# Patient Record
Sex: Female | Born: 1950
Health system: Southern US, Community
[De-identification: ages and names within clinical notes are randomized; demographics above are authoritative.]

## PROBLEM LIST (undated history)

## (undated) DIAGNOSIS — K5900 Constipation, unspecified: Secondary | ICD-10-CM

## (undated) DIAGNOSIS — R350 Frequency of micturition: Secondary | ICD-10-CM

## (undated) DIAGNOSIS — K219 Gastro-esophageal reflux disease without esophagitis: Secondary | ICD-10-CM

## (undated) DIAGNOSIS — R3915 Urgency of urination: Secondary | ICD-10-CM

## (undated) DIAGNOSIS — Z951 Presence of aortocoronary bypass graft: Secondary | ICD-10-CM

## (undated) DIAGNOSIS — R351 Nocturia: Secondary | ICD-10-CM

## (undated) DIAGNOSIS — G4730 Sleep apnea, unspecified: Secondary | ICD-10-CM

## (undated) DIAGNOSIS — I671 Cerebral aneurysm, nonruptured: Secondary | ICD-10-CM

## (undated) DIAGNOSIS — I639 Cerebral infarction, unspecified: Secondary | ICD-10-CM

## (undated) DIAGNOSIS — Z87442 Personal history of urinary calculi: Secondary | ICD-10-CM

## (undated) DIAGNOSIS — E785 Hyperlipidemia, unspecified: Secondary | ICD-10-CM

## (undated) DIAGNOSIS — H269 Unspecified cataract: Secondary | ICD-10-CM

## (undated) DIAGNOSIS — I5189 Other ill-defined heart diseases: Secondary | ICD-10-CM

## (undated) DIAGNOSIS — Z860100 Personal history of colon polyps, unspecified: Secondary | ICD-10-CM

## (undated) DIAGNOSIS — G4700 Insomnia, unspecified: Secondary | ICD-10-CM

## (undated) DIAGNOSIS — D151 Benign neoplasm of heart: Secondary | ICD-10-CM

## (undated) DIAGNOSIS — IMO0001 Reserved for inherently not codable concepts without codable children: Secondary | ICD-10-CM

## (undated) DIAGNOSIS — I2510 Atherosclerotic heart disease of native coronary artery without angina pectoris: Secondary | ICD-10-CM

## (undated) DIAGNOSIS — R202 Paresthesia of skin: Secondary | ICD-10-CM

## (undated) DIAGNOSIS — I10 Essential (primary) hypertension: Secondary | ICD-10-CM

## (undated) HISTORY — PX: ABDOMINAL HYSTERECTOMY: SHX81

## (undated) HISTORY — PX: COLONOSCOPY: SHX174

---

## 2006-03-13 ENCOUNTER — Other Ambulatory Visit: Admission: RE | Admit: 2006-03-13 | Discharge: 2006-03-13 | Payer: Self-pay | Admitting: Family Medicine

## 2014-07-01 ENCOUNTER — Encounter (HOSPITAL_COMMUNITY): Payer: Self-pay | Admitting: Neurology

## 2014-07-01 ENCOUNTER — Inpatient Hospital Stay (HOSPITAL_COMMUNITY): Payer: BC Managed Care – PPO

## 2014-07-01 ENCOUNTER — Inpatient Hospital Stay (HOSPITAL_COMMUNITY)
Admission: EM | Admit: 2014-07-01 | Discharge: 2014-07-14 | DRG: 982 | Disposition: A | Discharge status: Home / Self Care | Payer: BC Managed Care – PPO | Attending: Thoracic Surgery (Cardiothoracic Vascular Surgery) | Admitting: Thoracic Surgery (Cardiothoracic Vascular Surgery)

## 2014-07-01 ENCOUNTER — Emergency Department (HOSPITAL_COMMUNITY): Payer: BC Managed Care – PPO

## 2014-07-01 DIAGNOSIS — I63419 Cerebral infarction due to embolism of unspecified middle cerebral artery: Principal | ICD-10-CM | POA: Diagnosis present

## 2014-07-01 DIAGNOSIS — I2510 Atherosclerotic heart disease of native coronary artery without angina pectoris: Secondary | ICD-10-CM

## 2014-07-01 DIAGNOSIS — I10 Essential (primary) hypertension: Secondary | ICD-10-CM | POA: Diagnosis present

## 2014-07-01 DIAGNOSIS — D219 Benign neoplasm of connective and other soft tissue, unspecified: Secondary | ICD-10-CM

## 2014-07-01 DIAGNOSIS — I5189 Other ill-defined heart diseases: Secondary | ICD-10-CM | POA: Diagnosis present

## 2014-07-01 DIAGNOSIS — Q211 Atrial septal defect: Secondary | ICD-10-CM

## 2014-07-01 DIAGNOSIS — I700 Atherosclerosis of aorta: Secondary | ICD-10-CM | POA: Diagnosis present

## 2014-07-01 DIAGNOSIS — I671 Cerebral aneurysm, nonruptured: Secondary | ICD-10-CM | POA: Diagnosis present

## 2014-07-01 DIAGNOSIS — Z720 Tobacco use: Secondary | ICD-10-CM | POA: Diagnosis not present

## 2014-07-01 DIAGNOSIS — E669 Obesity, unspecified: Secondary | ICD-10-CM | POA: Diagnosis present

## 2014-07-01 DIAGNOSIS — D62 Acute posthemorrhagic anemia: Secondary | ICD-10-CM | POA: Diagnosis not present

## 2014-07-01 DIAGNOSIS — J9811 Atelectasis: Secondary | ICD-10-CM

## 2014-07-01 DIAGNOSIS — Z7982 Long term (current) use of aspirin: Secondary | ICD-10-CM

## 2014-07-01 DIAGNOSIS — D151 Benign neoplasm of heart: Secondary | ICD-10-CM | POA: Diagnosis present

## 2014-07-01 DIAGNOSIS — Z01818 Encounter for other preprocedural examination: Secondary | ICD-10-CM

## 2014-07-01 DIAGNOSIS — IMO0002 Reserved for concepts with insufficient information to code with codable children: Secondary | ICD-10-CM

## 2014-07-01 DIAGNOSIS — R229 Localized swelling, mass and lump, unspecified: Secondary | ICD-10-CM

## 2014-07-01 DIAGNOSIS — Z951 Presence of aortocoronary bypass graft: Secondary | ICD-10-CM

## 2014-07-01 DIAGNOSIS — I639 Cerebral infarction, unspecified: Secondary | ICD-10-CM

## 2014-07-01 DIAGNOSIS — Z6833 Body mass index (BMI) 33.0-33.9, adult: Secondary | ICD-10-CM | POA: Diagnosis not present

## 2014-07-01 DIAGNOSIS — R938 Abnormal findings on diagnostic imaging of other specified body structures: Secondary | ICD-10-CM | POA: Diagnosis not present

## 2014-07-01 DIAGNOSIS — E8770 Fluid overload, unspecified: Secondary | ICD-10-CM | POA: Diagnosis not present

## 2014-07-01 DIAGNOSIS — Z01810 Encounter for preprocedural cardiovascular examination: Secondary | ICD-10-CM

## 2014-07-01 DIAGNOSIS — E785 Hyperlipidemia, unspecified: Secondary | ICD-10-CM | POA: Diagnosis present

## 2014-07-01 DIAGNOSIS — R918 Other nonspecific abnormal finding of lung field: Secondary | ICD-10-CM

## 2014-07-01 DIAGNOSIS — R7309 Other abnormal glucose: Secondary | ICD-10-CM | POA: Diagnosis present

## 2014-07-01 DIAGNOSIS — R531 Weakness: Secondary | ICD-10-CM | POA: Diagnosis present

## 2014-07-01 DIAGNOSIS — I6789 Other cerebrovascular disease: Secondary | ICD-10-CM | POA: Diagnosis not present

## 2014-07-01 DIAGNOSIS — R7303 Prediabetes: Secondary | ICD-10-CM | POA: Diagnosis present

## 2014-07-01 DIAGNOSIS — F17210 Nicotine dependence, cigarettes, uncomplicated: Secondary | ICD-10-CM | POA: Diagnosis present

## 2014-07-01 DIAGNOSIS — R9389 Abnormal findings on diagnostic imaging of other specified body structures: Secondary | ICD-10-CM | POA: Diagnosis present

## 2014-07-01 DIAGNOSIS — R222 Localized swelling, mass and lump, trunk: Secondary | ICD-10-CM | POA: Diagnosis not present

## 2014-07-01 DIAGNOSIS — I25110 Atherosclerotic heart disease of native coronary artery with unstable angina pectoris: Secondary | ICD-10-CM | POA: Diagnosis not present

## 2014-07-01 HISTORY — DX: Cerebral aneurysm, nonruptured: I67.1

## 2014-07-01 HISTORY — DX: Cerebral infarction, unspecified: I63.9

## 2014-07-01 HISTORY — DX: Hyperlipidemia, unspecified: E78.5

## 2014-07-01 HISTORY — DX: Other ill-defined heart diseases: I51.89

## 2014-07-01 HISTORY — DX: Essential (primary) hypertension: I10

## 2014-07-01 HISTORY — DX: Benign neoplasm of heart: D15.1

## 2014-07-01 HISTORY — DX: Atherosclerotic heart disease of native coronary artery without angina pectoris: I25.10

## 2014-07-01 HISTORY — DX: Presence of aortocoronary bypass graft: Z95.1

## 2014-07-01 LAB — DIFFERENTIAL
BASOS PCT: 1 % (ref 0–1)
Basophils Absolute: 0.1 10*3/uL (ref 0.0–0.1)
EOS PCT: 1 % (ref 0–5)
Eosinophils Absolute: 0.1 10*3/uL (ref 0.0–0.7)
Lymphocytes Relative: 24 % (ref 12–46)
Lymphs Abs: 2.5 10*3/uL (ref 0.7–4.0)
MONO ABS: 0.6 10*3/uL (ref 0.1–1.0)
MONOS PCT: 6 % (ref 3–12)
Neutro Abs: 7 10*3/uL (ref 1.7–7.7)
Neutrophils Relative %: 68 % (ref 43–77)

## 2014-07-01 LAB — COMPREHENSIVE METABOLIC PANEL
ALBUMIN: 3.6 g/dL (ref 3.5–5.2)
ALK PHOS: 77 U/L (ref 39–117)
ALT: 19 U/L (ref 0–35)
AST: 25 U/L (ref 0–37)
Anion gap: 9 (ref 5–15)
BILIRUBIN TOTAL: 0.8 mg/dL (ref 0.3–1.2)
BUN: 15 mg/dL (ref 6–23)
CHLORIDE: 101 mmol/L (ref 96–112)
CO2: 27 mmol/L (ref 19–32)
Calcium: 9.2 mg/dL (ref 8.4–10.5)
Creatinine, Ser: 0.73 mg/dL (ref 0.50–1.10)
GFR calc Af Amer: >90 mL/min (ref >90)
GFR calc non Af Amer: 88 mL/min — ABNORMAL LOW (ref >90)
GLUCOSE: 99 mg/dL (ref 70–99)
POTASSIUM: 4.3 mmol/L (ref 3.5–5.1)
SODIUM: 137 mmol/L (ref 135–145)
Total Protein: 6.4 g/dL (ref 6.0–8.3)

## 2014-07-01 LAB — CBC
HEMATOCRIT: 43.4 % (ref 36.0–46.0)
HEMOGLOBIN: 15.1 g/dL — AB (ref 12.0–15.0)
MCH: 32 pg (ref 26.0–34.0)
MCHC: 34.8 g/dL (ref 30.0–36.0)
MCV: 91.9 fL (ref 78.0–100.0)
Platelets: 321 10*3/uL (ref 150–400)
RBC: 4.72 MIL/uL (ref 3.87–5.11)
RDW: 13.6 % (ref 11.5–15.5)
WBC: 10.3 10*3/uL (ref 4.0–10.5)

## 2014-07-01 LAB — GLUCOSE, CAPILLARY: GLUCOSE-CAPILLARY: 178 mg/dL — AB (ref 70–99)

## 2014-07-01 LAB — URINALYSIS, ROUTINE W REFLEX MICROSCOPIC
Bilirubin Urine: NEGATIVE
GLUCOSE, UA: NEGATIVE mg/dL
HGB URINE DIPSTICK: NEGATIVE
Ketones, ur: NEGATIVE mg/dL
LEUKOCYTES UA: NEGATIVE
NITRITE: NEGATIVE
PROTEIN: NEGATIVE mg/dL
Specific Gravity, Urine: 1.012 (ref 1.005–1.030)
UROBILINOGEN UA: 0.2 mg/dL (ref 0.0–1.0)
pH: 6 (ref 5.0–8.0)

## 2014-07-01 LAB — APTT: APTT: 30 s (ref 24–37)

## 2014-07-01 LAB — RAPID URINE DRUG SCREEN, HOSP PERFORMED
Amphetamines: NOT DETECTED
BARBITURATES: NOT DETECTED
Benzodiazepines: NOT DETECTED
Cocaine: NOT DETECTED
OPIATES: NOT DETECTED
TETRAHYDROCANNABINOL: NOT DETECTED

## 2014-07-01 LAB — PROTIME-INR
INR: 0.95 (ref 0.00–1.49)
Prothrombin Time: 12.8 seconds (ref 11.6–15.2)

## 2014-07-01 LAB — TROPONIN I: Troponin I: <0.03 ng/mL (ref <0.031)

## 2014-07-01 LAB — ETHANOL

## 2014-07-01 MED ORDER — SENNOSIDES-DOCUSATE SODIUM 8.6-50 MG PO TABS
1.0000 | ORAL_TABLET | Freq: Every day | ORAL | Status: DC
  Administered 2014-07-03 – 2014-07-09 (×6): 1 via ORAL
  Filled 2014-07-01 (×9): qty 1

## 2014-07-01 MED ORDER — STROKE: EARLY STAGES OF RECOVERY BOOK
Freq: Once | Status: DC
  Filled 2014-07-01: qty 1

## 2014-07-01 MED ORDER — ENOXAPARIN SODIUM 40 MG/0.4ML ~~LOC~~ SOLN
40.0000 mg | SUBCUTANEOUS | Status: DC
  Administered 2014-07-01 – 2014-07-04 (×4): 40 mg via SUBCUTANEOUS
  Filled 2014-07-01 (×5): qty 0.4

## 2014-07-01 MED ORDER — SODIUM CHLORIDE 0.9 % IV SOLN
INTRAVENOUS | Status: DC
  Administered 2014-07-01: 14:00:00 via INTRAVENOUS

## 2014-07-01 MED ORDER — ACETAMINOPHEN 325 MG PO TABS: 650.0000 mg | ORAL_TABLET | Freq: Four times a day (QID) | ORAL | Status: DC | PRN

## 2014-07-01 MED ORDER — ALBUTEROL SULFATE (2.5 MG/3ML) 0.083% IN NEBU
2.5000 mg | INHALATION_SOLUTION | RESPIRATORY_TRACT | Status: DC | PRN
  Administered 2014-07-07: 2.5 mg via RESPIRATORY_TRACT

## 2014-07-01 MED ORDER — ONDANSETRON HCL 4 MG/2ML IJ SOLN: 4.0000 mg | Freq: Four times a day (QID) | INTRAMUSCULAR | Status: DC | PRN

## 2014-07-01 MED ORDER — ASPIRIN 325 MG PO TABS
325.0000 mg | ORAL_TABLET | Freq: Every day | ORAL | Status: DC
  Administered 2014-07-01 – 2014-07-06 (×5): 325 mg via ORAL
  Filled 2014-07-01 (×5): qty 1

## 2014-07-01 MED ORDER — AMLODIPINE BESYLATE 2.5 MG PO TABS
2.5000 mg | ORAL_TABLET | Freq: Every day | ORAL | Status: DC
  Administered 2014-07-02 – 2014-07-06 (×5): 2.5 mg via ORAL
  Filled 2014-07-01 (×5): qty 1

## 2014-07-01 MED ORDER — HYDRALAZINE HCL 20 MG/ML IJ SOLN: 10.0000 mg | Freq: Four times a day (QID) | INTRAMUSCULAR | Status: DC | PRN

## 2014-07-01 MED ORDER — ASPIRIN 300 MG RE SUPP: 300.0000 mg | Freq: Every day | RECTAL | Status: DC

## 2014-07-01 MED ORDER — OXYBUTYNIN CHLORIDE ER 10 MG PO TB24
10.0000 mg | ORAL_TABLET | Freq: Every day | ORAL | Status: DC
  Administered 2014-07-02 – 2014-07-09 (×9): 10 mg via ORAL
  Filled 2014-07-01 (×13): qty 1

## 2014-07-01 NOTE — ED Notes (Signed)
Neuro at bedside.

## 2014-07-01 NOTE — H&P (Signed)
[facility] History and Physical                                                                                    Hannah Nielsen, is a 64 y.o. female  MRN: [id number]   DOB - January 06, 1951  Admit Date - 07/01/2014  Outpatient Primary MD for the patient is No primary care provider on file.  With History of -  Past Medical History  Diagnosis Date  . Hypertension       Past Surgical History  Procedure Laterality Date  . Abdominal hysterectomy      in for   Chief Complaint  Patient presents with  . Numbness     HPI  Hannah Nielsen  is a 64 y.o. female, with a history of hypertension and tobacco abuse presented to the ED with left sided upper extremity weakness and numbness that started this morning at 2am. Patient went to bed at 10pm with no symptoms and woke at 2am with left arm numbness. She attributed it to sleeping on it and went back to bed. She woke again at 4:30 with numbness and now could not grasp objects. She drove herself to the ED. She denies any changes in mood. Denies vision changes, chest pain, shortness of breath, nausea, vomiting, paresthesia in the lower extremity or right upper extremity, and headache.   In the ED, MRI of head showed small area of acute infarct on right precentral cortex over convexity WITHOUT hemorrhage. UA and UDS negative. Negative troponin. NSR on EKG. States that her numbness is now localized to the 4th and 5th left fingers and can now grasp objects. Strength appears to be close to baseline.   Review of Systems   In addition to the HPI above,  Review of Systems  Constitutional: Negative for chills.  Eyes: Negative for blurred vision and double vision.  Respiratory: Negative for cough.        Chronic dyspnea on exertion   Cardiovascular: Negative for chest pain and palpitations.  Gastrointestinal: Negative for nausea, vomiting, abdominal pain and diarrhea.  Musculoskeletal: Negative for myalgias.  Neurological: Positive for tingling  (left upper extremity (especially in fingers 4 and 5)) and weakness (left upper extremity). Negative for dizziness, tremors, speech change, loss of consciousness and headaches.    A full 10 point Review of Systems was done, except as stated above, all other Review of Systems were negative.  Social History History  Substance Use Topics  . Smoking status: Current Every Day Smoker  . Smokeless tobacco: Not on file  . Alcohol Use: Yes    Family History Family History  Problem Relation Age of Onset  . Hypertension Mother   . Heart failure Mother     Deceased  . Hypertension Sister   . Hypertension Brother   . Cerebral aneurysm Father 39    Deceased    Prior to Admission medications   Medication Sig Start Date End Date Taking? Authorizing Provider  acetaminophen (TYLENOL) 500 MG tablet Take 500-1,000 mg by mouth every 6 (six) hours as needed for mild pain or moderate pain.   Yes Historical Provider, MD  amLODipine (NORVASC) 2.5 MG tablet Take 2.5  mg by mouth at bedtime.    Yes Historical Provider, MD  aspirin 81 MG tablet Take 81 mg by mouth daily.   Yes Historical Provider, MD  CALCIUM PO Take 1 tablet by mouth daily.   Yes Historical Provider, MD  cetirizine (ZYRTEC) 10 MG tablet Take 10 mg by mouth at bedtime.   Yes Historical Provider, MD  hydrochlorothiazide (MICROZIDE) 12.5 MG capsule Take 12.5 mg by mouth daily.   Yes Historical Provider, MD  KRILL OIL PO Take 1 tablet by mouth daily.   Yes Historical Provider, MD  lisinopril (PRINIVIL,ZESTRIL) 10 MG tablet Take 10 mg by mouth at bedtime.    Yes Historical Provider, MD  LUTEIN PO Take 1 tablet by mouth daily.   Yes Historical Provider, MD  Multiple Vitamins-Minerals (MULTIVITAL) tablet Take 1 tablet by mouth daily.   Yes Historical Provider, MD  oxybutynin (DITROPAN-XL) 10 MG 24 hr tablet Take 10 mg by mouth at bedtime.   Yes Historical Provider, MD    Allergies  Allergen Reactions  . Sulfa Antibiotics     Physical  Exam  Vitals  Blood pressure 121/67, pulse 74, temperature 97.9 F (36.6 C), temperature source Oral, resp. rate 23, weight 83.915 kg (185 lb), SpO2 93 %.   General: Well developed, well nourished, NAD, appears stated age  24:  PERRLA, EOMI, Anicteic Sclera, MMM. No pharyngeal erythema or exudates  Neck: Supple, no JVD, no masses  Cardiovascular: RRR, S1 S2 auscultated, no rubs, murmurs or gallops.   Respiratory: Clear to auscultation bilaterally with equal chest rise  Abdomen: Soft, nontender, nondistended, + bowel sounds  Extremities: warm dry without cyanosis clubbing or edema.  Neuro: AAOx3, cranial nerves grossly intact. Strength 5/5 in B/L upper and B/L lower extremities. No pronator drift. Good coordination with finger, nose, finger test. No ataxia with point to point test.  Skin: Without rashes exudates or nodules.   Psych: Normal affect and demeanor with intact judgement and insight  Data Review  CBC  Recent Labs Lab 07/01/14 0810  WBC 10.3  HGB 15.1*  HCT 43.4  PLT 321  MCV 91.9  MCH 32.0  MCHC 34.8  RDW 13.6  LYMPHSABS 2.5  MONOABS 0.6  EOSABS 0.1  BASOSABS 0.1    Chemistries   Recent Labs Lab 07/01/14 0810  NA 137  K 4.3  CL 101  CO2 27  GLUCOSE 99  BUN 15  CREATININE 0.73  CALCIUM 9.2  AST 25  ALT 19  ALKPHOS 77  BILITOT 0.8    Coagulation profile  Recent Labs Lab 07/01/14 0810  INR 0.95    Cardiac Enzymes  Recent Labs Lab 07/01/14 0810  TROPONINI <0.03    Invalid input(s): POCBNP  Urinalysis    Component Value Date/Time   COLORURINE YELLOW 07/01/2014 0821   APPEARANCEUR CLEAR 07/01/2014 0821   LABSPEC 1.012 07/01/2014 0821   PHURINE 6.0 07/01/2014 0821   GLUCOSEU NEGATIVE 07/01/2014 0821   HGBUR NEGATIVE 07/01/2014 0821   BILIRUBINUR NEGATIVE 07/01/2014 0821   KETONESUR NEGATIVE 07/01/2014 0821   PROTEINUR NEGATIVE 07/01/2014 0821   UROBILINOGEN 0.2 07/01/2014 0821   NITRITE NEGATIVE 07/01/2014 0821    LEUKOCYTESUR NEGATIVE 07/01/2014 8115    Imaging results:   Mr Brain Wo Contrast  07/01/2014   CLINICAL DATA:  Left arm weakness and tingling began 2 a.m. today.  EXAM: MRI HEAD WITHOUT CONTRAST  TECHNIQUE: Multiplanar, multiecho pulse sequences of the brain and surrounding structures were obtained without intravenous contrast.  COMPARISON:  None.  FINDINGS: Small area of acute infarct in the right precentral cortex over the convexity. This appears to be in the precentral cortex with scattered small areas of patchy restricted diffusion. No other acute infarct.  Ventricle size normal.  Cerebral volume normal.  Negative for intracranial hemorrhage.  Negative for mass or edema.  Paranasal sinuses are clear.  Enlargement of the sella which is filled with CSF. Possible arachnoid cyst versus empty sella. Pituitary normal in size.  IMPRESSION: Small area of acute infarct right precentral cortex over the convexity. Negative for hemorrhage.   Electronically Signed   By: Franchot Gallo M.D.   On: 07/01/2014 10:00    My personal review of EKG: NSR, No ST changes noted.   Assessment & Plan  Principal Problem:   Stroke Active Problems:   Hypertension   Tobacco abuse  1. Acute CVA  - Symptoms started this morning at 2am; have significantly improved since coming to the ED - MRI of head showed small area of acute infarct on right precentral cortex over convexity WITHOUT hemorrhage.  - Will allow for permissive hypertension, monitor, hold home meds, restart amlodipine tomorrow. - Neurology consulted, appreciate input  - Continue neuro checks and monitor vital signs  - Obtain CXR, CT, carotid duplex, 2D ECHO  - OT, PT, SLP  - Increased ASA from 81mg  to 325mg   - After RN swallow screen, may advance diet  - Follow stroke protocol  2. Hypertension  - Will hold home medications today to allow for permissive hypertension up to SBP 180.  - Restart amlodipine tomorrow; continue holding lisinopril and  HCTZ. - Obtain lipid panel  - Hydralazine if SBP >200  3. Tobacco abuse - 51 pack year history.  - Counseling given; will consider quitting after hospitalization.  - Informed to not take her lozenges due to potential for vasospasm.   DVT Prophylaxis: Lovenox  AM Labs Ordered, also please review Full Orders  Family Communication: Discussed with patient    Code Status:  Full   Condition:  Stable  Time spent in minutes : [place], PA-S [facility] 07/01/2014, 11:18 AM After 7pm go to [email or web address] - password [email or web address]  And look for the night coverage person covering me after hours  [facility]  Attending Patient was seen, examined,treatment plan was discussed with the  PA-S.  I have directly reviewed the clinical findings, lab, imaging studies and management of this patient in detail. I have made the necessary changes to the above noted documentation, and agree with the documentation, as recorded by the PA-S.  In short, 64 year old female with history of hypertension and tobacco abuse, woke up this morning with left arm tingling/numbness and some mild weakness of her left hand-couldn't hold onto a phone. She thought that she had slept on her left hand and went back to sleep, when she awoke at 4 AM, she continued to have the symptoms. She subsequently presented to the emergency room, MRI of the brain was positive for small CVA. While in the emergency room, almost all of her deficits have now resolved. On exam she has 5/5 strength all over. Neurology has been consulted, she was already on 81 mg of aspirin prior to admission. She will be admitted for stroke workup, echo, carotids, A1c, lipid panel has been ordered. We will increase aspirin to 325 mg and await further recommendations from neurology.  Nena Alexander MD [facility].

## 2014-07-01 NOTE — Consult Note (Signed)
Referring Physician: Ghimire    Chief Complaint: stroke  HPI:                                                                                                                                         Hannah Nielsen is an 64 y.o. female with history of HTN and tobacco abuse. patient went to sleep last night and awoke at around 2 AM.  She states she realized her left hand felt numb and clumsy.  She went back to sleep and upon waking her left hand continued to have decreased sensation and feel clumsy. She drove herself to ED and MRI was obtained showing a right precentral cortex infarct. Currently she has clumsiness of her left hand and decreased sensation oover the small and ring finger.   Date last known well: Date: 06/30/2014 Time last known well: Unable to determine tPA Given: No: out of the window Modified Rankin: Rankin Score=0    Past Medical History  Diagnosis Date  . Hypertension     Past Surgical History  Procedure Laterality Date  . Abdominal hysterectomy      Family History  Problem Relation Age of Onset  . Hypertension Mother   . Heart failure Mother     Deceased  . Hypertension Sister   . Hypertension Brother   . Cerebral aneurysm Father 56    Deceased   Social History:  reports that she has been smoking.  She does not have any smokeless tobacco history on file. She reports that she drinks alcohol. She reports that she does not use illicit drugs.  Allergies:  Allergies  Allergen Reactions  . Sulfa Antibiotics     Medications:                                                                                                                           Current Facility-Administered Medications  Medication Dose Route Frequency Provider Last Rate Last Dose  .  stroke: mapping our early stages of recovery book   Does not apply Once Jonetta Osgood, MD      . 0.9 %  sodium chloride infusion   Intravenous Continuous Jonetta Osgood, MD 50 mL/hr at 07/01/14 1356    .  acetaminophen (TYLENOL) tablet 650 mg  650 mg Oral Q6H PRN Shanker Kristeen Mans, MD      . albuterol (PROVENTIL) (  2.5 MG/3ML) 0.083% nebulizer solution 2.5 mg  2.5 mg Nebulization Q2H PRN Jonetta Osgood, MD      . Derrill Memo ON 07/02/2014] amLODipine (NORVASC) tablet 2.5 mg  2.5 mg Oral QHS Shanker Kristeen Mans, MD      . aspirin suppository 300 mg  300 mg Rectal Daily Shanker Kristeen Mans, MD       Or  . aspirin tablet 325 mg  325 mg Oral Daily Jonetta Osgood, MD   325 mg at 07/01/14 1355  . enoxaparin (LOVENOX) injection 40 mg  40 mg Subcutaneous Q24H Shanker Kristeen Mans, MD      . hydrALAZINE (APRESOLINE) injection 10 mg  10 mg Intravenous Q6H PRN Shanker Kristeen Mans, MD      . ondansetron (ZOFRAN) injection 4 mg  4 mg Intravenous Q6H PRN Shanker Kristeen Mans, MD      . oxybutynin (DITROPAN-XL) 24 hr tablet 10 mg  10 mg Oral QHS Shanker Kristeen Mans, MD      . senna-docusate (Senokot-S) tablet 1 tablet  1 tablet Oral QHS Shanker Kristeen Mans, MD       Current Outpatient Prescriptions  Medication Sig Dispense Refill  . acetaminophen (TYLENOL) 500 MG tablet Take 500-1,000 mg by mouth every 6 (six) hours as needed for mild pain or moderate pain.    Marland Kitchen amLODipine (NORVASC) 2.5 MG tablet Take 2.5 mg by mouth at bedtime.     Marland Kitchen aspirin 81 MG tablet Take 81 mg by mouth daily.    Marland Kitchen CALCIUM PO Take 1 tablet by mouth daily.    . cetirizine (ZYRTEC) 10 MG tablet Take 10 mg by mouth at bedtime.    . hydrochlorothiazide (MICROZIDE) 12.5 MG capsule Take 12.5 mg by mouth daily.    Marland Kitchen KRILL OIL PO Take 1 tablet by mouth daily.    Marland Kitchen lisinopril (PRINIVIL,ZESTRIL) 10 MG tablet Take 10 mg by mouth at bedtime.     . LUTEIN PO Take 1 tablet by mouth daily.    . Multiple Vitamins-Minerals (MULTIVITAL) tablet Take 1 tablet by mouth daily.    Marland Kitchen oxybutynin (DITROPAN-XL) 10 MG 24 hr tablet Take 10 mg by mouth at bedtime.       ROS:                                                                                                                                        History obtained from the patient  General ROS: negative for - chills, fatigue, fever, night sweats, weight gain or weight loss Psychological ROS: negative for - behavioral disorder, hallucinations, memory difficulties, mood swings or suicidal ideation Ophthalmic ROS: negative for - blurry vision, double vision, eye pain or loss of vision ENT ROS: negative for - epistaxis, nasal discharge, oral lesions, sore throat, tinnitus or vertigo Allergy and Immunology ROS: negative for - hives or itchy/watery eyes Hematological and Lymphatic ROS: negative for -  bleeding problems, bruising or swollen lymph nodes Endocrine ROS: negative for - galactorrhea, hair pattern changes, polydipsia/polyuria or temperature intolerance Respiratory ROS: negative for - cough, hemoptysis, shortness of breath or wheezing Cardiovascular ROS: negative for - chest pain, dyspnea on exertion, edema or irregular heartbeat Gastrointestinal ROS: negative for - abdominal pain, diarrhea, hematemesis, nausea/vomiting or stool incontinence Genito-Urinary ROS: negative for - dysuria, hematuria, incontinence or urinary frequency/urgency Musculoskeletal ROS: negative for - joint swelling or muscular weakness Neurological ROS: as noted in HPI Dermatological ROS: negative for rash and skin lesion changes  Neurologic Examination:                                                                                                      Blood pressure 133/78, pulse 75, temperature 97.9 F (36.6 C), temperature source Oral, resp. rate 21, weight 83.915 kg (185 lb), SpO2 93 %.  HEENT-  Normocephalic, no lesions, without obvious abnormality.  Normal external eye and conjunctiva.  Normal TM's bilaterally.  Normal auditory canals and external ears. Normal external nose, mucus membranes and septum.  Normal pharynx. Cardiovascular- S1, S2 normal, pulses palpable throughout   Lungs- chest clear, no wheezing, rales, normal  symmetric air entry Abdomen- normal findings: bowel sounds normal Extremities- no edema Lymph-no adenopathy palpable Musculoskeletal-no joint tenderness, deformity or swelling Skin-warm and dry, no hyperpigmentation, vitiligo, or suspicious lesions  Neurological Examination Mental Status: Alert, oriented, thought content appropriate.  Speech fluent without evidence of aphasia.  Able to follow 3 step commands without difficulty. Cranial Nerves: II: Discs flat bilaterally; Visual fields grossly normal, pupils equal, round, reactive to light and accommodation III,IV, VI: ptosis not present, extra-ocular motions intact bilaterally V,VII: smile symmetric with slight left NL fold decrease, facial light touch sensation normal bilaterally VIII: hearing normal bilaterally IX,X: uvula rises symmetrically XI: bilateral shoulder shrug XII: midline tongue extension Motor: Right : Upper extremity   5/5    Left:     Upper extremity   5/5--grip 4/5  Lower extremity   5/5     Lower extremity   5/5 Decreased  RAM on the left hand Tone and bulk:normal tone throughout; no atrophy noted Sensory: Pinprick and light touch intact throughout, bilaterally Deep Tendon Reflexes: 2+ and symmetric throughout Plantars: Right: downgoing   Left: downgoing Cerebellar: normal finger-to-nose,  normal heel-to-shin test Gait: not assessed due to multiple leads.        Lab Results: Basic Metabolic Panel:  Recent Labs Lab 07/01/14 0810  NA 137  K 4.3  CL 101  CO2 27  GLUCOSE 99  BUN 15  CREATININE 0.73  CALCIUM 9.2    Liver Function Tests:  Recent Labs Lab 07/01/14 0810  AST 25  ALT 19  ALKPHOS 77  BILITOT 0.8  PROT 6.4  ALBUMIN 3.6   No results for input(s): LIPASE, AMYLASE in the last 168 hours. No results for input(s): AMMONIA in the last 168 hours.  CBC:  Recent Labs Lab 07/01/14 0810  WBC 10.3  NEUTROABS 7.0  HGB 15.1*  HCT 43.4  MCV 91.9  PLT 321  Cardiac  Enzymes:  Recent Labs Lab 07/01/14 0810  TROPONINI <0.03    Lipid Panel: No results for input(s): CHOL, TRIG, HDL, CHOLHDL, VLDL, LDLCALC in the last 168 hours.  CBG: No results for input(s): GLUCAP in the last 168 hours.  Microbiology: No results found for this or any previous visit.  Coagulation Studies:  Recent Labs  07/01/14 0810  LABPROT 12.8  INR 0.95    Imaging: Mr Brain Wo Contrast  07/01/2014   CLINICAL DATA:  Left arm weakness and tingling began 2 a.m. today.  EXAM: MRI HEAD WITHOUT CONTRAST  TECHNIQUE: Multiplanar, multiecho pulse sequences of the brain and surrounding structures were obtained without intravenous contrast.  COMPARISON:  None.  FINDINGS: Small area of acute infarct in the right precentral cortex over the convexity. This appears to be in the precentral cortex with scattered small areas of patchy restricted diffusion. No other acute infarct.  Ventricle size normal.  Cerebral volume normal.  Negative for intracranial hemorrhage.  Negative for mass or edema.  Paranasal sinuses are clear.  Enlargement of the sella which is filled with CSF. Possible arachnoid cyst versus empty sella. Pituitary normal in size.  IMPRESSION: Small area of acute infarct right precentral cortex over the convexity. Negative for hemorrhage.   Electronically Signed   By: Franchot Gallo M.D.   On: 07/01/2014 10:00    Hannah Rayos PA-C [facility] [phone]  07/01/2014, 2:45 PM   Assessment: 64 y.o. female presenting to ED with left hand clumsiness decreased sensation.  MRI shows acute right brain infarct.    Stroke Risk Factors - hypertension and smoking   Recommend: 1. HgbA1c, fasting lipid panel 2. MRI, MRA  of the brain without contrast 3. PT consult, OT consult, Speech consult 4. Echocardiogram 5. Carotid dopplers 6. Prophylactic therapy-Antiplatelet med: Aspirin - dose 325 mg daily 7. Risk factor modification 8. Telemetry monitoring 9. Frequent neuro  checks 10 NPO until passes stroke swallow screen  I personally anticipated in this patient's evaluation and management, including formulating the above clinical impression and management recommendations.  Rush Farmer M.D. [facility] [phone]

## 2014-07-01 NOTE — ED Notes (Signed)
Pt reports went to bed last night at 10 pm, woke up at 2 am with left arm numbness and weakness. No other deficits. Denies pain. Is ambulatory. No arm drift. Speech clear and concise.

## 2014-07-01 NOTE — ED Provider Notes (Signed)
CSN: [id number]     Arrival date & time 07/01/14  0726 History   First MD Initiated Contact with Patient 07/01/14 0732     Chief Complaint  Patient presents with  . Numbness      HPI Patient presents to the emergency department because of development of left arm weakness and numbness.  She states that when she went to bed at 10 PM she was without any complaints and had been in her normal state of health.  She had normal strength at that time.  When she awoke at 2 AM she noted the left arm weakness and numbness.  It is slightly improved since then.  She feels clumsy with her left hand.  She has no weakness of her lower extremities.  She denies neck pain.  No headache.  No difficulty speaking.  No prior history of stroke.  She does have a history of hypertension and tobacco abuse.  No other complaints.  No other significant medical problems.  Symptoms are mild in severity.   Past Medical History  Diagnosis Date  . Hypertension    Past Surgical History  Procedure Laterality Date  . Abdominal hysterectomy     No family history on file. History  Substance Use Topics  . Smoking status: Current Every Day Smoker  . Smokeless tobacco: Not on file  . Alcohol Use: Yes   OB History    No data available     Review of Systems  All other systems reviewed and are negative.     Allergies  Sulfa antibiotics  Home Medications   Prior to Admission medications   Not on File   BP 154/73 mmHg  Pulse 78  Temp(Src) 98.2 F (36.8 C) (Oral)  Resp 14  Wt 185 lb (83.915 kg)  SpO2 93% Physical Exam  Constitutional: She is oriented to person, place, and time. She appears well-developed and well-nourished. No distress.  HENT:  Head: Normocephalic and atraumatic.  Eyes: EOM are normal.  Neck: Normal range of motion.  Cardiovascular: Normal rate, regular rhythm and normal heart sounds.   Pulmonary/Chest: Effort normal and breath sounds normal.  Abdominal: Soft. She exhibits no distension.  There is no tenderness.  Musculoskeletal: Normal range of motion.  Neurological: She is alert and oriented to person, place, and time.  Mild weakness of the left upper extremity without drift.  Grip on the left is weak.  Abnormal finger to nose with the left hand.  Normal 5 out of 5 strength in the bilateral lower extremity major muscle groups in right upper extremity  Skin: Skin is warm and dry.  Psychiatric: She has a normal mood and affect. Judgment normal.  Nursing note and vitals reviewed.   ED Course  Procedures (including critical care time) Labs Review Labs Reviewed  CBC - Abnormal; Notable for the following:    Hemoglobin 15.1 (*)    All other components within normal limits  COMPREHENSIVE METABOLIC PANEL - Abnormal; Notable for the following:    GFR calc non Af Amer 88 (*)    All other components within normal limits  ETHANOL  PROTIME-INR  APTT  DIFFERENTIAL  URINE RAPID DRUG SCREEN (HOSP PERFORMED)  URINALYSIS, ROUTINE W REFLEX MICROSCOPIC  TROPONIN I    Imaging Review Mr Brain Wo Contrast  07/01/2014   CLINICAL DATA:  Left arm weakness and tingling began 2 a.m. today.  EXAM: MRI HEAD WITHOUT CONTRAST  TECHNIQUE: Multiplanar, multiecho pulse sequences of the brain and surrounding structures were obtained without  intravenous contrast.  COMPARISON:  None.  FINDINGS: Small area of acute infarct in the right precentral cortex over the convexity. This appears to be in the precentral cortex with scattered small areas of patchy restricted diffusion. No other acute infarct.  Ventricle size normal.  Cerebral volume normal.  Negative for intracranial hemorrhage.  Negative for mass or edema.  Paranasal sinuses are clear.  Enlargement of the sella which is filled with CSF. Possible arachnoid cyst versus empty sella. Pituitary normal in size.  IMPRESSION: Small area of acute infarct right precentral cortex over the convexity. Negative for hemorrhage.   Electronically Signed   By: Franchot Gallo M.D.   On: 07/01/2014 10:00     EKG Interpretation   Date/Time:  Tuesday July 01 2014 07:56:58 EDT Ventricular Rate:  77 PR Interval:  147 QRS Duration: 96 QT Interval:  365 QTC Calculation: 413 R Axis:   -26 Text Interpretation:  Sinus rhythm Borderline left axis deviation RSR' in  V1 or V2, right VCD or RVH No old tracing to compare Confirmed by Skylar Priest   MD, Shirell Struthers ([id number]) on 07/01/2014 8:32:44 AM      MDM   Final diagnoses:  None    Concerning for stroke.  MRI pending at this time.  Labs.  Could represent peripheral palsy.  We'll reevaluate after MRI results return.  No neck pain.  Doubt radiculopathy.  10:43 AM Admit for stroke.  [facility] Dr Nicole Kindred- Neurohospitalist    Jola Schmidt, MD 07/01/14 [phone]

## 2014-07-01 NOTE — ED Notes (Signed)
Patient transported to MRI 

## 2014-07-01 NOTE — ED Notes (Signed)
Lunch tray ordered for patient.

## 2014-07-02 ENCOUNTER — Encounter (HOSPITAL_COMMUNITY): Payer: Self-pay | Admitting: Internal Medicine

## 2014-07-02 DIAGNOSIS — I671 Cerebral aneurysm, nonruptured: Secondary | ICD-10-CM | POA: Diagnosis present

## 2014-07-02 DIAGNOSIS — I6789 Other cerebrovascular disease: Secondary | ICD-10-CM

## 2014-07-02 DIAGNOSIS — R9389 Abnormal findings on diagnostic imaging of other specified body structures: Secondary | ICD-10-CM | POA: Diagnosis present

## 2014-07-02 DIAGNOSIS — E785 Hyperlipidemia, unspecified: Secondary | ICD-10-CM

## 2014-07-02 DIAGNOSIS — R938 Abnormal findings on diagnostic imaging of other specified body structures: Secondary | ICD-10-CM

## 2014-07-02 HISTORY — DX: Hyperlipidemia, unspecified: E78.5

## 2014-07-02 HISTORY — DX: Cerebral aneurysm, nonruptured: I67.1

## 2014-07-02 LAB — BASIC METABOLIC PANEL
Anion gap: 10 (ref 5–15)
BUN: 11 mg/dL (ref 6–23)
CO2: 23 mmol/L (ref 19–32)
Calcium: 8.9 mg/dL (ref 8.4–10.5)
Chloride: 107 mmol/L (ref 96–112)
Creatinine, Ser: 0.58 mg/dL (ref 0.50–1.10)
GFR calc Af Amer: >90 mL/min (ref >90)
GFR calc non Af Amer: >90 mL/min (ref >90)
GLUCOSE: 75 mg/dL (ref 70–99)
POTASSIUM: 4.1 mmol/L (ref 3.5–5.1)
Sodium: 140 mmol/L (ref 135–145)

## 2014-07-02 LAB — GLUCOSE, CAPILLARY: Glucose-Capillary: 106 mg/dL — ABNORMAL HIGH (ref 70–99)

## 2014-07-02 LAB — CBC
HEMATOCRIT: 43.8 % (ref 36.0–46.0)
HEMOGLOBIN: 14.5 g/dL (ref 12.0–15.0)
MCH: 31 pg (ref 26.0–34.0)
MCHC: 33.1 g/dL (ref 30.0–36.0)
MCV: 93.6 fL (ref 78.0–100.0)
Platelets: 309 10*3/uL (ref 150–400)
RBC: 4.68 MIL/uL (ref 3.87–5.11)
RDW: 13.7 % (ref 11.5–15.5)
WBC: 9.3 10*3/uL (ref 4.0–10.5)

## 2014-07-02 LAB — LIPID PANEL
CHOLESTEROL: 197 mg/dL (ref 0–200)
HDL: 35 mg/dL — AB (ref >39)
LDL Cholesterol: 112 mg/dL — ABNORMAL HIGH (ref 0–99)
TRIGLYCERIDES: 249 mg/dL — AB (ref <150)
Total CHOL/HDL Ratio: 5.6 RATIO
VLDL: 50 mg/dL — ABNORMAL HIGH (ref 0–40)

## 2014-07-02 MED ORDER — LORATADINE 10 MG PO TABS
10.0000 mg | ORAL_TABLET | Freq: Every day | ORAL | Status: DC
  Administered 2014-07-02 – 2014-07-09 (×8): 10 mg via ORAL
  Filled 2014-07-02 (×9): qty 1

## 2014-07-02 MED ORDER — ATORVASTATIN CALCIUM 20 MG PO TABS
20.0000 mg | ORAL_TABLET | Freq: Every day | ORAL | Status: DC
Start: 2014-07-02 — End: 2014-07-06
  Administered 2014-07-02 – 2014-07-05 (×4): 20 mg via ORAL
  Filled 2014-07-02: qty 1
  Filled 2014-07-02: qty 2
  Filled 2014-07-02: qty 1
  Filled 2014-07-02: qty 2

## 2014-07-02 MED ORDER — HYDROCHLOROTHIAZIDE 12.5 MG PO CAPS
12.5000 mg | ORAL_CAPSULE | Freq: Every day | ORAL | Status: DC
  Administered 2014-07-02 – 2014-07-05 (×4): 12.5 mg via ORAL
  Filled 2014-07-02 (×4): qty 1

## 2014-07-02 MED ORDER — MENTHOL 3 MG MT LOZG
1.0000 | LOZENGE | OROMUCOSAL | Status: DC | PRN
  Filled 2014-07-02: qty 9

## 2014-07-02 MED ORDER — LISINOPRIL 10 MG PO TABS
10.0000 mg | ORAL_TABLET | Freq: Every day | ORAL | Status: DC
  Administered 2014-07-02 – 2014-07-06 (×5): 10 mg via ORAL
  Filled 2014-07-02 (×6): qty 1

## 2014-07-02 NOTE — Progress Notes (Signed)
UR complete.  Rasheen Schewe RN, MSN 

## 2014-07-02 NOTE — Progress Notes (Signed)
   07/02/14 1600  Clinical Encounter Type  Visited With Patient and family together  Visit Type Initial   Chaplain was referred to patient via spiritual care consult. Consult indicated patient would like to create or update an advanced directive. Chaplain visited with patient and patient's daughter. Patient was interested in completing an advanced directive and chaplain provided the forms. Patient plans on filling these out now. Chaplain will follow up later to facilitate completion of advanced directive.  Cameran Pettey, Claudius Sis, Chaplain  4:13 PM

## 2014-07-02 NOTE — Progress Notes (Signed)
Patient arrived to 4N11 at 1930 alert orient no complaint at this time.

## 2014-07-02 NOTE — Evaluation (Signed)
Occupational Therapy Evaluation Patient Details Name: Hannah Nielsen MRN: [id number] DOB: 09-Dec-1950 Today's Date: 07/02/2014    History of Present Illness Hannah Nielsen is an 64 y.o. female with history of HTN and tobacco abuse. patient went to sleep last night and awoke at around 2 AM. She states she realized her left hand felt numb and clumsy. She went back to sleep and upon waking her left hand continued to have decreased sensation and feel clumsy. She drove herself to ED and MRI was obtained showing a right precentral cortex infarct. Currently she has clumsiness of her left hand and decreased sensation oover the small and ring finger   Clinical Impression   Pt admitted with the above diagnoses and presents with below problem list. Pt will benefit from continued acute OT to address the below listed deficits and maximize independence with BADLs prior to d/c home. PTA pt was independent with ADLs and working part-time as a Oceanographer. Pt currently at supervision-independent with ADLs. OT to continue to follow acute to address residual deficits in LUE.      Follow Up Recommendations  Outpatient OT    Equipment Recommendations  None recommended by OT    Recommendations for Other Services       Precautions / Restrictions Precautions Precautions: Fall Restrictions Weight Bearing Restrictions: No      Mobility Bed Mobility Overal bed mobility: Modified Independent             General bed mobility comments: no assist or cues for supine<>EOB  Transfers Overall transfer level: Modified independent Equipment used: None             General transfer comment: no assist, no LOB    Balance Overall balance assessment: Needs assistance         Standing balance support: No upper extremity supported;During functional activity Standing balance-Leahy Scale: Good Standing balance comment: single extremity suport to steady self during dynamic standing tasks                             ADL Overall ADL's : Needs assistance/impaired Eating/Feeding: Set up;Sitting   Grooming: Supervision/safety;Cueing for compensatory techniques   Upper Body Bathing: Modified independent   Lower Body Bathing: Modified independent;Sit to/from stand   Upper Body Dressing : Modified independent;Sitting   Lower Body Dressing: Sit to/from stand;Modified independent   Toilet Transfer: Ambulation;Comfort height toilet;Modified Independent   Toileting- Clothing Manipulation and Hygiene: Modified independent   Tub/ Shower Transfer: Ambulation;Shower seat;Modified independent   Functional mobility during ADLs: Modified independent General ADL Comments: Pt ambulated to bathroom to complete oral care in standing with no assist and no LOB. Pt completed dynamic standing activity using a chair for single extremity support with no LOB. Edcuated on signs and symptoms of a stroke, using Lt hand during functional activities.      Vision Vision Assessment?: No apparent visual deficits   Perception     Praxis      Pertinent Vitals/Pain Pain Assessment: No/denies pain     Hand Dominance Right   Extremity/Trunk Assessment Upper Extremity Assessment Upper Extremity Assessment: LUE deficits/detail;Overall [facility] for tasks assessed LUE Deficits / Details: pt reports numbness on palmar side of 4th and 5th digits of lt hand. Able to grasp objects and open containers with no difficulty this session. Noted to have decreased speed in Lt hand during thumb-fingers touch  test.  LUE Sensation: decreased light touch LUE Coordination: decreased fine motor (functional,  decreased speed of coordination)   Lower Extremity Assessment Lower Extremity Assessment: Defer to PT evaluation       Communication Communication Communication: No difficulties   Cognition Arousal/Alertness: Awake/alert Behavior During Therapy: WFL for tasks assessed/performed Overall Cognitive Status:  Within Functional Limits for tasks assessed                     General Comments       Exercises       Shoulder Instructions      Home Living Family/patient expects to be discharged to:: Private residence Living Arrangements: Alone Available Help at Discharge: Family;Available PRN/intermittently Type of Home: Apartment Home Access: Level entry     Home Layout: One level     Bathroom Shower/Tub: Walk-in shower         Home Equipment: None   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, 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; drives      Prior Functioning/Environment Level of Independence: Independent             OT Diagnosis: Other (comment) (altered sesnsation, decreased coordination speed in Lt hand)   OT Problem List: Impaired balance (sitting and/or standing);Decreased coordination;Decreased knowledge of precautions;Impaired sensation   OT Treatment/Interventions: Self-care/ADL training;Therapeutic exercise;Therapeutic activities;Patient/family education;Balance training;DME and/or AE instruction    OT Goals(Current goals can be found in the care plan section) Acute Rehab OT Goals Patient Stated Goal: not stated OT Goal Formulation: With patient Time For Goal Achievement: 07/16/14 Potential to Achieve Goals: Good ADL Goals Pt Will Perform Grooming: Independently;standing Additional ADL Goal #1: Pt will be independent with HEP for increased coordination/strength of LUE.  OT Frequency: Min 2X/week   Barriers to D/C:            Co-evaluation              End of Session    Activity Tolerance: Patient tolerated treatment well Patient left: in bed;with call bell/phone within reach;with family/visitor present   Time: 0950-1004 OT Time Calculation (min): 14 min Charges:  OT General Charges $OT Visit: 1 Procedure OT Evaluation $Initial OT Evaluation Tier I: 1 Procedure G-Codes:    Hannah Nielsen 21-Jul-2014, 11:51 AM

## 2014-07-02 NOTE — Evaluation (Signed)
Physical Therapy Evaluation Patient Details Name: Hannah Nielsen MRN: [id number] DOB: 04/26/1950 Today's Date: 07/02/2014   History of Present Illness  Hannah Nielsen is an 64 y.o. female with history of HTN and tobacco abuse. patient went to sleep last night and awoke at around 2 AM. She states she realized her left hand felt numb and clumsy. She went back to sleep and upon waking her left hand continued to have decreased sensation and feel clumsy. She drove herself to ED and MRI was obtained showing a right precentral cortex infarct. Currently she has clumsiness of her left hand and decreased sensation oover the small and ring finger  Clinical Impression  Patient presents close to functional baseline and able to ambulate community distances while performing higher level balance challenges without difficulty or LOB. Encourage ambulation daily while in hospital. Pt does not require further skilled therapy services. Discharge from therapy.    Follow Up Recommendations No PT follow up;Supervision - Intermittent    Equipment Recommendations  None recommended by PT    Recommendations for Other Services       Precautions / Restrictions Precautions Precautions: None Restrictions Weight Bearing Restrictions: No      Mobility  Bed Mobility Overal bed mobility: Modified Independent             General bed mobility comments: no assist or cues for supine<>EOB  Transfers Overall transfer level: Independent Equipment used: None                Ambulation/Gait Ambulation/Gait assistance: Independent Ambulation Distance (Feet): 510 Feet Assistive device: None Gait Pattern/deviations: WFL(Within Functional Limits)   Gait velocity interpretation: at or above normal speed for age/gender General Gait Details: Pt with steady gait. Tolerated higher level balance activities without difficulty. See balance section.  Stairs            Wheelchair Mobility    Modified Rankin  (Stroke Patients Only) Modified Rankin (Stroke Patients Only) Pre-Morbid Rankin Score: No symptoms Modified Rankin: No significant disability     Balance Overall balance assessment: Needs assistance Sitting-balance support: Feet supported;No upper extremity supported Sitting balance-Leahy Scale: Good     Standing balance support: During functional activity Standing balance-Leahy Scale: Good Standing balance comment: Requires UE support to steady self whe pulling up sock in hallway.             High level balance activites: Side stepping;Backward walking;Direction changes;Turns;Sudden stops;Head turns High Level Balance Comments: Tolerated above activities wtihout LOB or difficulty. 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; drives    Prior Function Level of Independence: Independent               Hand Dominance   Dominant Hand: Right    Extremity/Trunk Assessment   Upper Extremity Assessment: Defer to OT evaluation           Lower Extremity Assessment: Overall WFL for tasks assessed      Cervical / Trunk Assessment: Normal  Communication   Communication: No difficulties  Cognition Arousal/Alertness: Awake/alert Behavior During Therapy: WFL for tasks assessed/performed Overall Cognitive  Status: Within Functional Limits for tasks assessed                      General Comments      Exercises        Assessment/Plan    PT Assessment Patent does not need any further PT services  PT Diagnosis     PT Problem  List    PT Treatment Interventions     PT Goals (Current goals can be found in the Care Plan section) Acute Rehab PT Goals Patient Stated Goal: to go home PT Goal Formulation: All assessment and education complete, DC therapy    Frequency     Barriers to discharge        Co-evaluation               End of Session Equipment Utilized During Treatment: Gait belt Activity Tolerance: Patient tolerated treatment well Patient left: in bed;with call bell/phone within reach Nurse Communication: Mobility status         Time: 1500-1511 PT Time Calculation (min) (ACUTE ONLY): 11 min   Charges:   PT Evaluation $Initial PT Evaluation Tier I: 1 Procedure     PT G CodesCandy Sledge A 07-24-2014, 3:52 PM Candy Sledge, [place], DPT [phone]

## 2014-07-02 NOTE — Progress Notes (Signed)
Preliminary Results: Carotid duplex completed. Bilateral ICA 1-39% stenosis with elevated velocities visualized in the bilateral ECAs. [place]

## 2014-07-02 NOTE — Progress Notes (Signed)
[facility] PROGRESS NOTE  Hannah Nielsen WNU:[id number] DOB: [birth year] DOA: 07/01/2014 PCP: [name], MD  Assessment/Plan: #1 acute CVA: Nondominant right high parietal cortical infarct embolic secondary to unknown source. Patient states left hand numbness clinically improving and close to baseline. MRI MRA of the head with right high parietal cortical infarct. 4.4 millimeter right A1/A2 aneurysm. Carotid Dopplers were done with no significant ICA stenosis. 2-D echo done with EF of 65-70%, no wall motion abnormalities, no cardiac source of emboli. Hemoglobin A1c is pending. Patient has been seen in consultation by neurology recommending a TEE to rule out embolic source. If TEE is positive for PFO will need bilateral lower extremity Dopplers to rule out DVT as a source of stroke. If TEE is negative original likely need placement of a implantable loop recorder to evaluate for atrial fibrillation as an etiology of stroke. Continue full dose aspirin for secondary stroke prevention. Risk factor modification. PT/OT.  #2 right ACA aneurysm Noted on MRA of the head which was 4.4 mm. Patient currently asymptomatic. Risk of rupture less than 1%.. Patient with family history of ruptured aneurysms in the past. Risk factor modification. Blood pressure management. Outpatient follow-up with neurosurgery for further evaluation and recommendations.  #3 hypertension Stable. Continue home regimen of Norvasc. Resume home regimen of lisinopril and HCTZ. Goal blood pressure systolic < 034.  #4 hyperlipidemia LDL of 112. Goal LDL less than 70. Patient has been started on low-dose statin. Outpatient follow-up.  #5 tobacco abuse Tobacco cessation.  #6 abnormal chest x-ray Outpatient CT chest for further evaluation. Patient currently asymptomatic.  #7 prophylaxis Lovenox for DVT prophylaxis.    Code Status: Full Family Communication: Updated patient and pastor at bedside. Disposition Plan:  Home when medically stable hopefully 1-2 days.   Consultants:  Neurology: Dr. Nicole Kindred 07/01/2014  Procedures:  2-D echo 07/02/2014  Carotid Dopplers 07/02/2014  Chest x-ray 07/01/2014  MRI/MRA head 07/01/2014  Antibiotics:  None  HPI/Subjective: Patient states numbness and tingling has improved significantly and close to her baseline. No complaints.  Objective: Filed Vitals:   07/02/14 1454  BP: 150/72  Pulse: 77  Temp: 98.2 F (36.8 C)  Resp: 18    Intake/Output Summary (Last 24 hours) at 07/02/14 1552 Last data filed at 07/02/14 1300  Gross per 24 hour  Intake    600 ml  Output      0 ml  Net    600 ml   Filed Weights   07/01/14 0733  Weight: 83.915 kg (185 lb)    Exam:   General:  NAD  Cardiovascular: RRR  Respiratory: CTAB  Abdomen: Soft/NT/ND/+BS  Musculoskeletal: No c/c/e  Data Reviewed: Basic Metabolic Panel:  Recent Labs Lab 07/01/14 0810 07/02/14 1136  NA 137 140  K 4.3 4.1  CL 101 107  CO2 27 23  GLUCOSE 99 75  BUN 15 11  CREATININE 0.73 0.58  CALCIUM 9.2 8.9   Liver Function Tests:  Recent Labs Lab 07/01/14 0810  AST 25  ALT 19  ALKPHOS 77  BILITOT 0.8  PROT 6.4  ALBUMIN 3.6   No results for input(s): LIPASE, AMYLASE in the last 168 hours. No results for input(s): AMMONIA in the last 168 hours. CBC:  Recent Labs Lab 07/01/14 0810 07/02/14 1136  WBC 10.3 9.3  NEUTROABS 7.0  --   HGB 15.1* 14.5  HCT 43.4 43.8  MCV 91.9 93.6  PLT 321 309   Cardiac Enzymes:  Recent Labs Lab 07/01/14 0810  TROPONINI <0.03  BNP (last 3 results) No results for input(s): BNP in the last 8760 hours.  ProBNP (last 3 results) No results for input(s): PROBNP in the last 8760 hours.  CBG:  Recent Labs Lab 07/01/14 2225 07/02/14 0645  GLUCAP 178* 106*    No results found for this or any previous visit (from the past 240 hour(s)).   Studies: Dg Chest 2 View  07/01/2014   CLINICAL DATA:  Stroke. Left hand  numbness. cough and congestion. Hypertension.  EXAM: CHEST  2 VIEW  COMPARISON:  None.  FINDINGS: Bandlike density in the left lower lobe and right middle lobe. Vague nodularity along the left basilar opacity. Atherosclerotic calcification of the aortic arch. Calcifications along the left hilum likely reflecting old granulomatous disease. Thoracic spondylosis is present.  IMPRESSION: 1. Bandlike opacities in the right middle lobe and left lower lobe, with some nodularity along the left lower lobe opacity. Given the patient's smoking history and lack of prior chest imaging to establish stability, I do recommend nonurgent chest CT followup to further characterize these opacities and exclude underlying malignancy. 2. Atherosclerosis.   Electronically Signed   By: Van Clines M.D.   On: 07/01/2014 15:01   Mr [name] Wo Contrast  07/01/2014   CLINICAL DATA:  64 year old hypertensive female with history of smoking presenting with left hand numbness and clumsiness. Small acute infarct right precentral cortex noted on recent MR. Subsequent encounter.  EXAM: MRA HEAD WITHOUT CONTRAST  TECHNIQUE: Angiographic images of the Circle of Willis were obtained using MRA technique without intravenous contrast.  COMPARISON:  07/01/2014 brain MR.  FINDINGS: Motion degradation contributes to appearance of branch vessel irregularity.  Mild narrowing right internal carotid artery cavernous/supraclinoid segment junction.  4.4 mm aneurysm suspected arising at the junction of the A1/ A2 segment of the right anterior cerebral artery and directed inferiorly.  No high-grade stenosis of either carotid terminus or M1 segment of the middle cerebral artery.  Middle cerebral artery and A2 segment anterior cerebral artery branch vessel irregularity bilaterally may be related to atherosclerotic disease and/or result of motion artifact.  No significant stenosis of either distal vertebral artery.  Left posterior inferior cerebellar artery and  right anterior inferior cerebellar artery not visualized.  No significant stenosis of the basilar artery.  Mild irregularity of the superior cerebellar artery and distal branches of the posterior cerebral artery bilaterally  IMPRESSION: 4.4 mm aneurysm suspected arising at the junction of the A1/ A2 segment of the right anterior cerebral artery and directed inferiorly.  Mild narrowing right internal carotid artery cavernous/supraclinoid segment junction.  No high-grade stenosis of either carotid terminus or M1 segment of the middle cerebral artery.  Branch vessel irregularity as detailed above.  These results will be called to the ordering clinician or representative by the Radiologist Assistant, and communication documented in the PACS or zVision Dashboard.   Electronically Signed   By: Genia Del M.D.   On: 07/01/2014 16:39   Mr Brain Wo Contrast  07/01/2014   CLINICAL DATA:  Left arm weakness and tingling began 2 a.m. today.  EXAM: MRI HEAD WITHOUT CONTRAST  TECHNIQUE: Multiplanar, multiecho pulse sequences of the brain and surrounding structures were obtained without intravenous contrast.  COMPARISON:  None.  FINDINGS: Small area of acute infarct in the right precentral cortex over the convexity. This appears to be in the precentral cortex with scattered small areas of patchy restricted diffusion. No other acute infarct.  Ventricle size normal.  Cerebral volume normal.  Negative for  intracranial hemorrhage.  Negative for mass or edema.  Paranasal sinuses are clear.  Enlargement of the sella which is filled with CSF. Possible arachnoid cyst versus empty sella. Pituitary normal in size.  IMPRESSION: Small area of acute infarct right precentral cortex over the convexity. Negative for hemorrhage.   Electronically Signed   By: Franchot Gallo M.D.   On: 07/01/2014 10:00    Scheduled Meds: .  stroke: mapping our early stages of recovery book   Does not apply Once  . amLODipine  2.5 mg Oral QHS  . aspirin   300 mg Rectal Daily   Or  . aspirin  325 mg Oral Daily  . atorvastatin  20 mg Oral q1800  . enoxaparin (LOVENOX) injection  40 mg Subcutaneous Q24H  . hydrochlorothiazide  12.5 mg Oral Daily  . lisinopril  10 mg Oral QHS  . loratadine  10 mg Oral Daily  . oxybutynin  10 mg Oral QHS  . senna-docusate  1 tablet Oral QHS   Continuous Infusions:   Principal Problem:   Stroke Active Problems:   Hypertension   Tobacco abuse   Abnormal CXR   Aneurysm of anterior cerebral artery: Right per MRI 07/01/14   Hyperlipidemia    Time spent: 73 minutes    Gustavus Haskin M.D. [facility] Pager [phone]. If 7PM-7AM, please contact night-coverage at [email or web address], password [facility] 07/02/2014, 3:52 PM  LOS: 1 day

## 2014-07-02 NOTE — Progress Notes (Signed)
    [facility] has been requested to perform a transesophageal echocardiogram on Hannah Nielsen for stroke workup.  After careful review of history and examination, the risks and benefits of transesophageal echocardiogram have been explained including risks of esophageal damage, perforation (1:10,000 risk), bleeding, pharyngeal hematoma as well as other potential complications associated with conscious sedation including aspiration, arrhythmia, respiratory failure and death. Alternatives to treatment were discussed, questions were answered. Patient is willing to proceed.   Tarri Fuller, PA-C 07/02/2014 2:52 PM

## 2014-07-02 NOTE — Progress Notes (Signed)
STROKE TEAM PROGRESS NOTE   HISTORY Breckin Zafar is an 64 y.o. female with history of HTN and tobacco abuse. patient went to sleep last night (LKW 06/30/2014, time unknown) and awoke at around 2 AM 07/01/2014. She states she realized her left hand felt numb and clumsy. She went back to sleep and upon waking her left hand continued to have decreased sensation and feel clumsy. She drove herself to ED and MRI was obtained showing a right precentral cortex infarct. Currently she has clumsiness of her left hand and decreased sensation oover the small and ring finger. Patient was not administered TPA secondary to delay in arrival. She was admitted for further evaluation and treatment.   SUBJECTIVE (INTERVAL HISTORY) Her daughter is at the bedside.  Overall she feels her condition is stable. Daughter feels she has OSA after a recent hotel stay with her.    OBJECTIVE Temp:  [97.9 F (36.6 C)-98.2 F (36.8 C)] 97.9 F (36.6 C) (03/23 0934) Pulse Rate:  [70-89] 72 (03/23 0934) Cardiac Rhythm:  [-] Normal sinus rhythm (03/23 0800) Resp:  [14-23] 20 (03/23 0934) BP: (121-158)/(57-94) 127/94 mmHg (03/23 0934) SpO2:  [91 %-98 %] 93 % (03/23 0934)   Recent Labs Lab 07/01/14 2225 07/02/14 0645  GLUCAP 178* 106*    Recent Labs Lab 07/01/14 0810 07/02/14 1136  NA 137 140  K 4.3 4.1  CL 101 107  CO2 27 23  GLUCOSE 99 75  BUN 15 11  CREATININE 0.73 0.58  CALCIUM 9.2 8.9    Recent Labs Lab 07/01/14 0810  AST 25  ALT 19  ALKPHOS 77  BILITOT 0.8  PROT 6.4  ALBUMIN 3.6    Recent Labs Lab 07/01/14 0810 07/02/14 1136  WBC 10.3 9.3  NEUTROABS 7.0  --   HGB 15.1* 14.5  HCT 43.4 43.8  MCV 91.9 93.6  PLT 321 309    Recent Labs Lab 07/01/14 0810  TROPONINI <0.03    Recent Labs  07/01/14 0810  LABPROT 12.8  INR 0.95    Recent Labs  07/01/14 0821  COLORURINE YELLOW  LABSPEC 1.012  PHURINE 6.0  GLUCOSEU NEGATIVE  HGBUR NEGATIVE  BILIRUBINUR NEGATIVE  KETONESUR  NEGATIVE  PROTEINUR NEGATIVE  UROBILINOGEN 0.2  NITRITE NEGATIVE  LEUKOCYTESUR NEGATIVE       Component Value Date/Time   CHOL 197 07/02/2014 0628   TRIG 249* 07/02/2014 0628   HDL 35* 07/02/2014 0628   CHOLHDL 5.6 07/02/2014 0628   VLDL 50* 07/02/2014 0628   LDLCALC 112* 07/02/2014 0628   No results found for: HGBA1C    Component Value Date/Time   LABOPIA NONE DETECTED 07/01/2014 0821   COCAINSCRNUR NONE DETECTED 07/01/2014 0821   LABBENZ NONE DETECTED 07/01/2014 0821   AMPHETMU NONE DETECTED 07/01/2014 0821   THCU NONE DETECTED 07/01/2014 0821   LABBARB NONE DETECTED 07/01/2014 0821     Recent Labs Lab 07/01/14 0810  ETH <5    Dg Chest 2 View  07/01/2014   CLINICAL DATA:  Stroke. Left hand numbness. cough and congestion. Hypertension.  EXAM: CHEST  2 VIEW  COMPARISON:  None.  FINDINGS: Bandlike density in the left lower lobe and right middle lobe. Vague nodularity along the left basilar opacity. Atherosclerotic calcification of the aortic arch. Calcifications along the left hilum likely reflecting old granulomatous disease. Thoracic spondylosis is present.  IMPRESSION: 1. Bandlike opacities in the right middle lobe and left lower lobe, with some nodularity along the left lower lobe opacity. Given the patient's smoking  history and lack of prior chest imaging to establish stability, I do recommend nonurgent chest CT followup to further characterize these opacities and exclude underlying malignancy. 2. Atherosclerosis.   Electronically Signed   By: Van Clines M.D.   On: 07/01/2014 15:01   Mr [name] Wo Contrast  07/01/2014   CLINICAL DATA:  64 year old hypertensive female with history of smoking presenting with left hand numbness and clumsiness. Small acute infarct right precentral cortex noted on recent MR. Subsequent encounter.  EXAM: MRA HEAD WITHOUT CONTRAST  TECHNIQUE: Angiographic images of the Circle of Willis were obtained using MRA technique without intravenous  contrast.  COMPARISON:  07/01/2014 brain MR.  FINDINGS: Motion degradation contributes to appearance of branch vessel irregularity.  Mild narrowing right internal carotid artery cavernous/supraclinoid segment junction.  4.4 mm aneurysm suspected arising at the junction of the A1/ A2 segment of the right anterior cerebral artery and directed inferiorly.  No high-grade stenosis of either carotid terminus or M1 segment of the middle cerebral artery.  Middle cerebral artery and A2 segment anterior cerebral artery branch vessel irregularity bilaterally may be related to atherosclerotic disease and/or result of motion artifact.  No significant stenosis of either distal vertebral artery.  Left posterior inferior cerebellar artery and right anterior inferior cerebellar artery not visualized.  No significant stenosis of the basilar artery.  Mild irregularity of the superior cerebellar artery and distal branches of the posterior cerebral artery bilaterally  IMPRESSION: 4.4 mm aneurysm suspected arising at the junction of the A1/ A2 segment of the right anterior cerebral artery and directed inferiorly.  Mild narrowing right internal carotid artery cavernous/supraclinoid segment junction.  No high-grade stenosis of either carotid terminus or M1 segment of the middle cerebral artery.  Branch vessel irregularity as detailed above.  These results will be called to the ordering clinician or representative by the Radiologist Assistant, and communication documented in the PACS or zVision Dashboard.   Electronically Signed   By: Genia Del M.D.   On: 07/01/2014 16:39   Mr Brain Wo Contrast  07/01/2014   CLINICAL DATA:  Left arm weakness and tingling began 2 a.m. today.  EXAM: MRI HEAD WITHOUT CONTRAST  TECHNIQUE: Multiplanar, multiecho pulse sequences of the brain and surrounding structures were obtained without intravenous contrast.  COMPARISON:  None.  FINDINGS: Small area of acute infarct in the right precentral cortex over  the convexity. This appears to be in the precentral cortex with scattered small areas of patchy restricted diffusion. No other acute infarct.  Ventricle size normal.  Cerebral volume normal.  Negative for intracranial hemorrhage.  Negative for mass or edema.  Paranasal sinuses are clear.  Enlargement of the sella which is filled with CSF. Possible arachnoid cyst versus empty sella. Pituitary normal in size.  IMPRESSION: Small area of acute infarct right precentral cortex over the convexity. Negative for hemorrhage.   Electronically Signed   By: Franchot Gallo M.D.   On: 07/01/2014 10:00     PHYSICAL EXAM Pleasant middle aged Caucasian lady not in distress. . Afebrile. Head is nontraumatic. Neck is supple without bruit.    Cardiac exam no murmur or gallop. Lungs are clear to auscultation. Distal pulses are well felt. Neurological Exam ;  Awake  Alert oriented x 3. Normal speech and language.eye movements full without nystagmus.fundi were not visualized. Vision acuity and fields appear normal. Hearing is normal. Palatal movements are normal. Face symmetric. Tongue midline. Normal strength, tone, reflexes and coordination. Normal sensation. Gait deferred. ASSESSMENT/PLAN Ms. Chlora  Kreischer is a 65 y.o. female with history of HTN and tobacco abuse presenting with left hand felt numbness and clumsiness. She did not receive IV t-PA due to delay in arrival.   Stroke:  Non-dominant right high parietal cortical infarct embolic secondary to unknown source  Resultant  Mild L hand numbness  MRI  Right high parietal cortical infarct  MRA  No large vessel stenosis,  4.4 mm R A1/ A2 aneurysm    Carotid Doppler  pending   2D Echo  pending   TEE to look for embolic source. Arranged with [place] for tomorrow.  If positive for PFO (patent foramen ovale), check bilateral lower extremity venous dopplers to rule out DVT as possible source of stroke. (I have made patient NPO after midnight  tonight).   If TEE negative, a [place] electrophysiologist will consult and consider placement of an implantable loop recorder to evaluate for atrial fibrillation as etiology of stroke. This has been explained to patient/family by Dr. Leonie Man and they are agreeable.   HgbA1c pending  Lovenox 40 mg sq daily for VTE prophylaxis  Diet regular thin liquids  aspirin 81 mg orally every day prior to admission, now on aspirin 325 mg orally every day  Patient counseled to be compliant with her antithrombotic medications  Ongoing aggressive stroke risk factor management  Therapy recommendations:  OP OT  Disposition:  Anticipate return home with OP therapies  R ACA aneurysm  4.4 mm (failrly small)  Incidental finding, asymptomatic  Risk of rupture < 1% per year  Family hx ruptured aneurysms - pts father and her father's mother  Consider treatment options as an OP. Not a candidate for treatment at this time given current stroke. Will complete stroke workup first. Patient given options of radiology or neurosurgery consult - family desires OP neurosurgery consult.  Hypertension  Home meds:   Noravx, HCTZ, lisinopril  Stable  Keep BP <130/90  Hyperlipidemia  Home meds:  No statin  LDL 112, goal < 70  Added statin, lipitor 20  Continue statin at discharge  Other Stroke Risk Factors  Cigarette smoker, advised to stop smoking  ETOH use  ? Obesity, There is no height on file to calculate BMI.  Patient advised on a healthy diet by Dr. Leonie Man  Possible obstructive sleep apnea. Needs OP eval. Dr. Leonie Man will arrange as an OP.  Hospital day # [place] for Pager information 07/02/2014 12:07 PM  I have personally examined this patient, reviewed notes, independently viewed imaging studies, participated in medical decision making and plan of care. I have made any additions or clarifications directly to the above  note. Agree with note above. She presented with transient left hand weakness and paresthesias from a small right MCA branch infarct likely of embolic etiology source to be determined. Recommend she remains at significant risk for recurrent strokes, TIAs, neurological worsening and needs ongoing stroke evaluation and aggressive risk factor modification. Check TEE and loop recorder. She also has incidental asymptomatic anterior communicating artery 4 mm aneurysm but given family history of 2 members dying of ruptured aneurysm she may want to consider treatment for this. We will refer him to Dr. [name] from neurosurgery as an outpatient .Outpatient sleep study for sleep apnoea. D/W patient and daughter and answered questions Antony Contras, MD Medical Director [facility] Pager: [phone] 07/02/2014 4:05 PM     To contact Stroke Continuity provider,  please refer to [email or web address] After hours, contact General Neurology

## 2014-07-02 NOTE — Progress Notes (Signed)
PT Cancellation Note  Patient Details Name: Hannah Nielsen MRN: [id number] DOB: [birth year]   Cancelled Treatment:    Reason Eval/Treat Not Completed: Patient at procedure or test/unavailable Pt off floor this PM. Will follow up next available time to perform PT evaluation.  Candy Sledge A 07/02/2014, 1:42 PM Candy Sledge, [place], DPT [phone]

## 2014-07-02 NOTE — Progress Notes (Signed)
  Echocardiogram 2D Echocardiogram has been performed.  Diamond Nickel 07/02/2014, 2:08 PM

## 2014-07-02 NOTE — Care Management Note (Unsigned)
    Page 1 of 1   07/04/2014     2:31:40 PM CARE MANAGEMENT NOTE 07/04/2014  Patient:  Hannah Nielsen,Hannah Nielsen   Account Number:  [id number]  Date Initiated:  07/02/2014  Documentation initiated by:  Lorne Skeens  Subjective/Objective Assessment:   Patient was admitted with CVA.     Action/Plan:   Will follow for discharge needs.   Anticipated DC Date:     Anticipated DC Plan:  HOME/SELF CARE         Choice offered to / List presented to:             Status of service:  In process, will continue to follow Medicare Important Message given?  YES (If response is "NO", the following Medicare IM given date fields will be blank) Date Medicare IM given:  07/04/2014 Medicare IM given by:  Carles Collet Date Additional Medicare IM given:   Additional Medicare IM given by:    Discharge Disposition:  HOME/SELF CARE  Per UR Regulation:    If discussed at Long Length of Stay Meetings, dates discussed:    Comments:  07/04/14 12:00 provided pt with copy of IM Carles Collet RN3 BSN CM 07/02/14 [place] RN, MSN, CM- Met with patient to verify that she has a PCP. Patient's PCP is Dr Felipa Eth with [facility].

## 2014-07-02 NOTE — Progress Notes (Signed)
   07/02/14 1600  Clinical Encounter Type  Visited With Patient and family together  Visit Type Initial   Chaplain followed up with patient and facilitated the completion of the patient's advanced directive. Patient's advanced directive is now on file in patient's chart and patient has a few copies of her own.  Kory Rains, Claudius Sis, Chaplain  4:53 PM

## 2014-07-02 NOTE — Evaluation (Signed)
SLP screened Note  Patient Details Name: Hannah Nielsen MRN: [id number] DOB: 05-10-1950   Cancelled treatment:       Reason Eval/Treat Not Completed: SLP screened, no needs identified, will sign off   Luanna Salk, [facility] SLP [phone]

## 2014-07-03 ENCOUNTER — Encounter (HOSPITAL_COMMUNITY)
Admission: EM | Disposition: A | Discharge status: Home / Self Care | Payer: Self-pay | Source: Home / Self Care | Attending: Internal Medicine

## 2014-07-03 DIAGNOSIS — R7303 Prediabetes: Secondary | ICD-10-CM | POA: Diagnosis present

## 2014-07-03 DIAGNOSIS — D151 Benign neoplasm of heart: Secondary | ICD-10-CM

## 2014-07-03 DIAGNOSIS — R7309 Other abnormal glucose: Secondary | ICD-10-CM

## 2014-07-03 DIAGNOSIS — I5189 Other ill-defined heart diseases: Secondary | ICD-10-CM

## 2014-07-03 HISTORY — PX: TEE WITHOUT CARDIOVERSION: SHX5443

## 2014-07-03 HISTORY — DX: Other ill-defined heart diseases: I51.89

## 2014-07-03 LAB — CBC
HEMATOCRIT: 47 % — AB (ref 36.0–46.0)
Hemoglobin: 15.8 g/dL — ABNORMAL HIGH (ref 12.0–15.0)
MCH: 31.2 pg (ref 26.0–34.0)
MCHC: 33.6 g/dL (ref 30.0–36.0)
MCV: 92.9 fL (ref 78.0–100.0)
Platelets: 306 10*3/uL (ref 150–400)
RBC: 5.06 MIL/uL (ref 3.87–5.11)
RDW: 13.4 % (ref 11.5–15.5)
WBC: 10 10*3/uL (ref 4.0–10.5)

## 2014-07-03 LAB — BASIC METABOLIC PANEL
ANION GAP: 8 (ref 5–15)
BUN: 12 mg/dL (ref 6–23)
CALCIUM: 9.1 mg/dL (ref 8.4–10.5)
CHLORIDE: 107 mmol/L (ref 96–112)
CO2: 25 mmol/L (ref 19–32)
Creatinine, Ser: 0.56 mg/dL (ref 0.50–1.10)
GFR calc Af Amer: >90 mL/min (ref >90)
GFR calc non Af Amer: >90 mL/min (ref >90)
Glucose, Bld: 104 mg/dL — ABNORMAL HIGH (ref 70–99)
Potassium: 3.9 mmol/L (ref 3.5–5.1)
SODIUM: 140 mmol/L (ref 135–145)

## 2014-07-03 LAB — HEMOGLOBIN A1C
Hgb A1c MFr Bld: 6.3 % — ABNORMAL HIGH (ref 4.8–5.6)
Mean Plasma Glucose: 134 mg/dL

## 2014-07-03 SURGERY — ECHOCARDIOGRAM, TRANSESOPHAGEAL
Anesthesia: Moderate Sedation

## 2014-07-03 SURGERY — LOOP RECORDER IMPLANT
Anesthesia: LOCAL

## 2014-07-03 MED ORDER — SODIUM CHLORIDE 0.9 % IV SOLN
INTRAVENOUS | Status: DC
  Administered 2014-07-03: 09:00:00 via INTRAVENOUS

## 2014-07-03 MED ORDER — NICOTINE POLACRILEX 2 MG MT GUM: 2.0000 mg | CHEWING_GUM | OROMUCOSAL | Status: DC | PRN

## 2014-07-03 MED ORDER — FENTANYL CITRATE 0.05 MG/ML IJ SOLN
INTRAMUSCULAR | Status: DC | PRN
  Administered 2014-07-03 (×2): 25 ug via INTRAVENOUS

## 2014-07-03 MED ORDER — LIDOCAINE-EPINEPHRINE 1 %-1:100000 IJ SOLN
INTRAMUSCULAR | Status: AC
  Filled 2014-07-03: qty 1

## 2014-07-03 MED ORDER — MIDAZOLAM HCL 10 MG/2ML IJ SOLN
INTRAMUSCULAR | Status: DC | PRN
  Administered 2014-07-03: 2 mg via INTRAVENOUS
  Administered 2014-07-03: 1 mg via INTRAVENOUS

## 2014-07-03 MED ORDER — NICOTINE POLACRILEX 2 MG MT GUM
4.0000 mg | CHEWING_GUM | OROMUCOSAL | Status: DC | PRN
  Administered 2014-07-03 – 2014-07-09 (×8): 4 mg via ORAL
  Filled 2014-07-03 (×16): qty 2

## 2014-07-03 MED ORDER — MIDAZOLAM HCL 5 MG/ML IJ SOLN
INTRAMUSCULAR | Status: AC
  Filled 2014-07-03: qty 2

## 2014-07-03 MED ORDER — BUTAMBEN-TETRACAINE-BENZOCAINE 2-2-14 % EX AERO
INHALATION_SPRAY | CUTANEOUS | Status: DC | PRN
  Administered 2014-07-03: 2 via TOPICAL

## 2014-07-03 MED ORDER — FENTANYL CITRATE 0.05 MG/ML IJ SOLN
INTRAMUSCULAR | Status: AC
  Filled 2014-07-03: qty 2

## 2014-07-03 NOTE — Progress Notes (Signed)
STROKE TEAM PROGRESS NOTE   HISTORY Hannah Nielsen is an 64 y.o. female with history of HTN and tobacco abuse. patient went to sleep last night (LKW 06/30/2014, time unknown) and awoke at around 2 AM 07/01/2014. She states she realized her left hand felt numb and clumsy. She went back to sleep and upon waking her left hand continued to have decreased sensation and feel clumsy. She drove herself to ED and MRI was obtained showing a right precentral cortex infarct. Currently she has clumsiness of her left hand and decreased sensation oover the small and ring finger. Patient was not administered TPA secondary to delay in arrival. She was admitted for further evaluation and treatment.   SUBJECTIVE (INTERVAL HISTORY) Daughter at the bedside. 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   OBJECTIVE Temp:  [97.8 F (36.6 C)-98.6 F (37 C)] 98.5 F (36.9 C) (03/24 0954) Pulse Rate:  [68-88] 74 (03/24 1010) Cardiac Rhythm:  [-] Normal sinus rhythm (03/24 1200) Resp:  [13-26] 18 (03/24 1010) BP: (103-150)/(61-91) 103/80 mmHg (03/24 1010) SpO2:  [92 %-98 %] 95 % (03/24 1010) FiO2 (%):  [95 %] 95 % (03/24 0954)   Recent Labs Lab 07/01/14 2225 07/02/14 0645  GLUCAP 178* 106*    Recent Labs Lab 07/01/14 0810 07/02/14 1136 07/03/14 0720  NA 137 140 140  K 4.3 4.1 3.9  CL 101 107 107  CO2 27 23 25   GLUCOSE 99 75 104*  BUN 15 11 12   CREATININE 0.73 0.58 0.56  CALCIUM 9.2 8.9 9.1    Recent Labs Lab 07/01/14 0810  AST 25  ALT 19  ALKPHOS 77  BILITOT 0.8  PROT 6.4  ALBUMIN 3.6    Recent Labs Lab 07/01/14 0810 07/02/14 1136 07/03/14 0720  WBC 10.3 9.3 10.0  NEUTROABS 7.0  --   --   HGB 15.1* 14.5 15.8*  HCT 43.4 43.8 47.0*  MCV 91.9 93.6 92.9  PLT 321 309 306    Recent Labs Lab 07/01/14 0810  TROPONINI <0.03    Recent Labs  07/01/14 0810  LABPROT 12.8  INR 0.95    Recent Labs  07/01/14 0821  COLORURINE YELLOW  LABSPEC 1.012  PHURINE 6.0  GLUCOSEU  NEGATIVE  HGBUR NEGATIVE  BILIRUBINUR NEGATIVE  KETONESUR NEGATIVE  PROTEINUR NEGATIVE  UROBILINOGEN 0.2  NITRITE NEGATIVE  LEUKOCYTESUR NEGATIVE       Component Value Date/Time   CHOL 197 07/02/2014 0628   TRIG 249* 07/02/2014 0628   HDL 35* 07/02/2014 0628   CHOLHDL 5.6 07/02/2014 0628   VLDL 50* 07/02/2014 0628   LDLCALC 112* 07/02/2014 0628   Lab Results  Component Value Date   HGBA1C 6.3* 07/02/2014      Component Value Date/Time   LABOPIA NONE DETECTED 07/01/2014 0821   COCAINSCRNUR NONE DETECTED 07/01/2014 0821   LABBENZ NONE DETECTED 07/01/2014 0821   AMPHETMU NONE DETECTED 07/01/2014 0821   THCU NONE DETECTED 07/01/2014 0821   LABBARB NONE DETECTED 07/01/2014 0821     Recent Labs Lab 07/01/14 0810  ETH <5    Dg Chest 2 View  07/01/2014   CLINICAL DATA:  Stroke. Left hand numbness. cough and congestion. Hypertension.  EXAM: CHEST  2 VIEW  COMPARISON:  None.  FINDINGS: Bandlike density in the left lower lobe and right middle lobe. Vague nodularity along the left basilar opacity. Atherosclerotic calcification of the aortic arch. Calcifications along the left hilum likely reflecting old granulomatous disease. Thoracic spondylosis is present.  IMPRESSION: 1. Bandlike opacities in  the right middle lobe and left lower lobe, with some nodularity along the left lower lobe opacity. Given the patient's smoking history and lack of prior chest imaging to establish stability, I do recommend nonurgent chest CT followup to further characterize these opacities and exclude underlying malignancy. 2. Atherosclerosis.   Electronically Signed   By: Van Clines M.D.   On: 07/01/2014 15:01   Mr Hannah Nielsen Wo Contrast  07/01/2014   CLINICAL DATA:  64 year old hypertensive female with history of smoking presenting with left hand numbness and clumsiness. Small acute infarct right precentral cortex noted on recent MR. Subsequent encounter.  EXAM: MRA HEAD WITHOUT CONTRAST  TECHNIQUE:  Angiographic images of the Circle of Willis were obtained using MRA technique without intravenous contrast.  COMPARISON:  07/01/2014 brain MR.  FINDINGS: Motion degradation contributes to appearance of branch vessel irregularity.  Mild narrowing right internal carotid artery cavernous/supraclinoid segment junction.  4.4 mm aneurysm suspected arising at the junction of the A1/ A2 segment of the right anterior cerebral artery and directed inferiorly.  No high-grade stenosis of either carotid terminus or M1 segment of the middle cerebral artery.  Middle cerebral artery and A2 segment anterior cerebral artery branch vessel irregularity bilaterally may be related to atherosclerotic disease and/or result of motion artifact.  No significant stenosis of either distal vertebral artery.  Left posterior inferior cerebellar artery and right anterior inferior cerebellar artery not visualized.  No significant stenosis of the basilar artery.  Mild irregularity of the superior cerebellar artery and distal branches of the posterior cerebral artery bilaterally  IMPRESSION: 4.4 mm aneurysm suspected arising at the junction of the A1/ A2 segment of the right anterior cerebral artery and directed inferiorly.  Mild narrowing right internal carotid artery cavernous/supraclinoid segment junction.  No high-grade stenosis of either carotid terminus or M1 segment of the middle cerebral artery.  Branch vessel irregularity as detailed above.  These results will be called to the ordering clinician or representative by the Radiologist Assistant, and communication documented in the PACS or zVision Dashboard.   Electronically Signed   By: Genia Del M.D.   On: 07/01/2014 16:39   2D Echocardiogram  - Left ventricle: The cavity size was normal. There was mild concentric hypertrophy. Systolic function was vigorous. The estimated ejection fraction was in the range of 65% to 70%. Wall motion was normal; there were no regional wall motion  abnormalities. Doppler parameters are consistent with abnormal left ventricular relaxation. No cardiac source of emboli was indentified.  TEE  Intracardiac mass (1 x 1.3cm) in lateral left atrium. Could represent myxoma with fingerlike thrombogenic material.  Normal EF.  Trace MR.  Aortic atherosclerosis (moderate).  Mildly positive bubble contrast study with very few bubble cross over from right to left atrium during Valsalva maenuver.    PHYSICAL EXAM Pleasant middle aged 36 lady not in distress. . Afebrile. Head is nontraumatic. Neck is supple without bruit.    Cardiac exam no murmur or gallop. Lungs are clear to auscultation. Distal pulses are well felt. Neurological Exam ;  Awake  Alert oriented x 3. Normal speech and language.eye movements full without nystagmus.fundi were not visualized. Vision acuity and fields appear normal. Hearing is normal. Palatal movements are normal. Face symmetric. Tongue midline. Normal strength, tone, reflexes and coordination. Normal sensation. Gait deferred.   ASSESSMENT/PLAN Ms. Denell Cothern is a 64 y.o. female with history of HTN and tobacco abuse presenting with left hand felt numbness and clumsiness. She did not receive IV t-PA due  to delay in arrival.   Stroke:  Non-dominant right high parietal cortical infarct, R MCA branch, embolic found to be secondary to probably LA cardiac mass  Resultant  Mild L hand numbness - nearly completely resolved  MRI  Right high parietal cortical infarct  MRA  No large vessel stenosis,  4.4 mm R A1/ A2 aneurysm    Carotid Doppler  pending   2D Echo  No source of embolus  TEE w/ intracardiac mass, probable PFO. Cardiac surgeon has been consulted  Will cancel implantable loop recorder   HgbA1c 6.3, at goal  Lovenox 40 mg sq daily for VTE prophylaxis  Diet regular Room service appropriate?: Yes; Fluid consistency:: Thin   aspirin 81 mg orally every day prior to admission, now on aspirin 325 mg orally  every day  Patient counseled to be compliant with her antithrombotic medications  Ongoing aggressive stroke risk factor management  Therapy recommendations:  OP OT, no PT  Disposition:  Anticipate return home with OP therapies  From Stroke standpoint, symptoms have almost totally resolved. Stroke overall very small. [place] for surgery in 1-2 weeks if needed.  R ACA aneurysm  4.4 mm (failrly small)  Incidental finding, asymptomatic  Risk of rupture < 1% per year  Family hx ruptured aneurysms - pts father and her father's mother  Consider treatment options as an OP. Not a candidate for treatment at this time given current stroke. Will complete stroke workup first. Patient given options of radiology or neurosurgery consult - family desires OP neurosurgery consult. Recommend referral to Dr. [name] from neurosurgery as an outpatient.  Hypertension  Home meds:   Noravx, HCTZ, lisinopril  Stable  Keep BP <130/90  Hyperlipidemia  Home meds:  No statin  LDL 112, goal < 70  Added statin, lipitor 20  Continue statin at discharge  Other Stroke Risk Factors  Cigarette smoker, advised to stop smoking  ETOH use  ? Obesity, There is no height on file to calculate BMI.  Patient advised on a healthy diet by Dr. Leonie Man  Possible obstructive sleep apnea. Needs OP eval. Dr. Leonie Man will arrange as an OP.  Abnormal CXR  Bandlike opacities in the right middle lobe and left lower lobe, with some nodularity along the left lower lobe opacity  Hx cigarette smoking  Plan OP CT  Hospital day # [place] for Pager information 07/03/2014 1:29 PM  I have personally examined this patient, reviewed notes, independently viewed imaging studies, participated in medical decision making and plan of care. I have made any additions or clarifications directly to the above note. Patient has a left atrial mass on TEE probably a myxoma. Needs cardiothoracic  evaluation for that for possible surgery. Neurologically she'll be stable for surgery in 1-2 weeks. Cancel loop recorder. Continue aspirin 81 mg. Discussed with patient and daughter at the bedside and answered questions   Antony Contras, MD Medical Director [place] Pager: [phone] 07/03/2014 3:21 PM      To contact Stroke Continuity provider, please refer to [email or web address] After hours, contact General Neurology

## 2014-07-03 NOTE — CV Procedure (Addendum)
    TEE  Indications: Stroke  Findings:  Intracardiac mass (1 x 1.3cm) in lateral left atrium.  Could represent myxoma with fingerlike thrombogenic material.   Normal EF  Trace MR  Aortic atherosclerosis (moderate)  Mildly positive bubble contrast study with very few bubble cross over from right to left atrium during Valsalva maenuver.   Discussed with primary team, Dr. Grandville Silos. Discussed with Dr. Caryl Comes. Placing call to neurology, Dr. Nicole Kindred.  I have placed a call to TCTS (cardiothoracic surgery) to [facility] to consult and review images.    Candee Furbish, MD

## 2014-07-03 NOTE — Consult Note (Signed)
Primary Physician: Primary Cardiologist:  New     HPI: Asked to see by CT surgery regarding L atrial mass  [name] is a 64 yo who was admitted on 3/22 with L hand numbness, weakness.  W/U showed R parietal cortical infarct.  TEE done today by Derl Barrow  Have reviewed with Remus Loffler. (surgery) There is a mass along the lateral wall of the L atrium, above the L atrial proominence  It has fronds, a central core that appears less dense.  ? Myxoma. LVEF normal  NO other signif anbnormalities found    Patient denies CP  Breathing good .  Appetite good  No Fevers No other neurologic spells prior to this admit         Past Medical History  Diagnosis Date  . Hypertension   . Aneurysm of anterior cerebral artery: Right per MRI 07/01/14 07/02/2014    Medications Prior to Admission  Medication Sig Dispense Refill  . acetaminophen (TYLENOL) 500 MG tablet Take 500-1,000 mg by mouth every 6 (six) hours as needed for mild pain or moderate pain.    Marland Kitchen amLODipine (NORVASC) 2.5 MG tablet Take 2.5 mg by mouth at bedtime.     Marland Kitchen aspirin 81 MG tablet Take 81 mg by mouth daily.    Marland Kitchen CALCIUM PO Take 1 tablet by mouth daily.    . cetirizine (ZYRTEC) 10 MG tablet Take 10 mg by mouth at bedtime.    . hydrochlorothiazide (MICROZIDE) 12.5 MG capsule Take 12.5 mg by mouth daily.    Marland Kitchen KRILL OIL PO Take 1 tablet by mouth daily.    Marland Kitchen lisinopril (PRINIVIL,ZESTRIL) 10 MG tablet Take 10 mg by mouth at bedtime.     . LUTEIN PO Take 1 tablet by mouth daily.    . Multiple Vitamins-Minerals (MULTIVITAL) tablet Take 1 tablet by mouth daily.    Marland Kitchen oxybutynin (DITROPAN-XL) 10 MG 24 hr tablet Take 10 mg by mouth at bedtime.       .  stroke: mapping our early stages of recovery book   Does not apply Once  . amLODipine  2.5 mg Oral QHS  . aspirin  300 mg Rectal Daily   Or  . aspirin  325 mg Oral Daily  . atorvastatin  20 mg Oral q1800  . enoxaparin (LOVENOX) injection  40 mg Subcutaneous Q24H  . hydrochlorothiazide   12.5 mg Oral Daily  . lisinopril  10 mg Oral QHS  . loratadine  10 mg Oral Daily  . oxybutynin  10 mg Oral QHS  . senna-docusate  1 tablet Oral QHS    Infusions: . sodium chloride    . sodium chloride      Allergies  Allergen Reactions  . Sulfa Antibiotics     History   Social History  . Marital Status: Divorced    Spouse Name: N/A  . Number of Children: N/A  . Years of Education: N/A   Occupational History  . Not on file.   Social History Main Topics  . Smoking status: Current Every Day Smoker  . Smokeless tobacco: Not on file  . Alcohol Use: Yes  . Drug Use: No  . Sexual Activity: Not on file   Other Topics Concern  . Not on file   Social History Narrative    Family History  Problem Relation Age of Onset  . Hypertension Mother   . Heart failure Mother     Deceased  . Hypertension Sister   . Hypertension Brother   .  Cerebral aneurysm Father 52    Deceased    REVIEW OF SYSTEMS:  All systems reviewed  Negative to the above problem except as noted above.    PHYSICAL EXAM: Filed Vitals:   07/03/14 1415  BP: 134/65  Pulse: 76  Temp: 98.4 F (36.9 C)  Resp: 18     Intake/Output Summary (Last 24 hours) at 07/03/14 1637 Last data filed at 07/03/14 1011  Gross per 24 hour  Intake    400 ml  Output      0 ml  Net    400 ml    General:  Well appearing. No respiratory difficulty HEENT: normal Neck: supple. no JVD. Carotids 2+ bilat; no bruits. No lymphadenopathy or thryomegaly appreciated. Cor: PMI nondisplaced. Regular rate & rhythm. No rubs, gallops or murmurs. Lungs: clear Abdomen: soft, nontender, nondistended. No hepatosplenomegaly. No bruits or masses. Good bowel sounds. Extremities: no cyanosis, clubbing, rash, edema Neuro: alert & oriented x 3, cranial nerves grossly intact. moves all 4 extremities w/o difficulty. Affect pleasant.  ECG:  SR 77 bpm  rSR' V2  Nonspecific ST [place] changes    Results for orders placed or performed during  the hospital encounter of 07/01/14 (from the past 24 hour(s))  Basic metabolic panel     Status: Abnormal   Collection Time: 07/03/14  7:20 AM  Result Value Ref Range   Sodium 140 135 - 145 mmol/L   Potassium 3.9 3.5 - 5.1 mmol/L   Chloride 107 96 - 112 mmol/L   CO2 25 19 - 32 mmol/L   Glucose, Bld 104 (H) 70 - 99 mg/dL   BUN 12 6 - 23 mg/dL   Creatinine, Ser 0.56 0.50 - 1.10 mg/dL   Calcium 9.1 8.4 - 10.5 mg/dL   GFR calc non Af Amer >90 >90 mL/min   GFR calc Af Amer >90 >90 mL/min   Anion gap 8 5 - 15  CBC     Status: Abnormal   Collection Time: 07/03/14  7:20 AM  Result Value Ref Range   WBC 10.0 4.0 - 10.5 K/uL   RBC 5.06 3.87 - 5.11 MIL/uL   Hemoglobin 15.8 (H) 12.0 - 15.0 g/dL   HCT 47.0 (H) 36.0 - 46.0 %   MCV 92.9 78.0 - 100.0 fL   MCH 31.2 26.0 - 34.0 pg   MCHC 33.6 30.0 - 36.0 g/dL   RDW 13.4 11.5 - 15.5 %   Platelets 306 150 - 400 K/uL   No results found.   ASSESSMENT: Patient is a 64 yo admitted with CVA  TEE today shows L atrial mass Not convinced it is clot  Have reviewed with CT surgery  Would recomm cardiac MRI to define further.  Will continue to follow    2  HTN  Continue meds  3.  HL  ON statin

## 2014-07-03 NOTE — Progress Notes (Signed)
[facility] PROGRESS NOTE  Hannah Nielsen BSW:[id number] DOB: July 16, 1950 DOA: 07/01/2014 PCP: Hannah Argyle, MD  Assessment/Plan: #1 acute CVA: Nondominant right high parietal cortical infarct embolic secondary to unknown source. Patient states left hand numbness clinically improving and close to baseline. MRI MRA of the head with right high parietal cortical infarct. 4.4 millimeter right A1/A2 aneurysm. Carotid Dopplers were done with no significant ICA stenosis. 2-D echo done with EF of 65-70%, no wall motion abnormalities, no cardiac source of emboli. Hemoglobin A1c is pending. Patient has been seen in consultation by neurology recommending a TEE to rule out embolic source. 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 Cardiology to consult with cardiothoracic surgery for further evaluation and management. Per cardiology no need for loop recorder at this time. Continue full dose aspirin for secondary stroke prevention. Risk factor modification. PT/OT.  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 Could be likely etiology of patient's stroke. Per cardiology cardiac cardiothoracic surgery to be consulted for further evaluation and management.?? Anticoagulation however will defer to cardiology and neurology.   #3 right ACA aneurysm Noted on MRA of the head which was 4.4 mm. Patient currently asymptomatic. Risk of rupture less than 1%.. Patient with family history of ruptured aneurysms in the past. Risk factor modification. Blood pressure management. Outpatient follow-up with neurosurgery for further evaluation and recommendations.  #4 hypertension Stable. Continue home regimen of Norvasc. Resumed home regimen of lisinopril and HCTZ. Goal blood pressure systolic < 466.  #5 hyperlipidemia LDL of 112. Goal LDL less than 70. Patient has been started on low-dose statin. Outpatient follow-up.  #6 tobacco abuse Tobacco  cessation.  #7 abnormal chest x-ray Outpatient CT chest for further evaluation. Patient currently asymptomatic.  #8 Pre diabetes Diet and lifestyle modification  #9 prophylaxis Lovenox for DVT prophylaxis.    Code Status: Full Family Communication: Updated patient and daughter at bedside. Disposition Plan: Home when medically stable hopefully 1-2 days.   Consultants:  Neurology: Dr. Nicole Nielsen 07/01/2014  Cardiology: Dr  Hannah Nielsen 07/02/14  Procedures:  2-D echo 07/02/2014  Carotid Dopplers 07/02/2014  Chest x-ray 07/01/2014  MRI/MRA head 07/01/2014     Antibiotics:  None  HPI/Subjective: Patient states numbness and tingling has improved significantly and close to her baseline. No complaints.  Objective: Filed Vitals:   07/03/14 1010  BP: 103/80  Pulse: 74  Temp:   Resp: 18    Intake/Output Summary (Last 24 hours) at 07/03/14 1421 Last data filed at 07/03/14 1011  Gross per 24 hour  Intake    400 ml  Output      0 ml  Net    400 ml   Filed Weights   07/01/14 0733  Weight: 83.915 kg (185 lb)    Exam:   General:  NAD  Cardiovascular: RRR  Respiratory: CTAB  Abdomen: Soft/NT/ND/+BS  Musculoskeletal: No c/c/e  Data Reviewed: Basic Metabolic Panel:  Recent Labs Lab 07/01/14 0810 07/02/14 1136 07/03/14 0720  NA 137 140 140  K 4.3 4.1 3.9  CL 101 107 107  CO2 27 23 25   GLUCOSE 99 75 104*  BUN 15 11 12   CREATININE 0.73 0.58 0.56  CALCIUM 9.2 8.9 9.1   Liver Function Tests:  Recent Labs Lab 07/01/14 0810  AST 25  ALT 19  ALKPHOS 77  BILITOT 0.8  PROT 6.4  ALBUMIN 3.6   No results for input(s): LIPASE, AMYLASE in the last 168 hours. No results for  input(s): AMMONIA in the last 168 hours. CBC:  Recent Labs Lab 07/01/14 0810 07/02/14 1136 07/03/14 0720  WBC 10.3 9.3 10.0  NEUTROABS 7.0  --   --   HGB 15.1* 14.5 15.8*  HCT 43.4 43.8 47.0*  MCV 91.9 93.6 92.9  PLT 321 309 306   Cardiac Enzymes:  Recent Labs Lab  07/01/14 0810  TROPONINI <0.03   BNP (last 3 results) No results for input(s): BNP in the last 8760 hours.  ProBNP (last 3 results) No results for input(s): PROBNP in the last 8760 hours.  CBG:  Recent Labs Lab 07/01/14 2225 07/02/14 0645  GLUCAP 178* 106*    No results found for this or any previous visit (from the past 240 hour(s)).   Studies: Dg Chest 2 View  07/01/2014   CLINICAL DATA:  Stroke. Left hand numbness. cough and congestion. Hypertension.  EXAM: CHEST  2 VIEW  COMPARISON:  None.  FINDINGS: Bandlike density in the left lower lobe and right middle lobe. Vague nodularity along the left basilar opacity. Atherosclerotic calcification of the aortic arch. Calcifications along the left hilum likely reflecting old granulomatous disease. Thoracic spondylosis is present.  IMPRESSION: 1. Bandlike opacities in the right middle lobe and left lower lobe, with some nodularity along the left lower lobe opacity. Given the patient's smoking history and lack of prior chest imaging to establish stability, I do recommend nonurgent chest CT followup to further characterize these opacities and exclude underlying malignancy. 2. Atherosclerosis.   Electronically Signed   By: Hannah Nielsen M.D.   On: 07/01/2014 15:01   Mr Hannah Nielsen Wo Contrast  07/01/2014   CLINICAL DATA:  64 year old hypertensive female with history of smoking presenting with left hand numbness and clumsiness. Small acute infarct right precentral cortex noted on recent MR. Subsequent encounter.  EXAM: MRA HEAD WITHOUT CONTRAST  TECHNIQUE: Angiographic images of the Circle of Willis were obtained using MRA technique without intravenous contrast.  COMPARISON:  07/01/2014 brain MR.  FINDINGS: Motion degradation contributes to appearance of branch vessel irregularity.  Mild narrowing right internal carotid artery cavernous/supraclinoid segment junction.  4.4 mm aneurysm suspected arising at the junction of the A1/ A2 segment of the  right anterior cerebral artery and directed inferiorly.  No high-grade stenosis of either carotid terminus or M1 segment of the middle cerebral artery.  Middle cerebral artery and A2 segment anterior cerebral artery branch vessel irregularity bilaterally may be related to atherosclerotic disease and/or result of motion artifact.  No significant stenosis of either distal vertebral artery.  Left posterior inferior cerebellar artery and right anterior inferior cerebellar artery not visualized.  No significant stenosis of the basilar artery.  Mild irregularity of the superior cerebellar artery and distal branches of the posterior cerebral artery bilaterally  IMPRESSION: 4.4 mm aneurysm suspected arising at the junction of the A1/ A2 segment of the right anterior cerebral artery and directed inferiorly.  Mild narrowing right internal carotid artery cavernous/supraclinoid segment junction.  No high-grade stenosis of either carotid terminus or M1 segment of the middle cerebral artery.  Branch vessel irregularity as detailed above.  These results will be called to the ordering clinician or representative by the Radiologist Assistant, and communication documented in the PACS or zVision Dashboard.   Electronically Signed   By: Genia Del M.D.   On: 07/01/2014 16:39    Scheduled Meds: .  stroke: mapping our early stages of recovery book   Does not apply Once  . amLODipine  2.5 mg Oral  QHS  . aspirin  300 mg Rectal Daily   Or  . aspirin  325 mg Oral Daily  . atorvastatin  20 mg Oral q1800  . enoxaparin (LOVENOX) injection  40 mg Subcutaneous Q24H  . hydrochlorothiazide  12.5 mg Oral Daily  . lisinopril  10 mg Oral QHS  . loratadine  10 mg Oral Daily  . oxybutynin  10 mg Oral QHS  . senna-docusate  1 tablet Oral QHS   Continuous Infusions: . sodium chloride    . 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 [facility] Pager [phone]. If 7PM-7AM, please contact night-coverage at [email or web address], password [facility] 07/03/2014, 2:21 PM  LOS: 2 days

## 2014-07-03 NOTE — H&P (View-Only) (Signed)
[facility] PROGRESS NOTE  Hannah Nielsen GYB:[id number] DOB: [birth year] DOA: 07/01/2014 PCP: [name], MD  Assessment/Plan: #1 acute CVA: Nondominant right high parietal cortical infarct embolic secondary to unknown source. Patient states left hand numbness clinically improving and close to baseline. MRI MRA of the head with right high parietal cortical infarct. 4.4 millimeter right A1/A2 aneurysm. Carotid Dopplers were done with no significant ICA stenosis. 2-D echo done with EF of 65-70%, no wall motion abnormalities, no cardiac source of emboli. Hemoglobin A1c is pending. Patient has been seen in consultation by neurology recommending a TEE to rule out embolic source. If TEE is positive for PFO will need bilateral lower extremity Dopplers to rule out DVT as a source of stroke. If TEE is negative original likely need placement of a implantable loop recorder to evaluate for atrial fibrillation as an etiology of stroke. Continue full dose aspirin for secondary stroke prevention. Risk factor modification. PT/OT.  #2 right ACA aneurysm Noted on MRA of the head which was 4.4 mm. Patient currently asymptomatic. Risk of rupture less than 1%.. Patient with family history of ruptured aneurysms in the past. Risk factor modification. Blood pressure management. Outpatient follow-up with neurosurgery for further evaluation and recommendations.  #3 hypertension Stable. Continue home regimen of Norvasc. Resume home regimen of lisinopril and HCTZ. Goal blood pressure systolic < 876.  #4 hyperlipidemia LDL of 112. Goal LDL less than 70. Patient has been started on low-dose statin. Outpatient follow-up.  #5 tobacco abuse Tobacco cessation.  #6 abnormal chest x-ray Outpatient CT chest for further evaluation. Patient currently asymptomatic.  #7 prophylaxis Lovenox for DVT prophylaxis.    Code Status: Full Family Communication: Updated patient and pastor at bedside. Disposition Plan:  Home when medically stable hopefully 1-2 days.   Consultants:  Neurology: Dr. Nicole Kindred 07/01/2014  Procedures:  2-D echo 07/02/2014  Carotid Dopplers 07/02/2014  Chest x-ray 07/01/2014  MRI/MRA head 07/01/2014  Antibiotics:  None  HPI/Subjective: Patient states numbness and tingling has improved significantly and close to her baseline. No complaints.  Objective: Filed Vitals:   07/02/14 1454  BP: 150/72  Pulse: 77  Temp: 98.2 F (36.8 C)  Resp: 18    Intake/Output Summary (Last 24 hours) at 07/02/14 1552 Last data filed at 07/02/14 1300  Gross per 24 hour  Intake    600 ml  Output      0 ml  Net    600 ml   Filed Weights   07/01/14 0733  Weight: 83.915 kg (185 lb)    Exam:   General:  NAD  Cardiovascular: RRR  Respiratory: CTAB  Abdomen: Soft/NT/ND/+BS  Musculoskeletal: No c/c/e  Data Reviewed: Basic Metabolic Panel:  Recent Labs Lab 07/01/14 0810 07/02/14 1136  NA 137 140  K 4.3 4.1  CL 101 107  CO2 27 23  GLUCOSE 99 75  BUN 15 11  CREATININE 0.73 0.58  CALCIUM 9.2 8.9   Liver Function Tests:  Recent Labs Lab 07/01/14 0810  AST 25  ALT 19  ALKPHOS 77  BILITOT 0.8  PROT 6.4  ALBUMIN 3.6   No results for input(s): LIPASE, AMYLASE in the last 168 hours. No results for input(s): AMMONIA in the last 168 hours. CBC:  Recent Labs Lab 07/01/14 0810 07/02/14 1136  WBC 10.3 9.3  NEUTROABS 7.0  --   HGB 15.1* 14.5  HCT 43.4 43.8  MCV 91.9 93.6  PLT 321 309   Cardiac Enzymes:  Recent Labs Lab 07/01/14 0810  TROPONINI <0.03  BNP (last 3 results) No results for input(s): BNP in the last 8760 hours.  ProBNP (last 3 results) No results for input(s): PROBNP in the last 8760 hours.  CBG:  Recent Labs Lab 07/01/14 2225 07/02/14 0645  GLUCAP 178* 106*    No results found for this or any previous visit (from the past 240 hour(s)).   Studies: Dg Chest 2 View  07/01/2014   CLINICAL DATA:  Stroke. Left hand  numbness. cough and congestion. Hypertension.  EXAM: CHEST  2 VIEW  COMPARISON:  None.  FINDINGS: Bandlike density in the left lower lobe and right middle lobe. Vague nodularity along the left basilar opacity. Atherosclerotic calcification of the aortic arch. Calcifications along the left hilum likely reflecting old granulomatous disease. Thoracic spondylosis is present.  IMPRESSION: 1. Bandlike opacities in the right middle lobe and left lower lobe, with some nodularity along the left lower lobe opacity. Given the patient's smoking history and lack of prior chest imaging to establish stability, I do recommend nonurgent chest CT followup to further characterize these opacities and exclude underlying malignancy. 2. Atherosclerosis.   Electronically Signed   By: Van Clines M.D.   On: 07/01/2014 15:01   Mr Hannah Nielsen Wo Contrast  07/01/2014   CLINICAL DATA:  64 year old hypertensive female with history of smoking presenting with left hand numbness and clumsiness. Small acute infarct right precentral cortex noted on recent MR. Subsequent encounter.  EXAM: MRA HEAD WITHOUT CONTRAST  TECHNIQUE: Angiographic images of the Circle of Willis were obtained using MRA technique without intravenous contrast.  COMPARISON:  07/01/2014 brain MR.  FINDINGS: Motion degradation contributes to appearance of branch vessel irregularity.  Mild narrowing right internal carotid artery cavernous/supraclinoid segment junction.  4.4 mm aneurysm suspected arising at the junction of the A1/ A2 segment of the right anterior cerebral artery and directed inferiorly.  No high-grade stenosis of either carotid terminus or M1 segment of the middle cerebral artery.  Middle cerebral artery and A2 segment anterior cerebral artery branch vessel irregularity bilaterally may be related to atherosclerotic disease and/or result of motion artifact.  No significant stenosis of either distal vertebral artery.  Left posterior inferior cerebellar artery and  right anterior inferior cerebellar artery not visualized.  No significant stenosis of the basilar artery.  Mild irregularity of the superior cerebellar artery and distal branches of the posterior cerebral artery bilaterally  IMPRESSION: 4.4 mm aneurysm suspected arising at the junction of the A1/ A2 segment of the right anterior cerebral artery and directed inferiorly.  Mild narrowing right internal carotid artery cavernous/supraclinoid segment junction.  No high-grade stenosis of either carotid terminus or M1 segment of the middle cerebral artery.  Branch vessel irregularity as detailed above.  These results will be called to the ordering clinician or representative by the Radiologist Assistant, and communication documented in the PACS or zVision Dashboard.   Electronically Signed   By: Genia Del M.D.   On: 07/01/2014 16:39   Mr Brain Wo Contrast  07/01/2014   CLINICAL DATA:  Left arm weakness and tingling began 2 a.m. today.  EXAM: MRI HEAD WITHOUT CONTRAST  TECHNIQUE: Multiplanar, multiecho pulse sequences of the brain and surrounding structures were obtained without intravenous contrast.  COMPARISON:  None.  FINDINGS: Small area of acute infarct in the right precentral cortex over the convexity. This appears to be in the precentral cortex with scattered small areas of patchy restricted diffusion. No other acute infarct.  Ventricle size normal.  Cerebral volume normal.  Negative for  intracranial hemorrhage.  Negative for mass or edema.  Paranasal sinuses are clear.  Enlargement of the sella which is filled with CSF. Possible arachnoid cyst versus empty sella. Pituitary normal in size.  IMPRESSION: Small area of acute infarct right precentral cortex over the convexity. Negative for hemorrhage.   Electronically Signed   By: Franchot Gallo M.D.   On: 07/01/2014 10:00    Scheduled Meds: .  stroke: mapping our early stages of recovery book   Does not apply Once  . amLODipine  2.5 mg Oral QHS  . aspirin   300 mg Rectal Daily   Or  . aspirin  325 mg Oral Daily  . atorvastatin  20 mg Oral q1800  . enoxaparin (LOVENOX) injection  40 mg Subcutaneous Q24H  . hydrochlorothiazide  12.5 mg Oral Daily  . lisinopril  10 mg Oral QHS  . loratadine  10 mg Oral Daily  . oxybutynin  10 mg Oral QHS  . senna-docusate  1 tablet Oral QHS   Continuous Infusions:   Principal Problem:   Stroke Active Problems:   Hypertension   Tobacco abuse   Abnormal CXR   Aneurysm of anterior cerebral artery: Right per MRI 07/01/14   Hyperlipidemia    Time spent: 44 minutes    Adaia Matthies M.D. [facility] Pager [phone]. If 7PM-7AM, please contact night-coverage at [email or web address], password [facility] 07/02/2014, 3:52 PM  LOS: 1 day

## 2014-07-03 NOTE — Interval H&P Note (Signed)
History and Physical Interval Note:  07/03/2014 7:06 AM  Hannah Nielsen  has presented today for surgery, with the diagnosis of stroke  The various methods of treatment have been discussed with the patient and family. After consideration of risks, benefits and other options for treatment, the patient has consented to  Procedure(s): TRANSESOPHAGEAL ECHOCARDIOGRAM (TEE) (N/A) as a surgical intervention .  The patient's history has been reviewed, patient examined, no change in status, stable for surgery.  I have reviewed the patient's chart and labs.  Questions were answered to the patient's satisfaction.     SKAINS, MARK

## 2014-07-03 NOTE — Consult Note (Signed)
ELECTROPHYSIOLOGY CONSULT NOTE  Patient ID: Hannah Nielsen MRN: [id number], DOB/AGE: [birth year]   Admit date: 07/01/2014 Date of Consult: 07/03/2014  Primary Physician: [name], MD Primary Cardiologist: new to [facility] Reason for Consultation: Cryptogenic stroke; recommendations regarding Implantable Loop Recorder  History of Present Illness Kashmir Leedy was admitted on 07/01/2014 with left hand numbness and clumsiness.  Imaging demonstrated right high parietal cortical infarct felt to be embolic secondary to unknown source.  She has undergone workup for stroke including echocardiogram and carotid dopplers.  The patient has been monitored on telemetry which has demonstrated sinus rhythm with no arrhythmias.  Inpatient stroke work-up is to be completed with a TEE.   Echocardiogram this admission demonstrated EF 65-70%, no RWMA, grade 1 diastolic dysfunction.  Lab work is reviewed  Prior to admission, the patient denies chest pain, shortness of breath, dizziness, palpitations, or syncope.  They are recovering from their stroke with plans to return home at discharge.  EP has been asked to evaluate for placement of an implantable loop recorder to monitor for atrial fibrillation.  ROS is negative except as outlined above.    Past Medical History  Diagnosis Date  . Hypertension   . Aneurysm of anterior cerebral artery: Right per MRI 07/01/14 07/02/2014     Surgical History:  Past Surgical History  Procedure Laterality Date  . Abdominal hysterectomy       Prescriptions prior to admission  Medication Sig Dispense Refill Last Dose  . acetaminophen (TYLENOL) 500 MG tablet Take 500-1,000 mg by mouth every 6 (six) hours as needed for mild pain or moderate pain.   over 30 days  . amLODipine (NORVASC) 2.5 MG tablet Take 2.5 mg by mouth at bedtime.    06/30/2014 at Unknown time  . aspirin 81 MG tablet Take 81 mg by mouth daily.   06/30/2014 at Unknown time  . CALCIUM PO Take  1 tablet by mouth daily.   06/30/2014 at Unknown time  . cetirizine (ZYRTEC) 10 MG tablet Take 10 mg by mouth at bedtime.   06/30/2014 at Unknown time  . hydrochlorothiazide (MICROZIDE) 12.5 MG capsule Take 12.5 mg by mouth daily.   06/30/2014 at Unknown time  . KRILL OIL PO Take 1 tablet by mouth daily.   06/30/2014 at Unknown time  . lisinopril (PRINIVIL,ZESTRIL) 10 MG tablet Take 10 mg by mouth at bedtime.    06/30/2014 at Unknown time  . LUTEIN PO Take 1 tablet by mouth daily.   06/30/2014 at Unknown time  . Multiple Vitamins-Minerals (MULTIVITAL) tablet Take 1 tablet by mouth daily.   06/30/2014 at Unknown time  . oxybutynin (DITROPAN-XL) 10 MG 24 hr tablet Take 10 mg by mouth at bedtime.   06/30/2014 at Unknown time    Inpatient Medications:  .  stroke: mapping our early stages of recovery book   Does not apply Once  . amLODipine  2.5 mg Oral QHS  . aspirin  300 mg Rectal Daily   Or  . aspirin  325 mg Oral Daily  . atorvastatin  20 mg Oral q1800  . enoxaparin (LOVENOX) injection  40 mg Subcutaneous Q24H  . hydrochlorothiazide  12.5 mg Oral Daily  . lisinopril  10 mg Oral QHS  . loratadine  10 mg Oral Daily  . oxybutynin  10 mg Oral QHS  . senna-docusate  1 tablet Oral QHS    Allergies:  Allergies  Allergen Reactions  . Sulfa Antibiotics     History   Social History  .  Marital Status: Divorced    Spouse Name: N/A  . Number of Children: N/A  . Years of Education: N/A   Occupational History  . Not on file.   Social History Main Topics  . Smoking status: Current Every Day Smoker  . Smokeless tobacco: Not on file  . Alcohol Use: Yes  . Drug Use: No  . Sexual Activity: Not on file   Other Topics Concern  . Not on file   Social History Narrative     Family History  Problem Relation Age of Onset  . Hypertension Mother   . Heart failure Mother     Deceased  . Hypertension Sister   . Hypertension Brother   . Cerebral aneurysm Father 67    Deceased     Physical  Exam: Filed Vitals:   07/02/14 1733 07/02/14 2110 07/03/14 0259 07/03/14 0651  BP: 143/69 138/67 142/70 137/72  Pulse: 79 74 70 80  Temp: 98.6 F (37 C) 97.8 F (36.6 C) 98 F (36.7 C) 97.9 F (36.6 C)  TempSrc: Oral Oral Oral Oral  Resp: 18 18 18 16   Weight:      SpO2: 96% 96% 94% 98%    GEN- The patient is well appearing, alert and oriented x 3 today.   Head- normocephalic, atraumatic Eyes-  Sclera clear, conjunctiva pink Ears- hearing intact Oropharynx- clear Neck- supple, Lungs- Clear to ausculation bilaterally, normal work of breathing Heart- Regular rate and rhythm, no murmurs, rubs or gallops, PMI not laterally displaced GI- soft, NT, ND, + BS Extremities- no clubbing, cyanosis, or edema MS- no significant deformity or atrophy Skin- no rash or lesion Psych- euthymic mood, full affect   Labs:   Lab Results  Component Value Date   WBC 9.3 07/02/2014   HGB 14.5 07/02/2014   HCT 43.8 07/02/2014   MCV 93.6 07/02/2014   PLT 309 07/02/2014    Recent Labs Lab 07/01/14 0810 07/02/14 1136  NA 137 140  K 4.3 4.1  CL 101 107  CO2 27 23  BUN 15 11  CREATININE 0.73 0.58  CALCIUM 9.2 8.9  PROT 6.4  --   BILITOT 0.8  --   ALKPHOS 77  --   ALT 19  --   AST 25  --   GLUCOSE 99 75    Radiology/Studies: Dg Chest 2 View 07/01/2014   CLINICAL DATA:  Stroke. Left hand numbness. cough and congestion. Hypertension.  EXAM: CHEST  2 VIEW  COMPARISON:  None.  FINDINGS: Bandlike density in the left lower lobe and right middle lobe. Vague nodularity along the left basilar opacity. Atherosclerotic calcification of the aortic arch. Calcifications along the left hilum likely reflecting old granulomatous disease. Thoracic spondylosis is present.  IMPRESSION: 1. Bandlike opacities in the right middle lobe and left lower lobe, with some nodularity along the left lower lobe opacity. Given the patient's smoking history and lack of prior chest imaging to establish stability, I do recommend  nonurgent chest CT followup to further characterize these opacities and exclude underlying malignancy. 2. Atherosclerosis.   Electronically Signed   By: Van Clines M.D.   On: 07/01/2014 15:01   Mr [name] Wo Contrast 07/01/2014   CLINICAL DATA:  64 year old hypertensive female with history of smoking presenting with left hand numbness and clumsiness. Small acute infarct right precentral cortex noted on recent MR. Subsequent encounter.  EXAM: MRA HEAD WITHOUT CONTRAST  TECHNIQUE: Angiographic images of the Circle of Willis were obtained using MRA technique without intravenous  contrast.  COMPARISON:  07/01/2014 brain MR.  FINDINGS: Motion degradation contributes to appearance of branch vessel irregularity.  Mild narrowing right internal carotid artery cavernous/supraclinoid segment junction.  4.4 mm aneurysm suspected arising at the junction of the A1/ A2 segment of the right anterior cerebral artery and directed inferiorly.  No high-grade stenosis of either carotid terminus or M1 segment of the middle cerebral artery.  Middle cerebral artery and A2 segment anterior cerebral artery branch vessel irregularity bilaterally may be related to atherosclerotic disease and/or result of motion artifact.  No significant stenosis of either distal vertebral artery.  Left posterior inferior cerebellar artery and right anterior inferior cerebellar artery not visualized.  No significant stenosis of the basilar artery.  Mild irregularity of the superior cerebellar artery and distal branches of the posterior cerebral artery bilaterally  IMPRESSION: 4.4 mm aneurysm suspected arising at the junction of the A1/ A2 segment of the right anterior cerebral artery and directed inferiorly.  Mild narrowing right internal carotid artery cavernous/supraclinoid segment junction.  No high-grade stenosis of either carotid terminus or M1 segment of the middle cerebral artery.  Branch vessel irregularity as detailed above.  These results  will be called to the ordering clinician or representative by the Radiologist Assistant, and communication documented in the PACS or zVision Dashboard.   Electronically Signed   By: Genia Del M.D.   On: 07/01/2014 16:39   12-lead ECG sinus rhythm, rate 71, normal intervals All prior EKG's in EPIC reviewed with no documented atrial fibrillation  Telemetry sinus rhythm  Assessment and Plan:  1. Cryptogenic stroke The patient presents with cryptogenic stroke.  The patient has a TEE planned for this AM.  I spoke at length with the patient about monitoring for afib with an implantable loop recorder.  Risks, benefits, and alteratives to implantable loop recorder were discussed with the patient today.   At this time, the patient is very clear in their decision to proceed with implantable loop recorder.    Chanetta Marshall, NP 07/03/2014 7:11 AM   TEE >> LA mass so loop recorder will not be pursued  Call for futher qustoins

## 2014-07-03 NOTE — Progress Notes (Signed)
  Echocardiogram Echocardiogram Transesophageal has been performed.  Joelene Millin 07/03/2014, 9:02 AM

## 2014-07-04 ENCOUNTER — Encounter (HOSPITAL_COMMUNITY)
Admission: EM | Disposition: A | Discharge status: Home / Self Care | Payer: Self-pay | Source: Home / Self Care | Attending: Internal Medicine

## 2014-07-04 ENCOUNTER — Encounter (HOSPITAL_COMMUNITY): Payer: Self-pay | Admitting: Internal Medicine

## 2014-07-04 ENCOUNTER — Inpatient Hospital Stay (HOSPITAL_COMMUNITY): Payer: BC Managed Care – PPO

## 2014-07-04 DIAGNOSIS — IMO0002 Reserved for concepts with insufficient information to code with codable children: Secondary | ICD-10-CM | POA: Diagnosis present

## 2014-07-04 DIAGNOSIS — R229 Localized swelling, mass and lump, unspecified: Secondary | ICD-10-CM

## 2014-07-04 DIAGNOSIS — Z01810 Encounter for preprocedural cardiovascular examination: Secondary | ICD-10-CM

## 2014-07-04 DIAGNOSIS — I639 Cerebral infarction, unspecified: Secondary | ICD-10-CM

## 2014-07-04 DIAGNOSIS — I2510 Atherosclerotic heart disease of native coronary artery without angina pectoris: Secondary | ICD-10-CM

## 2014-07-04 HISTORY — DX: Atherosclerotic heart disease of native coronary artery without angina pectoris: I25.10

## 2014-07-04 HISTORY — PX: LEFT HEART CATHETERIZATION WITH CORONARY ANGIOGRAM: SHX5451

## 2014-07-04 LAB — CBC
HCT: 44.7 % (ref 36.0–46.0)
Hemoglobin: 15.2 g/dL — ABNORMAL HIGH (ref 12.0–15.0)
MCH: 31.1 pg (ref 26.0–34.0)
MCHC: 34 g/dL (ref 30.0–36.0)
MCV: 91.6 fL (ref 78.0–100.0)
PLATELETS: 313 10*3/uL (ref 150–400)
RBC: 4.88 MIL/uL (ref 3.87–5.11)
RDW: 13.3 % (ref 11.5–15.5)
WBC: 9.5 10*3/uL (ref 4.0–10.5)

## 2014-07-04 LAB — BASIC METABOLIC PANEL
Anion gap: 8 (ref 5–15)
BUN: 11 mg/dL (ref 6–23)
CALCIUM: 9.1 mg/dL (ref 8.4–10.5)
CO2: 27 mmol/L (ref 19–32)
CREATININE: 0.63 mg/dL (ref 0.50–1.10)
Chloride: 104 mmol/L (ref 96–112)
GFR calc Af Amer: >90 mL/min (ref >90)
GFR calc non Af Amer: >90 mL/min (ref >90)
Glucose, Bld: 108 mg/dL — ABNORMAL HIGH (ref 70–99)
Potassium: 3.8 mmol/L (ref 3.5–5.1)
Sodium: 139 mmol/L (ref 135–145)

## 2014-07-04 SURGERY — LEFT HEART CATHETERIZATION WITH CORONARY ANGIOGRAM
Anesthesia: LOCAL

## 2014-07-04 MED ORDER — SODIUM CHLORIDE 0.9 % IV SOLN
INTRAVENOUS | Status: DC
  Administered 2014-07-04: 13:00:00 via INTRAVENOUS

## 2014-07-04 MED ORDER — ASPIRIN 81 MG PO CHEW
81.0000 mg | CHEWABLE_TABLET | ORAL | Status: AC
  Administered 2014-07-04: 81 mg via ORAL
  Filled 2014-07-04: qty 1

## 2014-07-04 MED ORDER — SODIUM CHLORIDE 0.9 % IJ SOLN
3.0000 mL | Freq: Two times a day (BID) | INTRAMUSCULAR | Status: DC
Start: 2014-07-04 — End: 2014-07-04
  Administered 2014-07-04: 3 mL via INTRAVENOUS

## 2014-07-04 MED ORDER — VERAPAMIL HCL 2.5 MG/ML IV SOLN
INTRAVENOUS | Status: AC
  Filled 2014-07-04: qty 2

## 2014-07-04 MED ORDER — LORAZEPAM 0.5 MG PO TABS
0.5000 mg | ORAL_TABLET | Freq: Once | ORAL | Status: AC
  Administered 2014-07-04: 0.5 mg via ORAL
  Filled 2014-07-04: qty 1

## 2014-07-04 MED ORDER — SODIUM CHLORIDE 0.9 % IV SOLN: 250.0000 mL | INTRAVENOUS | Status: DC | PRN

## 2014-07-04 MED ORDER — SODIUM CHLORIDE 0.9 % IV SOLN
1.0000 mL/kg/h | INTRAVENOUS | Status: DC
  Administered 2014-07-04: 1 mL/kg/h via INTRAVENOUS

## 2014-07-04 MED ORDER — MIDAZOLAM HCL 2 MG/2ML IJ SOLN
INTRAMUSCULAR | Status: AC
  Filled 2014-07-04: qty 2

## 2014-07-04 MED ORDER — HEPARIN (PORCINE) IN NACL 2-0.9 UNIT/ML-% IJ SOLN
INTRAMUSCULAR | Status: AC
  Filled 2014-07-04: qty 1000

## 2014-07-04 MED ORDER — LIDOCAINE HCL (PF) 1 % IJ SOLN
INTRAMUSCULAR | Status: AC
  Filled 2014-07-04: qty 30

## 2014-07-04 MED ORDER — FENTANYL CITRATE 0.05 MG/ML IJ SOLN
INTRAMUSCULAR | Status: AC
  Filled 2014-07-04: qty 2

## 2014-07-04 MED ORDER — SODIUM CHLORIDE 0.9 % IJ SOLN: 3.0000 mL | INTRAMUSCULAR | Status: DC | PRN

## 2014-07-04 MED ORDER — NITROGLYCERIN 1 MG/10 ML FOR IR/CATH LAB
INTRA_ARTERIAL | Status: AC
  Filled 2014-07-04: qty 10

## 2014-07-04 MED ORDER — GADOBENATE DIMEGLUMINE 529 MG/ML IV SOLN
30.0000 mL | Freq: Once | INTRAVENOUS | Status: AC
  Administered 2014-07-04: 27 mL via INTRAVENOUS

## 2014-07-04 NOTE — Progress Notes (Signed)
Site area: rt groin Site Prior to Removal:  Level   0 Pressure Applied For:  25 minutes Manual:   yes Patient Status During Pull:  stable Post Pull Site:  Level  0 Post Pull Instructions Given:  yes Post Pull Pulses Present: yes Dressing Applied:  tegaderm Bedrest begins @ 5909 Comments:  0 complications

## 2014-07-04 NOTE — Progress Notes (Signed)
Occupational Therapy Treatment Patient Details Name: Hannah Nielsen MRN: [id number] DOB: April 06, 1951 Today's Date: 07/04/2014    History of present illness Hannah Nielsen is an 64 y.o. female with history of HTN and tobacco abuse. patient went to sleep last night and awoke at around 2 AM. She states she realized her left hand felt numb and clumsy. She went back to sleep and upon waking her left hand continued to have decreased sensation and feel clumsy. She drove herself to ED and MRI was obtained showing a right precentral cortex infarct. Currently she has clumsiness of her left hand and decreased sensation oover the small and ring finger   OT comments  Pt making progress with functional goals, independent with ADLs and ADL mobility. No further acute OT indicated at this time. OT will sign off  Follow Up Recommendations  Outpatient OT    Equipment Recommendations  None recommended by OT    Recommendations for Other Services      Precautions / Restrictions Precautions Precautions: None Restrictions Weight Bearing Restrictions: No       Mobility Bed Mobility                  Transfers Overall transfer level: Independent Equipment used: None             General transfer comment: no assist, no LOB    Balance Overall balance assessment: No apparent balance deficits (not formally assessed)                                 ADL       Grooming: Wash/dry face;Wash/dry hands;Independent;Standing                   Toilet Transfer: Ambulation;Comfort height toilet;Modified Independent   Toileting- Clothing Manipulation and Hygiene: Modified independent         General ADL Comments: pt ambulating around in room and hallway independently      Vision  no change from baseline                              Cognition   Behavior During Therapy: [facility] for tasks assessed/performed Overall Cognitive Status: Within Functional Limits for  tasks assessed                       Extremity/Trunk Assessment   WFL                        General Comments  pt pleasant and cooperative    Pertinent Vitals/ Pain       Pain Assessment: No/denies pain  Home Living  home alone                                        Prior Functioning/Environment  independent           Frequency Min 2X/week     Progress Toward Goals  OT Goals(current goals can now be found in the care plan section)  Progress towards OT goals: Progressing toward goals  Acute Rehab OT Goals Patient Stated Goal: to go home  Plan  home                    End of  Session     Activity Tolerance Patient tolerated treatment well   Patient Left with call bell/phone within reach;with family/visitor present;in chair             Time: 3570-1779 OT Time Calculation (min): 12 min  Charges: OT General Charges $OT Visit: 1 Procedure OT Treatments $Self Care/Home Management : 8-22 mins  Britt Bottom 07/04/2014, 1:51 PM

## 2014-07-04 NOTE — Progress Notes (Signed)
STROKE TEAM PROGRESS NOTE   HISTORY Hannah Nielsen is an 64 y.o. female with history of HTN and tobacco abuse. patient went to sleep last night (LKW 06/30/2014, time unknown) and awoke at around 2 AM 07/01/2014. She states she realized her left hand felt numb and clumsy. She went back to sleep and upon waking her left hand continued to have decreased sensation and feel clumsy. She drove herself to ED and MRI was obtained showing a right precentral cortex infarct. Currently she has clumsiness of her left hand and decreased sensation oover the small and ring finger. Patient was not administered TPA secondary to delay in arrival. She was admitted for further evaluation and treatment.   SUBJECTIVE (INTERVAL HISTORY) Family members present. Heart catheterization planned due to possible need for surgery for possible tumor resection.   OBJECTIVE Temp:  [97.7 F (36.5 C)-98.4 F (36.9 C)] 97.7 F (36.5 C) (03/25 1344) Pulse Rate:  [68-78] 68 (03/25 1344) Cardiac Rhythm:  [-] Normal sinus rhythm (03/25 0810) Resp:  [18-22] 18 (03/25 1344) BP: (122-142)/(66-72) 131/67 mmHg (03/25 1344) SpO2:  [94 %-95 %] 95 % (03/25 1344)   Recent Labs Lab 07/01/14 2225 07/02/14 0645  GLUCAP 178* 106*    Recent Labs Lab 07/01/14 0810 07/02/14 1136 07/03/14 0720 07/04/14 0550  NA 137 140 140 139  K 4.3 4.1 3.9 3.8  CL 101 107 107 104  CO2 27 23 25 27   GLUCOSE 99 75 104* 108*  BUN 15 11 12 11   CREATININE 0.73 0.58 0.56 0.63  CALCIUM 9.2 8.9 9.1 9.1    Recent Labs Lab 07/01/14 0810  AST 25  ALT 19  ALKPHOS 77  BILITOT 0.8  PROT 6.4  ALBUMIN 3.6    Recent Labs Lab 07/01/14 0810 07/02/14 1136 07/03/14 0720 07/04/14 0550  WBC 10.3 9.3 10.0 9.5  NEUTROABS 7.0  --   --   --   HGB 15.1* 14.5 15.8* 15.2*  HCT 43.4 43.8 47.0* 44.7  MCV 91.9 93.6 92.9 91.6  PLT 321 309 306 313    Recent Labs Lab 07/01/14 0810  TROPONINI <0.03   No results for input(s): LABPROT, INR in the last 72  hours. No results for input(s): COLORURINE, LABSPEC, [place], GLUCOSEU, HGBUR, BILIRUBINUR, KETONESUR, PROTEINUR, UROBILINOGEN, NITRITE, LEUKOCYTESUR in the last 72 hours.  Invalid input(s): APPERANCEUR     Component Value Date/Time   CHOL 197 07/02/2014 0628   TRIG 249* 07/02/2014 0628   HDL 35* 07/02/2014 0628   CHOLHDL 5.6 07/02/2014 0628   VLDL 50* 07/02/2014 0628   LDLCALC 112* 07/02/2014 0628   Lab Results  Component Value Date   HGBA1C 6.3* 07/02/2014      Component Value Date/Time   LABOPIA NONE DETECTED 07/01/2014 0821   COCAINSCRNUR NONE DETECTED 07/01/2014 0821   LABBENZ NONE DETECTED 07/01/2014 0821   AMPHETMU NONE DETECTED 07/01/2014 0821   THCU NONE DETECTED 07/01/2014 0821   LABBARB NONE DETECTED 07/01/2014 0821     Recent Labs Lab 07/01/14 0810  ETH <5    Mr Card Morphology Wo/w Cm 07/04/2014    1. Normal left ventricular size with moderate concentric hypertrophy and normal systolic function (LVEF = 62%). No regional wall motion abnormalities.   2. Normal right ventricular size, thickness and systolic function (RVEF= 62%).   3.  Normal biatrial size.   4.  Trivial mitral regurgitation.   5. There is a small mobile density measuring 13 x 8 mm attached to the lateral wall of the left  atrium located between left upper and left lower pulmonary veins. There is no obvious penetration into the wall. There is no other mass seen anywhere else in the heart. 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  Unfortunately, the size of the mass and mobile character didn't allow further tissue characterization.      2D Echocardiogram  - Left ventricle: The cavity size was normal. There was mild concentric hypertrophy. Systolic function was vigorous. The estimated ejection fraction was in the range of 65% to 70%. Wall motion was normal; there were no regional wall motion abnormalities. Doppler parameters are consistent with abnormal left ventricular relaxation.  No cardiac source of emboli was indentified.  TEE  Intracardiac mass (1 x 1.3cm) in lateral left atrium. Could represent myxoma with fingerlike thrombogenic material.  Normal EF.  Trace MR.  Aortic atherosclerosis (moderate).  Mildly positive bubble contrast study with very few bubble cross over from right to left atrium during Valsalva maenuver.    PHYSICAL EXAM Pleasant middle aged 4 lady not in distress. . Afebrile. Head is nontraumatic. Neck is supple without bruit.    Cardiac exam no murmur or gallop. Lungs are clear to auscultation. Distal pulses are well felt. Neurological Exam ;  Awake  Alert oriented x 3. Normal speech and language.eye movements full without nystagmus.fundi were not visualized. Vision acuity and fields appear normal. Hearing is normal. Palatal movements are normal. Face symmetric. Tongue midline. Normal strength, tone, reflexes and coordination. Normal sensation. Gait deferred.   ASSESSMENT/PLAN [name] is a 64 y.o. female with history of HTN and tobacco abuse presenting with left hand felt numbness and clumsiness. She did not receive IV t-PA due to delay in arrival.   Stroke:  Non-dominant right high parietal cortical infarct, R MCA branch, embolic found to be secondary to probably LA cardiac mass  Resultant  Mild L hand numbness - nearly completely resolved  MRI  Right high parietal cortical infarct  MRA  No large vessel stenosis,  4.4 mm R A1/ A2 aneurysm    Cardiac MRI - as noted above  Carotid Doppler - No significant extracranial carotid artery stenosis demonstrated. Vertebrals are patent with antegrade flow.  The left ICA appeared tortuous, mildly elevating velocities. The bilateral ECAs demonstrated elevated velocities  consistent with severe stenosis.  2D Echo  No source of embolus  TEE w/ intracardiac mass, probable PFO. Cardiac surgeon has been consulted  Will cancel implantable loop recorder   HgbA1c 6.3, at goal  Lovenox 40 mg  sq daily for VTE prophylaxis Diet NPO time specified  Diet NPO time specified Except for: Sips with Meds   aspirin 81 mg orally every day prior to admission, now on aspirin 325 mg orally every day  Patient counseled to be compliant with her antithrombotic medications  Ongoing aggressive stroke risk factor management  Therapy recommendations:  OP OT, no PT  Disposition:  Anticipate return home with OP therapies  From Stroke standpoint, symptoms have almost totally resolved. Stroke overall very small. [place] for surgery in 1-2 weeks if needed.  R ACA aneurysm  4.4 mm (failrly small)  Incidental finding, asymptomatic  Risk of rupture < 1% per year  Family hx ruptured aneurysms - pts father and her father's mother  Consider treatment options as an OP. Not a candidate for treatment at this time given current stroke. Will complete stroke workup first. Patient given options of radiology or neurosurgery consult - family desires OP neurosurgery consult. Recommend referral to Dr. [name]  from neurosurgery as an outpatient.  Hypertension  Home meds:   Noravx, HCTZ, lisinopril  Stable  Keep BP <130/90  Hyperlipidemia  Home meds:  No statin  LDL 112, goal < 70  Added statin, lipitor 20  Continue statin at discharge  Other Stroke Risk Factors  Cigarette smoker, advised to stop smoking  ETOH use  ? Obesity, There is no height on file to calculate BMI.  Patient advised on a healthy diet by Dr. Leonie Man  Possible obstructive sleep apnea. Needs OP eval. Dr. Leonie Man will arrange as an OP.  Abnormal CXR  Bandlike opacities in the right middle lobe and left lower lobe, with some nodularity along the left lower lobe opacity  Hx cigarette smoking  Plan OP [facility] day # 3  RINEHULS, [place] for Pager information 07/04/2014 2:52 PM    I'll discuss the cardiac MRI results with Dr. Harrington Challenger and agree with plans to do cardiac catheterization  today for risk stratification prior to open-heart surgery next week for removal of left atrial tumor. Continue aspirin 81 mg daily. Antony Contras, MD       To contact Stroke Continuity provider, please refer to [email or web address] After hours, contact General Neurology

## 2014-07-04 NOTE — Progress Notes (Signed)
[name] is still down in MRI I spoke with daughter. MRI today shows small mass along lateral wall of left atrium  No other masses Discussed with Liane Comber who is still evaluating  Diffiuclt to characterize tissue due to size and mobility  ? Fibroelastoma.  Final interpretation/evaluation pending  I reviewed with P Sethi.   Since this may have been source of embolus, with thought of possible surgical removal, will need L heart cath.  Per Dr Leonie Man surgery next week is OK from neuro standpoint  Would rec ASA until then     Will tentatively plan on cath for today    Hannah Nielsen

## 2014-07-04 NOTE — CV Procedure (Signed)
    PROCEDURE:  Left heart catheterization with selective coronary angiography,  INDICATIONS:  Preoperative eval for cardiac surgery, left atrial mass  The risks, benefits, and details of the procedure were explained to the patient.  The patient verbalized understanding and wanted to proceed.  Informed written consent was obtained.  PROCEDURE TECHNIQUE:  After Xylocaine anesthesia, right radial access was attempted. The right radial artery was punctured with a needle but we are unable to pass a wire. Multiple attempts were performed and the radial artery was accessed several times but were unable to pass the wire. Ultrasound guidance was then obtained. Did appear that the radial artery was somewhat small but was again punctured without success in advancing the wire. The radial artery site was bandaged and the femoral site was prepped. After Xylocaine anesthesia a 35F sheath was placed in the right femoral artery with a single anterior needle wall stick.   Left coronary angiography was done using a Judkins L4 guide catheter.  Right coronary angiography was done using a Judkins R4 guide catheter.  Left heart cath was done using a JR4 catheter.    CONTRAST:  Total of 45 cc.  COMPLICATIONS:  None.    HEMODYNAMICS:  Aortic pressure was 127/66; LV pressure was 125/0; LVEDP 2.  There was no gradient between the left ventricle and aorta.    ANGIOGRAPHIC DATA:   The left main coronary artery is a large vessel with mild distal disease.  The left anterior descending artery is a large vessel with an 95% ostial stenosis.  There are several small to medium size diagonal vessels which appear patent. There is mild, diffuse disease in the LAD.  The left circumflex artery is a large vessel. There is mild disease in the proximal to mid vessel. The first obtuse marginal is large . At the origin of the second obtuse marginal, there is moderate disease across the origin of this vessel, up to 40- 50%.  The right  coronary artery is a medium sized dominant vessel. In the mid segment, there is diffuse disease, up to 75%. The posterior descending artery is medium-sized and patent. The posterior lateral artery is medium-sized and patent.  LEFT VENTRICULOGRAM:  Left ventricular angiogram was not done.  LVEDP was 2 mmHg.  IMPRESSIONS:  1. Mild disease in the distal left main coronary artery. 2. Severe disease in the ostial left anterior descending artery.. 3. Mild to moderate disease in the left circumflex artery and its branches. 4. Severe disease in the mid right coronary artery. 5. Left ventricular systolic function not assessed.  LVEDP 2 mmHg.  6. Unable to access right radial artery even with ultrasound guidance due to vasospasm.   RECOMMENDATION:  She has severe two-vessel coronary artery disease including the ostial LAD and mid right coronary artery. At the time of her cardiac surgery, she would benefit from LIMA to LAD.  I would recommend keeping her in the hospital until her surgery.  Continue aggressive secondary prevention. I will defer the decision about anticoagulation until surgery to neurology. At this point, the patient is stable and without angina currently.

## 2014-07-04 NOTE — Progress Notes (Signed)
Dr Roxy Manns is aware of cath and MRI findings.    His plan is to operate next week, probably Thursday.  He will be back at hospital Tuesday.

## 2014-07-04 NOTE — Progress Notes (Signed)
[facility] PROGRESS NOTE  Hannah Nielsen ENI:[id number] DOB: May 05, 1950 DOA: 07/01/2014 PCP: [name], MD  Assessment/Plan: #1 acute CVA: Nondominant right high parietal cortical infarct embolic secondary to unknown source. Patient states left hand numbness clinically improving and close to baseline. MRI MRA of the head with right high parietal cortical infarct. 4.4 millimeter right A1/A2 aneurysm. Carotid Dopplers were done with no significant ICA stenosis. 2-D echo done with EF of 65-70%, no wall motion abnormalities, no cardiac source of emboli. Hemoglobin A1c is pending. Patient has been seen in consultation by neurology recommending a TEE to rule out embolic source. 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 Patient was seen on behalf of CT surgery by Dr. Harrington Challenger of cardiology and patient had MR cardiac today with preliminary findings of small mass along lateral wall of the left atrium with final interpretation pending. Per neurology patient should be okay for surgery in 1-2 weeks. Per cardiology no need for loop recorder at this time. Continue full dose aspirin for secondary stroke prevention. Risk factor modification. PT/OT. Neurology following.  #2. Atrial myxoma vs Atrial mass(?fibroelastoma) Per cardiology TEE with probable atrial myxoma. Could be likely etiology of patient's stroke. Patient was seen by Dr. Harrington Challenger per CT surgery regarding left atrial mass. Patient had cardiac MRI this morning with preliminary findings per cardiology of a small mass along the lateral wall of the left atrium concern for possible fibroblastoma with final interpretation/evaluation pending. Per neurology patient is okay for surgery from a neuro standpoint in 1-2 weeks. Patient to be continued on aspirin. Patient for cardiac catheterization later on today per cardiology for probable preop clearance for possible surgical removal.    #3 right ACA  aneurysm Noted on MRA of the head which was 4.4 mm. Patient currently asymptomatic. Risk of rupture less than 1%.. Patient with family history of ruptured aneurysms in the past. Risk factor modification. Blood pressure management. Outpatient follow-up with neurosurgery for further evaluation and recommendations.  #4 hypertension Stable. Continue home regimen of Norvasc. Resumed home regimen of lisinopril and HCTZ. Goal blood pressure systolic < 614.  #5 hyperlipidemia LDL of 112. Goal LDL less than 70. Patient has been started on low-dose statin. Outpatient follow-up.  #6 tobacco abuse Tobacco cessation.  #7 abnormal chest x-ray Outpatient CT chest for further evaluation. Patient currently asymptomatic.  #8 Pre diabetes Diet and lifestyle modification  #9 prophylaxis Lovenox for DVT prophylaxis.    Code Status: Full Family Communication: Updated patient and daughter at bedside. Disposition Plan: Home when medically stable hopefully 1-2 days.   Consultants:  Neurology: Dr. Nicole Kindred 07/01/2014  Cardiology: Dr  Marlou Porch 07/02/14, Dr Harrington Challenger 07/03/14  Procedures:  2-D echo 07/02/2014  Carotid Dopplers 07/02/2014  Chest x-ray 07/01/2014  MRI/MRA head 07/01/2014   MR Cardiac 07/04/14  Antibiotics:  None  HPI/Subjective: Patient states numbness and tingling has improved significantly and close to her baseline. No CP, No SOB. No complaints.  Objective: Filed Vitals:   07/04/14 0536  BP: 125/72  Pulse: 72  Temp: 97.7 F (36.5 C)  Resp: 18   No intake or output data in the 24 hours ending 07/04/14 1128 Filed Weights   07/01/14 0733  Weight: 83.915 kg (185 lb)    Exam:   General:  NAD  Cardiovascular: RRR  Respiratory: CTAB  Abdomen: Soft/NT/ND/+BS  Musculoskeletal: No c/c/e  Data Reviewed: Basic Metabolic Panel:  Recent Labs Lab 07/01/14 0810 07/02/14 1136 07/03/14 0720 07/04/14 0550  NA  137 140 140 139  K 4.3 4.1 3.9 3.8  CL 101 107 107 104   CO2 27 23 25 27   GLUCOSE 99 75 104* 108*  BUN 15 11 12 11   CREATININE 0.73 0.58 0.56 0.63  CALCIUM 9.2 8.9 9.1 9.1   Liver Function Tests:  Recent Labs Lab 07/01/14 0810  AST 25  ALT 19  ALKPHOS 77  BILITOT 0.8  PROT 6.4  ALBUMIN 3.6   No results for input(s): LIPASE, AMYLASE in the last 168 hours. No results for input(s): AMMONIA in the last 168 hours. CBC:  Recent Labs Lab 07/01/14 0810 07/02/14 1136 07/03/14 0720 07/04/14 0550  WBC 10.3 9.3 10.0 9.5  NEUTROABS 7.0  --   --   --   HGB 15.1* 14.5 15.8* 15.2*  HCT 43.4 43.8 47.0* 44.7  MCV 91.9 93.6 92.9 91.6  PLT 321 309 306 313   Cardiac Enzymes:  Recent Labs Lab 07/01/14 0810  TROPONINI <0.03   BNP (last 3 results) No results for input(s): BNP in the last 8760 hours.  ProBNP (last 3 results) No results for input(s): PROBNP in the last 8760 hours.  CBG:  Recent Labs Lab 07/01/14 2225 07/02/14 0645  GLUCAP 178* 106*    No results found for this or any previous visit (from the past 240 hour(s)).   Studies: No results found.  Scheduled Meds: .  stroke: mapping our early stages of recovery book   Does not apply Once  . amLODipine  2.5 mg Oral QHS  . aspirin  300 mg Rectal Daily   Or  . aspirin  325 mg Oral Daily  . atorvastatin  20 mg Oral q1800  . enoxaparin (LOVENOX) injection  40 mg Subcutaneous Q24H  . hydrochlorothiazide  12.5 mg Oral Daily  . lisinopril  10 mg Oral QHS  . loratadine  10 mg Oral Daily  . oxybutynin  10 mg Oral QHS  . senna-docusate  1 tablet Oral QHS   Continuous Infusions: . sodium chloride 20 mL/hr at 07/03/14 0845  . 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 [facility] Pager [phone]. If 7PM-7AM, please contact night-coverage at [email or web address], password [facility] 07/04/2014, 11:28 AM  LOS: 3 days

## 2014-07-04 NOTE — H&P (View-Only) (Signed)
[name] is still down in MRI I spoke with daughter. MRI today shows small mass along lateral wall of left atrium  No other masses Discussed with Liane Comber who is still evaluating  Diffiuclt to characterize tissue due to size and mobility  ? Fibroelastoma.  Final interpretation/evaluation pending  I reviewed with P Sethi.   Since this may have been source of embolus, with thought of possible surgical removal, will need L heart cath.  Per Dr Leonie Man surgery next week is OK from neuro standpoint  Would rec ASA until then     Will tentatively plan on cath for today    Dorris Carnes

## 2014-07-04 NOTE — Interval H&P Note (Signed)
History and Physical Interval Note:  07/04/2014 2:56 PM  Hannah Nielsen  has presented today for surgery, with the diagnosis of cp  The various methods of treatment have been discussed with the patient and family. After consideration of risks, benefits and other options for treatment, the patient has consented to  Procedure(s): LEFT HEART CATHETERIZATION WITH CORONARY ANGIOGRAM (N/A) as a surgical intervention .  The patient's history has been reviewed, patient examined, no change in status, stable for surgery.  I have reviewed the patient's chart and labs.  Questions were answered to the patient's satisfaction.     Alize Acy S.

## 2014-07-05 DIAGNOSIS — D151 Benign neoplasm of heart: Secondary | ICD-10-CM | POA: Diagnosis present

## 2014-07-05 LAB — COMPREHENSIVE METABOLIC PANEL
ALBUMIN: 3.5 g/dL (ref 3.5–5.2)
ALT: 22 U/L (ref 0–35)
ANION GAP: 8 (ref 5–15)
AST: 18 U/L (ref 0–37)
Alkaline Phosphatase: 75 U/L (ref 39–117)
BILIRUBIN TOTAL: 0.9 mg/dL (ref 0.3–1.2)
BUN: 14 mg/dL (ref 6–23)
CO2: 26 mmol/L (ref 19–32)
Calcium: 9.3 mg/dL (ref 8.4–10.5)
Chloride: 105 mmol/L (ref 96–112)
Creatinine, Ser: 0.61 mg/dL (ref 0.50–1.10)
GFR calc Af Amer: >90 mL/min (ref >90)
GFR calc non Af Amer: >90 mL/min (ref >90)
GLUCOSE: 102 mg/dL — AB (ref 70–99)
POTASSIUM: 3.7 mmol/L (ref 3.5–5.1)
SODIUM: 139 mmol/L (ref 135–145)
Total Protein: 6.7 g/dL (ref 6.0–8.3)

## 2014-07-05 LAB — CBC
HEMATOCRIT: 45.8 % (ref 36.0–46.0)
Hemoglobin: 15.6 g/dL — ABNORMAL HIGH (ref 12.0–15.0)
MCH: 31.2 pg (ref 26.0–34.0)
MCHC: 34.1 g/dL (ref 30.0–36.0)
MCV: 91.6 fL (ref 78.0–100.0)
PLATELETS: 311 10*3/uL (ref 150–400)
RBC: 5 MIL/uL (ref 3.87–5.11)
RDW: 13.1 % (ref 11.5–15.5)
WBC: 11.7 10*3/uL — ABNORMAL HIGH (ref 4.0–10.5)

## 2014-07-05 MED ORDER — ACETAMINOPHEN 325 MG PO TABS: 650.0000 mg | ORAL_TABLET | ORAL | Status: DC | PRN

## 2014-07-05 MED ORDER — ENOXAPARIN SODIUM 40 MG/0.4ML ~~LOC~~ SOLN
40.0000 mg | SUBCUTANEOUS | Status: AC
Start: 2014-07-05 — End: 2014-07-09
  Administered 2014-07-05 – 2014-07-09 (×5): 40 mg via SUBCUTANEOUS
  Filled 2014-07-05 (×6): qty 0.4

## 2014-07-05 MED ORDER — ONDANSETRON HCL 4 MG/2ML IJ SOLN: 4.0000 mg | Freq: Four times a day (QID) | INTRAMUSCULAR | Status: DC | PRN

## 2014-07-05 NOTE — Progress Notes (Signed)
The stroke team will sign off at this time. Please call if we can be of further service. Follow-up with Dr. Leonie Man in 2 months.  Mikey Bussing PA-C [facility] Pager [phone] 07/05/2014, 8:50 AM

## 2014-07-05 NOTE — Progress Notes (Signed)
[facility] PROGRESS NOTE  Hannah Nielsen GUY:[id number] DOB: [birth year] DOA: 07/01/2014 PCP: [name], MD  Assessment/Plan: #1 acute CVA: Nondominant right high parietal cortical infarct embolic secondary to unknown source. Patient states left hand numbness clinically improving and close to baseline. MRI MRA of the head with right high parietal cortical infarct. 4.4 millimeter right A1/A2 aneurysm. Carotid Dopplers were done with no significant ICA stenosis. 2-D echo done with EF of 65-70%, no wall motion abnormalities, no cardiac source of emboli. Hemoglobin A1c is pending. Patient has been seen in consultation by neurology recommending a TEE to rule out embolic source. 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 Patient was seen on behalf of CT surgery by Dr. Harrington Challenger of cardiology and patient had MR cardiac yesterday 07/04/2014 which had a normal left ventricular size with moderate concentric hypertrophy EF of 62% with no wall motion abnormalities, normal right ventricular size and systolic function, normal by HO size, trivial mitral regurgitation, small mobile density measuring 13 x 8 mm attached to the lateral wall of the left H and located between left upper and left lower pulmonary veins. 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 Per neurology patient should be okay for surgery in 1-2 weeks. Per cardiology no need for loop recorder at this time. Per cardiology Dr. Roxy Manns of CT surgery aware of cath and MRI findings and plan is to offer the next week probably Thursday. Continue full dose aspirin for secondary stroke prevention. Risk factor modification. PT/OT. Neurology following.  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 Could be likely etiology of patient's stroke. Patient was seen by Dr. Harrington Challenger per CT surgery regarding left atrial mass. Patient had  cardiac MRI this morning with preliminary findings per cardiology of a small mass along the lateral wall of the left atrium concern for possible fibroblastoma with final interpretation/evaluation pending. Per neurology patient is okay for surgery from a neuro standpoint in 1 weeks. Patient to be continued on aspirin. Patient s/p cardiac catheterization 07/04/14 which showed severe two-vessel coronary artery disease. Patient to be seen by CT surgery who will decide as to when patient is to have surgery this coming week. Patient is low anxious and wanting to go home and return back next week for probable surgery.   #3 severe two-vessel coronary artery disease Per preoperative cardiac catheterization 07/04/2014. Patient noted to have severe two-vessel coronary artery disease including the ostial LAD and mid RCA. Per cardiology at time of surgery diffuse patient will benefit from a LIMA to the LAD and recommending keeping patient in the hospital until his surgery. Patient however is anxious and wanting to go home and come back for her surgery. Will defer this decision to cardiology.   #4 right ACA aneurysm Noted on MRA of the head which was 4.4 mm. Patient currently asymptomatic. Risk of rupture less than 1%.. Patient with family history of ruptured aneurysms in the past. Risk factor modification. Blood pressure management. Outpatient follow-up with neurosurgery for further evaluation and recommendations.  #5 hypertension Stable. Continue home regimen of Norvasc,  lisinopril and HCTZ. Goal blood pressure systolic < 563.  #6 hyperlipidemia LDL of 112. Goal LDL less than 70. Patient has been started on low-dose statin. Outpatient follow-up.  #7 tobacco abuse Tobacco cessation. Nicotine gum.  #8 abnormal chest x-ray Outpatient CT chest for further evaluation. Patient currently asymptomatic.  #9 Pre diabetes Diet and lifestyle modification  #  10 prophylaxis Lovenox for DVT prophylaxis.    Code  Status: Full Family Communication: Updated patient, no family at bedside. Disposition Plan: Remain inpatient.   Consultants:  Neurology: Dr. Nicole Kindred 07/01/2014  Cardiology: Dr  Marlou Porch 07/02/14, Dr Harrington Challenger 07/03/14  Procedures:  2-D echo 07/02/2014  Carotid Dopplers 07/02/2014  Chest x-ray 07/01/2014  MRI/MRA head 07/01/2014   MR Cardiac 07/04/14  Cardiac catheterization Dr. Scarlette Calico 07/04/14--- severe 2 vessel coronary artery disease including ostial LAD and mid RCA.  Antibiotics:  None  HPI/Subjective: Patient states numbness and tingling has improved significantly and close to her baseline. No CP, No SOB. No complaints.Patient asking whether she could go home and come back next week for eval per CT surgeon and probable cardiac surgery.  Objective: Filed Vitals:   07/05/14 0354  BP: 109/50  Pulse: 77  Temp: 98.1 F (36.7 C)  Resp: 18    Intake/Output Summary (Last 24 hours) at 07/05/14 1018 Last data filed at 07/05/14 0000  Gross per 24 hour  Intake  659.5 ml  Output      0 ml  Net  659.5 ml   Filed Weights   07/01/14 0733 07/05/14 0354  Weight: 83.915 kg (185 lb) 84.233 kg (185 lb 11.2 oz)    Exam:   General:  NAD  Cardiovascular: RRR  Respiratory: CTAB  Abdomen: Soft/NT/ND/+BS  Musculoskeletal: No c/c/e  Data Reviewed: Basic Metabolic Panel:  Recent Labs Lab 07/01/14 0810 07/02/14 1136 07/03/14 0720 07/04/14 0550 07/05/14 0252  NA 137 140 140 139 139  K 4.3 4.1 3.9 3.8 3.7  CL 101 107 107 104 105  CO2 27 23 25 27 26   GLUCOSE 99 75 104* 108* 102*  BUN 15 11 12 11 14   CREATININE 0.73 0.58 0.56 0.63 0.61  CALCIUM 9.2 8.9 9.1 9.1 9.3   Liver Function Tests:  Recent Labs Lab 07/01/14 0810 07/05/14 0252  AST 25 18  ALT 19 22  ALKPHOS 77 75  BILITOT 0.8 0.9  PROT 6.4 6.7  ALBUMIN 3.6 3.5   No results for input(s): LIPASE, AMYLASE in the last 168 hours. No results for input(s): AMMONIA in the last 168 hours. CBC:  Recent  Labs Lab 07/01/14 0810 07/02/14 1136 07/03/14 0720 07/04/14 0550 07/05/14 0252  WBC 10.3 9.3 10.0 9.5 11.7*  NEUTROABS 7.0  --   --   --   --   HGB 15.1* 14.5 15.8* 15.2* 15.6*  HCT 43.4 43.8 47.0* 44.7 45.8  MCV 91.9 93.6 92.9 91.6 91.6  PLT 321 309 306 313 311   Cardiac Enzymes:  Recent Labs Lab 07/01/14 0810  TROPONINI <0.03   BNP (last 3 results) No results for input(s): BNP in the last 8760 hours.  ProBNP (last 3 results) No results for input(s): PROBNP in the last 8760 hours.  CBG:  Recent Labs Lab 07/01/14 2225 07/02/14 0645  GLUCAP 178* 106*    No results found for this or any previous visit (from the past 240 hour(s)).   Studies: Mr Card Morphology Wo/w Cm  07/04/2014   CLINICAL DATA:  64 year old female with stroke and left atrial mass on TEE.  EXAM: CARDIAC MRI  TECHNIQUE: The patient was scanned on a 1.5 Tesla GE magnet. A dedicated cardiac coil was used. Functional imaging was done using Fiesta sequences. 2,3, and 4 chamber views were done to assess for RWMA's. Modified Simpson's rule using a short axis stack was used to calculate an ejection fraction on a dedicated work station using  Circle software. The patient received 27 cc of Multihance. After 10 minutes inversion recovery sequences were used to assess for infiltration and scar tissue.  CONTRAST:  27 cc  of Multihance  FINDINGS: 1.Normal left ventricular size with moderate concentric hypertrophy and normal systolic function (LVEF = 62%). No regional wall motion abnormalities.  The measurements are as follows:  LVEDD:  43 mm  LVESD:  26 mm  LVEDV:  100 ml  LVESV:  38 ml  SV:  62 ml  CO:  4.7 L/minute  Myocardial mass:  137 g  2. Normal right ventricular size, thickness and systolic function (RVEF= 14%).  RVEDV:  109 ml  RVESV:  56 ml  SV:  54 ml  3.  Normal biatrial size.  4.  Trivial mitral regurgitation.  5. There is a small mobile density measuring 13 x 8 mm attached to the lateral wall of the left atrium.  The mass is located between left upper and left lower pulmonary veins. There is no obvious penetration into the wall. There is no other mass seen anywhere else in the heart.  IMPRESSION: 1. Normal left ventricular size with moderate concentric hypertrophy and normal systolic function (LVEF = 62%). No regional wall motion abnormalities.  2. Normal right ventricular size, thickness and systolic function (RVEF= 97%).  3.  Normal biatrial size.  4.  Trivial mitral regurgitation.  5. There is a small mobile density measuring 13 x 8 mm attached to the lateral wall of the left atrium located between left upper and left lower pulmonary veins. There is no obvious penetration into the wall. There is no other mass seen anywhere else in the heart. 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  Unfortunately, the size of the mass and mobile character didn't allow further tissue characterization.  Ena Dawley   Electronically Signed   By: Ena Dawley   On: 07/04/2014 14:13    Scheduled Meds: .  stroke: mapping our early stages of recovery book   Does not apply Once  . amLODipine  2.5 mg Oral QHS  . aspirin  300 mg Rectal Daily   Or  . aspirin  325 mg Oral Daily  . atorvastatin  20 mg Oral q1800  . enoxaparin (LOVENOX) injection  40 mg Subcutaneous Q24H  . hydrochlorothiazide  12.5 mg Oral Daily  . lisinopril  10 mg Oral QHS  . loratadine  10 mg Oral Daily  . oxybutynin  10 mg Oral QHS  . 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 [facility] Pager [phone]. If 7PM-7AM, please contact night-coverage at [email or web address], password [facility] 07/05/2014, 10:18 AM  LOS: 4 days

## 2014-07-06 LAB — BASIC METABOLIC PANEL
Anion gap: 6 (ref 5–15)
BUN: 19 mg/dL (ref 6–23)
CALCIUM: 9.3 mg/dL (ref 8.4–10.5)
CO2: 29 mmol/L (ref 19–32)
Chloride: 102 mmol/L (ref 96–112)
Creatinine, Ser: 0.68 mg/dL (ref 0.50–1.10)
GFR calc Af Amer: >90 mL/min (ref >90)
Glucose, Bld: 113 mg/dL — ABNORMAL HIGH (ref 70–99)
POTASSIUM: 3.7 mmol/L (ref 3.5–5.1)
SODIUM: 137 mmol/L (ref 135–145)

## 2014-07-06 LAB — CBC
HCT: 42.9 % (ref 36.0–46.0)
Hemoglobin: 14.6 g/dL (ref 12.0–15.0)
MCH: 31.3 pg (ref 26.0–34.0)
MCHC: 34 g/dL (ref 30.0–36.0)
MCV: 91.9 fL (ref 78.0–100.0)
PLATELETS: 297 10*3/uL (ref 150–400)
RBC: 4.67 MIL/uL (ref 3.87–5.11)
RDW: 13.2 % (ref 11.5–15.5)
WBC: 9.7 10*3/uL (ref 4.0–10.5)

## 2014-07-06 MED ORDER — ASPIRIN 81 MG PO CHEW: 81.0000 mg | CHEWABLE_TABLET | Freq: Every day | ORAL | Status: DC

## 2014-07-06 MED ORDER — ASPIRIN 81 MG PO CHEW
324.0000 mg | CHEWABLE_TABLET | Freq: Every day | ORAL | Status: DC
  Administered 2014-07-07 – 2014-07-09 (×3): 324 mg via ORAL
  Filled 2014-07-06 (×5): qty 4

## 2014-07-06 MED ORDER — ATORVASTATIN CALCIUM 80 MG PO TABS
80.0000 mg | ORAL_TABLET | Freq: Every day | ORAL | Status: DC
  Administered 2014-07-06 – 2014-07-09 (×4): 80 mg via ORAL
  Filled 2014-07-06 (×6): qty 1

## 2014-07-06 NOTE — Progress Notes (Signed)
Patient Name: Hannah Nielsen      SUBJECTIVE: admitted with stroke and found on TEE to have atrial mass suggestive of fibroelastoma. Cath also demonstrated severe 2 V disease and she is to be seen Tues by Dr Roxy Manns and surgery on Thursday  No cp or sob  Past Medical History  Diagnosis Date  . Hypertension   . 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 CAD (coronary artery disease), native coronary artery: Severe 2 vessel dz per cardiac cath 07/04/14 07/04/2014    Scheduled Meds:  Scheduled Meds: . amLODipine  2.5 mg Oral QHS  . aspirin  300 mg Rectal Daily   Or  . aspirin  325 mg Oral Daily  . atorvastatin  80 mg Oral q1800  . enoxaparin (LOVENOX) injection  40 mg Subcutaneous Q24H  . lisinopril  10 mg Oral QHS  . loratadine  10 mg Oral Daily  . oxybutynin  10 mg Oral QHS  . senna-docusate  1 tablet Oral QHS   Continuous Infusions:  acetaminophen, albuterol, hydrALAZINE, menthol-cetylpyridinium, nicotine polacrilex, ondansetron (ZOFRAN) IV    PHYSICAL EXAM Filed Vitals:   07/05/14 1000 07/05/14 1310 07/05/14 2100 07/06/14 0543  BP:  117/69 114/66 108/63  Pulse:  87  73  Temp:  97.8 F (36.6 C)  98.2 F (36.8 C)  TempSrc:  Oral  Oral  Resp:  20 20 20   Height: 5' 2.5" (1.588 m)     Weight:    189 lb (85.73 kg)  SpO2:  98% 95% 96%    Well developed and nourished in no acute distress HENT normal Neck supple with JVP-flat Clear Regular rate and rhythm, no murmurs or gallops Abd-soft with active BS No Clubbing cyanosis edema Skin-warm and dry A & Oriented  Grossly normal sensory and motor function   TELEMETRY: Reviewed telemetry pt in nsr:    Intake/Output Summary (Last 24 hours) at 07/06/14 1157 Last data filed at 07/06/14 0811  Gross per 24 hour  Intake    840 ml  Output      0 ml  Net    840 ml    LABS: Basic Metabolic Panel:  Recent Labs Lab 07/01/14 0810 07/02/14 1136 07/03/14 0720 07/04/14 0550  07/05/14 0252 07/06/14 0350  NA 137 140 140 139 139 137  K 4.3 4.1 3.9 3.8 3.7 3.7  CL 101 107 107 104 105 102  CO2 27 23 25 27 26 29   GLUCOSE 99 75 104* 108* 102* 113*  BUN 15 11 12 11 14 19   CREATININE 0.73 0.58 0.56 0.63 0.61 0.68  CALCIUM 9.2 8.9 9.1 9.1 9.3 9.3   Cardiac Enzymes: No results for input(s): CKTOTAL, CKMB, CKMBINDEX, TROPONINI in the last 72 hours. CBC:  Recent Labs Lab 07/01/14 0810 07/02/14 1136 07/03/14 0720 07/04/14 0550 07/05/14 0252 07/06/14 0350  WBC 10.3 9.3 10.0 9.5 11.7* 9.7  NEUTROABS 7.0  --   --   --   --   --   HGB 15.1* 14.5 15.8* 15.2* 15.6* 14.6  HCT 43.4 43.8 47.0* 44.7 45.8 42.9  MCV 91.9 93.6 92.9 91.6 91.6 91.9  PLT 321 309 306 313 311 297   PROTIME: No results for input(s): LABPROT, INR in the last 72 hours. Liver Function Tests:  Recent Labs  07/05/14 0252  AST 18  ALT 22  ALKPHOS 75  BILITOT 0.9  PROT 6.7  ALBUMIN 3.5   No results for input(s): LIPASE, AMYLASE  in the last 72 hours. BNP: BNP (last 3 results) No results for input(s): BNP in the last 8760 hours.  ProBNP (last 3 results) No results for input(s): PROBNP in the last 8760 hours.  D-Dimer: No results for input(s): DDIMER in the last 72 hours. Hemoglobin A1C: No results for input(s): HGBA1C in the last 72 hours. Fasting Lipid Panel: No results for input(s): CHOL, HDL, LDLCALC, TRIG, CHOLHDL, LDLDIRECT in the last 72 hours. Thyroid Function Tests: No results for input(s): TSH, T4TOTAL, T3FREE, THYROIDAB in the last 72 hours.  Invalid input(s): FREET3 Anemia Panel: No results for input(s): VITAMINB12, FOLATE, FERRITIN, TIBC, IRON, RETICCTPCT in the last 72 hours.      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

## 2014-07-06 NOTE — Consult Note (Deleted)
Note entered in error

## 2014-07-06 NOTE — Progress Notes (Signed)
Patient: Hannah Nielsen / Admit Date: 07/01/2014 / Date of Encounter: 07/06/2014, 11:44 AM   Subjective: Feeling great. No CP or SOB. No angina prior to admission. Mild dyspnea when rushing up the steps. Loves smoking, but she is surprised she has tolerated going without a cigarette better than she thought she would.  Objective: Telemetry: NSR Physical Exam: Blood pressure 108/63, pulse 73, temperature 98.2 F (36.8 C), temperature source Oral, resp. rate 20, height 5' 2.5" (1.588 m), weight 189 lb (85.73 kg), SpO2 96 %. General: Well developed, well nourished WF in no acute distress. Head: Normocephalic, atraumatic, sclera non-icteric, no xanthomas, nares are without discharge. Neck: JVP not elevated. Lungs: Clear bilaterally to auscultation without wheezes, rales, or rhonchi. Breathing is unlabored. Heart: RRR S1 S2 without murmurs, rubs, or gallops.  Abdomen: Soft, non-tender, non-distended with normoactive bowel sounds. No rebound/guarding. Extremities: No clubbing or cyanosis. No edema. Distal pedal pulses are 2+ and equal bilaterally. Right wrist site without hematoma/ecchymosis. Right groin ecchymosis but soft, no bruit or hematoma. Neuro: Alert and oriented X 3. Moves all extremities spontaneously. Psych:  Responds to questions appropriately with a normal affect.   Intake/Output Summary (Last 24 hours) at 07/06/14 1144 Last data filed at 07/06/14 0811  Gross per 24 hour  Intake    840 ml  Output      0 ml  Net    840 ml    Inpatient Medications:  . amLODipine  2.5 mg Oral QHS  . aspirin  300 mg Rectal Daily   Or  . aspirin  325 mg Oral Daily  . atorvastatin  80 mg Oral q1800  . enoxaparin (LOVENOX) injection  40 mg Subcutaneous Q24H  . lisinopril  10 mg Oral QHS  . loratadine  10 mg Oral Daily  . oxybutynin  10 mg Oral QHS  . senna-docusate  1 tablet Oral QHS   Infusions:    Labs:  Recent Labs  07/05/14 0252 07/06/14 0350  NA 139 137  K 3.7 3.7  CL 105 102    CO2 26 29  GLUCOSE 102* 113*  BUN 14 19  CREATININE 0.61 0.68  CALCIUM 9.3 9.3    Recent Labs  07/05/14 0252  AST 18  ALT 22  ALKPHOS 75  BILITOT 0.9  PROT 6.7  ALBUMIN 3.5    Recent Labs  07/05/14 0252 07/06/14 0350  WBC 11.7* 9.7  HGB 15.6* 14.6  HCT 45.8 42.9  MCV 91.6 91.9  PLT 311 297   No results for input(s): CKTOTAL, CKMB, TROPONINI in the last 72 hours. Invalid input(s): POCBNP No results for input(s): HGBA1C in the last 72 hours.   Radiology/Studies:  Dg Chest 2 View  07/01/2014   CLINICAL DATA:  Stroke. Left hand numbness. cough and congestion. Hypertension.  EXAM: CHEST  2 VIEW  COMPARISON:  None.  FINDINGS: Bandlike density in the left lower lobe and right middle lobe. Vague nodularity along the left basilar opacity. Atherosclerotic calcification of the aortic arch. Calcifications along the left hilum likely reflecting old granulomatous disease. Thoracic spondylosis is present.  IMPRESSION: 1. Bandlike opacities in the right middle lobe and left lower lobe, with some nodularity along the left lower lobe opacity. Given the patient's smoking history and lack of prior chest imaging to establish stability, I do recommend nonurgent chest CT followup to further characterize these opacities and exclude underlying malignancy. 2. Atherosclerosis.   Electronically Signed   By: Van Clines M.D.   On: 07/01/2014 15:01  Mr [name] Wo Contrast  07/01/2014   CLINICAL DATA:  64 year old hypertensive female with history of smoking presenting with left hand numbness and clumsiness. Small acute infarct right precentral cortex noted on recent MR. Subsequent encounter.  EXAM: MRA HEAD WITHOUT CONTRAST  TECHNIQUE: Angiographic images of the Circle of Willis were obtained using MRA technique without intravenous contrast.  COMPARISON:  07/01/2014 brain MR.  FINDINGS: Motion degradation contributes to appearance of branch vessel irregularity.  Mild narrowing right internal carotid  artery cavernous/supraclinoid segment junction.  4.4 mm aneurysm suspected arising at the junction of the A1/ A2 segment of the right anterior cerebral artery and directed inferiorly.  No high-grade stenosis of either carotid terminus or M1 segment of the middle cerebral artery.  Middle cerebral artery and A2 segment anterior cerebral artery branch vessel irregularity bilaterally may be related to atherosclerotic disease and/or result of motion artifact.  No significant stenosis of either distal vertebral artery.  Left posterior inferior cerebellar artery and right anterior inferior cerebellar artery not visualized.  No significant stenosis of the basilar artery.  Mild irregularity of the superior cerebellar artery and distal branches of the posterior cerebral artery bilaterally  IMPRESSION: 4.4 mm aneurysm suspected arising at the junction of the A1/ A2 segment of the right anterior cerebral artery and directed inferiorly.  Mild narrowing right internal carotid artery cavernous/supraclinoid segment junction.  No high-grade stenosis of either carotid terminus or M1 segment of the middle cerebral artery.  Branch vessel irregularity as detailed above.  These results will be called to the ordering clinician or representative by the Radiologist Assistant, and communication documented in the PACS or zVision Dashboard.   Electronically Signed   By: Genia Del M.D.   On: 07/01/2014 16:39   Mr Brain Wo Contrast  07/01/2014   CLINICAL DATA:  Left arm weakness and tingling began 2 a.m. today.  EXAM: MRI HEAD WITHOUT CONTRAST  TECHNIQUE: Multiplanar, multiecho pulse sequences of the brain and surrounding structures were obtained without intravenous contrast.  COMPARISON:  None.  FINDINGS: Small area of acute infarct in the right precentral cortex over the convexity. This appears to be in the precentral cortex with scattered small areas of patchy restricted diffusion. No other acute infarct.  Ventricle size normal.   Cerebral volume normal.  Negative for intracranial hemorrhage.  Negative for mass or edema.  Paranasal sinuses are clear.  Enlargement of the sella which is filled with CSF. Possible arachnoid cyst versus empty sella. Pituitary normal in size.  IMPRESSION: Small area of acute infarct right precentral cortex over the convexity. Negative for hemorrhage.   Electronically Signed   By: Franchot Gallo M.D.   On: 07/01/2014 10:00   Mr Card Morphology Wo/w Cm  07/04/2014   CLINICAL DATA:  64 year old female with stroke and left atrial mass on TEE.  EXAM: CARDIAC MRI  TECHNIQUE: The patient was scanned on a 1.5 Tesla GE magnet. A dedicated cardiac coil was used. Functional imaging was done using Fiesta sequences. 2,3, and 4 chamber views were done to assess for RWMA's. Modified Simpson's rule using a short axis stack was used to calculate an ejection fraction on a dedicated work Conservation officer, nature. The patient received 27 cc of Multihance. After 10 minutes inversion recovery sequences were used to assess for infiltration and scar tissue.  CONTRAST:  27 cc  of Multihance  FINDINGS: 1.Normal left ventricular size with moderate concentric hypertrophy and normal systolic function (LVEF = 62%). No regional wall motion abnormalities.  The measurements are as follows:  LVEDD:  43 mm  LVESD:  26 mm  LVEDV:  100 ml  LVESV:  38 ml  SV:  62 ml  CO:  4.7 L/minute  Myocardial mass:  137 g  2. Normal right ventricular size, thickness and systolic function (RVEF= 03%).  RVEDV:  109 ml  RVESV:  56 ml  SV:  54 ml  3.  Normal biatrial size.  4.  Trivial mitral regurgitation.  5. There is a small mobile density measuring 13 x 8 mm attached to the lateral wall of the left atrium. The mass is located between left upper and left lower pulmonary veins. There is no obvious penetration into the wall. There is no other mass seen anywhere else in the heart.  IMPRESSION: 1. Normal left ventricular size with moderate concentric hypertrophy  and normal systolic function (LVEF = 62%). No regional wall motion abnormalities.  2. Normal right ventricular size, thickness and systolic function (RVEF= 21%).  3.  Normal biatrial size.  4.  Trivial mitral regurgitation.  5. There is a small mobile density measuring 13 x 8 mm attached to the lateral wall of the left atrium located between left upper and left lower pulmonary veins. There is no obvious penetration into the wall. There is no other mass seen anywhere else in the heart. 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  Unfortunately, the size of the mass and mobile character didn't allow further tissue characterization.  Ena Dawley   Electronically Signed   By: Ena Dawley   On: 07/04/2014 14:13     Assessment and Plan  33F with HTN admitted with left hand numbness/clumsiness, found to have right parietal cortical infarct felt to be embolic. 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 Cath with 2V CAD.  1. Acute stroke - carotid duplex 07/02/14: no significant extracranial stenoses. LICA tortuous, mildly elevating velocities, BECAs c/w severe stenosis.  - Cardiac workup - 2D Echo 07/02/14: EF 65-70%, grade 1 DD.  TEE 07/03/14 - EF 60-65%, left atrial mass, small PFO. Cardiac MRI could not further tissue characterization, but LA mass felt possibly the cause of her stroke - will discuss with MD whether anticoagulation is warranted in this situation - neurology did not make recommendation beyond ASA (also note patient has R ACA aneurysm)  2. Left atrial mass and subsequent cath showing severe 2-vessel CAD including ostial LAD and mid RCA - CVTS aware of findings per Dr. Alan Ripper note - should be OK for surgery in 1-2 weeks per neurology note - Dr. Irish Lack recommended keeping her in the hospital until her surgery - titrate statin given cardiovascular and cerebrovascular disease - consider changing CCB to BB - follow groin  ecchymosis  4. Small PFO by TEE - does she need LE duplex to exclude DVT given stroke?  5. Right ACA aneurysm, 4.4cm - risk of rupture <1% per year - family history of ruptured aneurysms - neuro input reveals not a candidate for treatment at this time given current stroke, will need OP neurosurgery consult  6. Abnormal CXR - "bandlike opacities in the right middle lobe and left lower lobe, with some nodularity along the left lower lobe opacity" - radiology recommending nonemergent chest CT to characterize and exclude underlying malignancy given tobacco history - will discuss with cardiology MD whether this is something that should be evaluated prior to possible surgery  6. HTN 7. Hyperlipidemia LDL 112 8. Tobacco abuse,  cessation advised 9. Hyperglycemia A1C 6.3   Signed, Dayna Dunn PA-C

## 2014-07-06 NOTE — Progress Notes (Signed)
[facility] PROGRESS NOTE  Hannah Nielsen FYB:[id number] DOB: 05-26-1950 DOA: 07/01/2014 PCP: [name], MD  Assessment/Plan: #1 acute CVA: Nondominant right high parietal cortical infarct embolic secondary to unknown source. Patient states left hand numbness clinically improving and close to baseline. MRI MRA of the head with right high parietal cortical infarct. 4.4 millimeter right A1/A2 aneurysm. Carotid Dopplers were done with no significant ICA stenosis. 2-D echo done with EF of 65-70%, no wall motion abnormalities, no cardiac source of emboli. Hemoglobin A1c is pending. Patient has been seen in consultation by neurology recommending a TEE to rule out embolic source. 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 Patient was seen on behalf of CT surgery by Dr. Harrington Challenger of cardiology and patient had MR cardiac yesterday 07/04/2014 which had a normal left ventricular size with moderate concentric hypertrophy EF of 62% with no wall motion abnormalities, normal right ventricular size and systolic function, normal by HO size, trivial mitral regurgitation, small mobile density measuring 13 x 8 mm attached to the lateral wall of the left H and located between left upper and left lower pulmonary veins. 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 Per neurology patient should be okay for surgery in 1-2 weeks. Per cardiology no need for loop recorder at this time. Per cardiology Dr. Roxy Manns of CT surgery aware of cath and MRI findings and plan is to operate next week probably Thursday. Continue full dose aspirin for secondary stroke prevention. Risk factor modification. PT/OT.   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 Could be likely etiology of patient's stroke. Patient was seen by Dr. Harrington Challenger per CT surgery regarding left atrial mass. Patient had cardiac MRI this  morning with preliminary findings per cardiology of a small mass along the lateral wall of the left atrium concern for possible fibroblastoma with final interpretation/evaluation pending. Per neurology patient is okay for surgery from a neuro standpoint in 1 weeks. Patient to be continued on aspirin. Patient s/p cardiac catheterization 07/04/14 which showed severe two-vessel coronary artery disease. Patient to be seen by CT surgery who will decide as to when patient is to have surgery this coming week.  #3 severe two-vessel coronary artery disease Per preoperative cardiac catheterization 07/04/2014. Patient noted to have severe two-vessel coronary artery disease including the ostial LAD and mid RCA. Per cardiology at time of surgery, patient will benefit from a LIMA to the LAD and recommending keeping patient in the hospital until his surgery. CT surgery to assess.   #4 right ACA aneurysm Noted on MRA of the head which was 4.4 mm. Patient currently asymptomatic. Risk of rupture less than 1%.. Patient with family history of ruptured aneurysms in the past. Risk factor modification. Blood pressure management. Outpatient follow-up with neurosurgery, Dr [name], for further evaluation and recommendations.  #5 hypertension Stable. Continue home regimen of Norvasc,  lisinopril and HCTZ. Goal blood pressure systolic < 527.  #6 hyperlipidemia LDL of 112. Goal LDL less than 70. Patient has been started on low-dose statin. Outpatient follow-up.  #7 tobacco abuse Tobacco cessation. Nicotine gum.  #8 abnormal chest x-ray Outpatient CT chest for further evaluation. Patient currently asymptomatic.  #9 Pre diabetes Diet and lifestyle modification  #10 prophylaxis Lovenox for DVT prophylaxis.    Code Status: Full Family Communication: Updated patient, no family at bedside. Disposition Plan: Remain inpatient.   Consultants:  Neurology: Dr. Nicole Kindred 07/01/2014  Cardiology: Dr  Skains 07/02/14, Dr  Harrington Challenger 07/03/14  Procedures:  2-D echo 07/02/2014  Carotid Dopplers 07/02/2014  Chest x-ray 07/01/2014  MRI/MRA head 07/01/2014   MR Cardiac 07/04/14  Cardiac catheterization Dr. Scarlette Calico 07/04/14--- severe 2 vessel coronary artery disease including ostial LAD and mid RCA.  Antibiotics:  None  HPI/Subjective: Patient states numbness and tingling has improved significantly and close to her baseline. No CP, No SOB. No complaints.  Objective: Filed Vitals:   07/06/14 0543  BP: 108/63  Pulse: 73  Temp: 98.2 F (36.8 C)  Resp: 20    Intake/Output Summary (Last 24 hours) at 07/06/14 0959 Last data filed at 07/06/14 1245  Gross per 24 hour  Intake    840 ml  Output      0 ml  Net    840 ml   Filed Weights   07/01/14 0733 07/05/14 0354 07/06/14 0543  Weight: 83.915 kg (185 lb) 84.233 kg (185 lb 11.2 oz) 85.73 kg (189 lb)    Exam:   General:  NAD  Cardiovascular: RRR  Respiratory: CTAB  Abdomen: Soft/NT/ND/+BS  Musculoskeletal: No c/c/e  Data Reviewed: Basic Metabolic Panel:  Recent Labs Lab 07/02/14 1136 07/03/14 0720 07/04/14 0550 07/05/14 0252 07/06/14 0350  NA 140 140 139 139 137  K 4.1 3.9 3.8 3.7 3.7  CL 107 107 104 105 102  CO2 23 25 27 26 29   GLUCOSE 75 104* 108* 102* 113*  BUN 11 12 11 14 19   CREATININE 0.58 0.56 0.63 0.61 0.68  CALCIUM 8.9 9.1 9.1 9.3 9.3   Liver Function Tests:  Recent Labs Lab 07/01/14 0810 07/05/14 0252  AST 25 18  ALT 19 22  ALKPHOS 77 75  BILITOT 0.8 0.9  PROT 6.4 6.7  ALBUMIN 3.6 3.5   No results for input(s): LIPASE, AMYLASE in the last 168 hours. No results for input(s): AMMONIA in the last 168 hours. CBC:  Recent Labs Lab 07/01/14 0810 07/02/14 1136 07/03/14 0720 07/04/14 0550 07/05/14 0252 07/06/14 0350  WBC 10.3 9.3 10.0 9.5 11.7* 9.7  NEUTROABS 7.0  --   --   --   --   --   HGB 15.1* 14.5 15.8* 15.2* 15.6* 14.6  HCT 43.4 43.8 47.0* 44.7 45.8 42.9  MCV 91.9 93.6 92.9 91.6 91.6 91.9  PLT  321 309 306 313 311 297   Cardiac Enzymes:  Recent Labs Lab 07/01/14 0810  TROPONINI <0.03   BNP (last 3 results) No results for input(s): BNP in the last 8760 hours.  ProBNP (last 3 results) No results for input(s): PROBNP in the last 8760 hours.  CBG:  Recent Labs Lab 07/01/14 2225 07/02/14 0645  GLUCAP 178* 106*    No results found for this or any previous visit (from the past 240 hour(s)).   Studies: Mr Card Morphology Wo/w Cm  07/04/2014   CLINICAL DATA:  64 year old female with stroke and left atrial mass on TEE.  EXAM: CARDIAC MRI  TECHNIQUE: The patient was scanned on a 1.5 Tesla GE magnet. A dedicated cardiac coil was used. Functional imaging was done using Fiesta sequences. 2,3, and 4 chamber views were done to assess for RWMA's. Modified Simpson's rule using a short axis stack was used to calculate an ejection fraction on a dedicated work Conservation officer, nature. The patient received 27 cc of Multihance. After 10 minutes inversion recovery sequences were used to assess for infiltration and scar tissue.  CONTRAST:  27 cc  of Multihance  FINDINGS: 1.Normal left  ventricular size with moderate concentric hypertrophy and normal systolic function (LVEF = 62%). No regional wall motion abnormalities.  The measurements are as follows:  LVEDD:  43 mm  LVESD:  26 mm  LVEDV:  100 ml  LVESV:  38 ml  SV:  62 ml  CO:  4.7 L/minute  Myocardial mass:  137 g  2. Normal right ventricular size, thickness and systolic function (RVEF= 20%).  RVEDV:  109 ml  RVESV:  56 ml  SV:  54 ml  3.  Normal biatrial size.  4.  Trivial mitral regurgitation.  5. There is a small mobile density measuring 13 x 8 mm attached to the lateral wall of the left atrium. The mass is located between left upper and left lower pulmonary veins. There is no obvious penetration into the wall. There is no other mass seen anywhere else in the heart.  IMPRESSION: 1. Normal left ventricular size with moderate concentric  hypertrophy and normal systolic function (LVEF = 62%). No regional wall motion abnormalities.  2. Normal right ventricular size, thickness and systolic function (RVEF= 25%).  3.  Normal biatrial size.  4.  Trivial mitral regurgitation.  5. There is a small mobile density measuring 13 x 8 mm attached to the lateral wall of the left atrium located between left upper and left lower pulmonary veins. There is no obvious penetration into the wall. There is no other mass seen anywhere else in the heart. 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  Unfortunately, the size of the mass and mobile character didn't allow further tissue characterization.  Hannah Nielsen   Electronically Signed   By: Hannah Nielsen   On: 07/04/2014 14:13    Scheduled Meds: . amLODipine  2.5 mg Oral QHS  . aspirin  300 mg Rectal Daily   Or  . aspirin  325 mg Oral Daily  . atorvastatin  20 mg Oral q1800  . enoxaparin (LOVENOX) injection  40 mg Subcutaneous Q24H  . lisinopril  10 mg Oral QHS  . loratadine  10 mg Oral Daily  . oxybutynin  10 mg Oral QHS  . 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 [facility] Pager [phone]. If 7PM-7AM, please contact night-coverage at [email or web address], password [facility] 07/06/2014, 9:59 AM  LOS: 5 days

## 2014-07-07 ENCOUNTER — Inpatient Hospital Stay (HOSPITAL_COMMUNITY): Payer: BC Managed Care – PPO

## 2014-07-07 ENCOUNTER — Encounter (HOSPITAL_COMMUNITY): Payer: Self-pay | Admitting: Cardiology

## 2014-07-07 ENCOUNTER — Other Ambulatory Visit: Payer: Self-pay

## 2014-07-07 ENCOUNTER — Other Ambulatory Visit: Payer: Self-pay | Admitting: *Deleted

## 2014-07-07 DIAGNOSIS — D219 Benign neoplasm of connective and other soft tissue, unspecified: Secondary | ICD-10-CM

## 2014-07-07 DIAGNOSIS — I2510 Atherosclerotic heart disease of native coronary artery without angina pectoris: Secondary | ICD-10-CM

## 2014-07-07 LAB — PULMONARY FUNCTION TEST
DL/VA % pred: 103 %
DL/VA: 4.75 ml/min/mmHg/L
DLCO cor % pred: 110 %
DLCO cor: 24.46 ml/min/mmHg
DLCO unc % pred: 112 %
DLCO unc: 24.98 ml/min/mmHg
FEF 25-75 Post: 1.08 L/sec
FEF 25-75 Pre: 1.39 L/sec
FEF2575-%Change-Post: -21 %
FEF2575-%Pred-Post: 52 %
FEF2575-%Pred-Pre: 67 %
FEV1-%Change-Post: -7 %
FEV1-%Pred-Post: 82 %
FEV1-%Pred-Pre: 88 %
FEV1-Post: 1.88 L
FEV1-Pre: 2.03 L
FEV1FVC-%Change-Post: -7 %
FEV1FVC-%Pred-Pre: 93 %
FEV6-%Change-Post: 0 %
FEV6-%Pred-Post: 95 %
FEV6-%Pred-Pre: 95 %
FEV6-Post: 2.73 L
FEV6-Pre: 2.75 L
FEV6FVC-%Change-Post: 0 %
FEV6FVC-%Pred-Post: 101 %
FEV6FVC-%Pred-Pre: 102 %
FVC-%Change-Post: 0 %
FVC-%Pred-Post: 93 %
FVC-%Pred-Pre: 94 %
FVC-Post: 2.8 L
FVC-Pre: 2.8 L
Post FEV1/FVC ratio: 67 %
Post FEV6/FVC ratio: 98 %
Pre FEV1/FVC ratio: 72 %
Pre FEV6/FVC Ratio: 98 %
RV % pred: 151 %
RV: 3.04 L
TLC % pred: 127 %
TLC: 6.16 L

## 2014-07-07 LAB — URINALYSIS, ROUTINE W REFLEX MICROSCOPIC
BILIRUBIN URINE: NEGATIVE
Glucose, UA: 500 mg/dL — AB
HGB URINE DIPSTICK: NEGATIVE
Ketones, ur: NEGATIVE mg/dL
Leukocytes, UA: NEGATIVE
Nitrite: NEGATIVE
PROTEIN: NEGATIVE mg/dL
Specific Gravity, Urine: 1.016 (ref 1.005–1.030)
UROBILINOGEN UA: 0.2 mg/dL (ref 0.0–1.0)
pH: 6.5 (ref 5.0–8.0)

## 2014-07-07 LAB — BLOOD GAS, ARTERIAL
Acid-Base Excess: 2.5 mmol/L — ABNORMAL HIGH (ref 0.0–2.0)
BICARBONATE: 26.7 meq/L — AB (ref 20.0–24.0)
DRAWN BY: 365271
FIO2: 0.21 %
O2 SAT: 93.1 %
Patient temperature: 98.6
TCO2: 28 mmol/L (ref 0–100)
pCO2 arterial: 42.8 mmHg (ref 35.0–45.0)
pH, Arterial: 7.411 (ref 7.350–7.450)
pO2, Arterial: 72.4 mmHg — ABNORMAL LOW (ref 80.0–100.0)

## 2014-07-07 LAB — CBC
HCT: 42 % (ref 36.0–46.0)
Hemoglobin: 14.1 g/dL (ref 12.0–15.0)
MCH: 30.7 pg (ref 26.0–34.0)
MCHC: 33.6 g/dL (ref 30.0–36.0)
MCV: 91.5 fL (ref 78.0–100.0)
PLATELETS: 281 10*3/uL (ref 150–400)
RBC: 4.59 MIL/uL (ref 3.87–5.11)
RDW: 13 % (ref 11.5–15.5)
WBC: 9.7 10*3/uL (ref 4.0–10.5)

## 2014-07-07 LAB — BASIC METABOLIC PANEL
ANION GAP: 7 (ref 5–15)
BUN: 14 mg/dL (ref 6–23)
CALCIUM: 9.1 mg/dL (ref 8.4–10.5)
CO2: 28 mmol/L (ref 19–32)
Chloride: 104 mmol/L (ref 96–112)
Creatinine, Ser: 0.73 mg/dL (ref 0.50–1.10)
GFR calc non Af Amer: 88 mL/min — ABNORMAL LOW (ref >90)
Glucose, Bld: 107 mg/dL — ABNORMAL HIGH (ref 70–99)
Potassium: 3.9 mmol/L (ref 3.5–5.1)
SODIUM: 139 mmol/L (ref 135–145)

## 2014-07-07 MED ORDER — CARVEDILOL 3.125 MG PO TABS
3.1250 mg | ORAL_TABLET | Freq: Two times a day (BID) | ORAL | Status: DC
  Administered 2014-07-07 – 2014-07-09 (×6): 3.125 mg via ORAL
  Filled 2014-07-07 (×9): qty 1

## 2014-07-07 MED ORDER — LISINOPRIL 5 MG PO TABS
5.0000 mg | ORAL_TABLET | Freq: Every day | ORAL | Status: DC
  Administered 2014-07-07 – 2014-07-09 (×3): 5 mg via ORAL
  Filled 2014-07-07 (×4): qty 1

## 2014-07-07 NOTE — Progress Notes (Addendum)
Subjective: Feels great and has most of the time.   Objective: Vital signs in last 24 hours: Temp:  [97.8 F (36.6 C)-98.9 F (37.2 C)] 97.8 F (36.6 C) (03/28 0507) Pulse Rate:  [75-94] 75 (03/28 0507) Resp:  [18-20] 20 (03/28 0507) BP: (105-135)/(54-75) 105/54 mmHg (03/28 0507) SpO2:  [95 %-96 %] 95 % (03/28 0507) Weight:  [190 lb 3.2 oz (86.274 kg)] 190 lb 3.2 oz (86.274 kg) (03/28 0507) Last BM Date: 07/02/14  Intake/Output from previous day: 03/27 0701 - 03/28 0700 In: 960 [P.O.:960] Out: -  Intake/Output this shift:    Medications Current Facility-Administered Medications  Medication Dose Route Frequency Provider Last Rate Last Dose  . acetaminophen (TYLENOL) tablet 650 mg  650 mg Oral Q4H PRN Jettie Booze, MD      . albuterol (PROVENTIL) (2.5 MG/3ML) 0.083% nebulizer solution 2.5 mg  2.5 mg Nebulization Q2H PRN Jonetta Osgood, MD      . aspirin chewable tablet 324 mg  324 mg Oral Daily Deboraha Sprang, MD   324 mg at 07/07/14 0857  . atorvastatin (LIPITOR) tablet 80 mg  80 mg Oral q1800 Dayna N Dunn, PA-C   80 mg at 07/06/14 1705  . carvedilol (COREG) tablet 3.125 mg  3.125 mg Oral BID WC Eugenie Filler, MD   3.125 mg at 07/07/14 1026  . enoxaparin (LOVENOX) injection 40 mg  40 mg Subcutaneous Q24H Jettie Booze, MD   40 mg at 07/06/14 2124  . hydrALAZINE (APRESOLINE) injection 10 mg  10 mg Intravenous Q6H PRN Jonetta Osgood, MD      . lisinopril (PRINIVIL,ZESTRIL) tablet 5 mg  5 mg Oral QHS Eugenie Filler, MD      . loratadine (CLARITIN) tablet 10 mg  10 mg Oral Daily Eugenie Filler, MD   10 mg at 07/07/14 0900  . menthol-cetylpyridinium (CEPACOL) lozenge 3 mg  1 lozenge Oral PRN Gardiner Barefoot, NP      . nicotine polacrilex (NICORETTE) gum 4 mg  4 mg Oral PRN Eugenie Filler, MD   4 mg at 07/06/14 2124  . ondansetron (ZOFRAN) injection 4 mg  4 mg Intravenous Q6H PRN Jettie Booze, MD      . oxybutynin (DITROPAN-XL) 24 hr  tablet 10 mg  10 mg Oral QHS Jonetta Osgood, MD   10 mg at 07/06/14 2124  . senna-docusate (Senokot-S) tablet 1 tablet  1 tablet Oral QHS Jonetta Osgood, MD   1 tablet at 07/06/14 2124    PE: General appearance: alert, cooperative and no distress Lungs: clear to auscultation bilaterally Heart: regular rate and rhythm, S1, S2 normal, no murmur, click, rub or gallop Extremities: No LEE Pulses: 2+ and symmetric Skin: Warm and dry Neurologic: Grossly normal  Lab Results:   Recent Labs  07/05/14 0252 07/06/14 0350 07/07/14 0432  WBC 11.7* 9.7 9.7  HGB 15.6* 14.6 14.1  HCT 45.8 42.9 42.0  PLT 311 297 281   BMET  Recent Labs  07/05/14 0252 07/06/14 0350 07/07/14 0432  NA 139 137 139  K 3.7 3.7 3.9  CL 105 102 104  CO2 26 29 28   GLUCOSE 102* 113* 107*  BUN 14 19 14   CREATININE 0.61 0.68 0.73  CALCIUM 9.3 9.3 9.1    Assessment/Plan   51F with HTN admitted with left hand numbness/clumsiness, found to have right parietal cortical infarct felt to be embolic. 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 Cath with 2V CAD.  1. Acute stroke - carotid duplex 07/02/14: no significant extracranial stenoses. LICA tortuous, mildly elevating velocities, bilateral ECAs c/w severe stenosis.  - Cardiac workup - 2D Echo 07/02/14: EF 65-70%, grade 1 DD. TEE 07/03/14 - EF 60-65%, left atrial mass, small PFO. Cardiac MRI could not further tissue characterization, but LA mass felt possibly the cause of her stroke   2. CAD:   Left atrial mass and subsequent cath showing severe 2-vessel CAD including ostial LAD and mid RCA - CVTS aware of findings per Dr. Alan Ripper note - should be OK for surgery in 1-2 weeks per neurology note, which is likely going to be this week.  CTS still needs to see.  - lipitor 80, Coreg 3.125BID  4. Small PFO by TEE   5. Right ACA aneurysm, 4.4cm - risk of rupture <1% per year - family history of ruptured  aneurysms - neuro input reveals not a candidate for treatment at this time given current stroke, will need OP neurosurgery consult  6. Abnormal CXR - "bandlike opacities in the right middle lobe and left lower lobe, with some nodularity along the left lower lobe opacity" -  CT angio chest pending.   6. HTN  Controlled 7. Hyperlipidemia LDL 112  statin 8. Tobacco abuse, cessation advised 9. Hyperglycemia A1C 6.3    LOS: 6 days    HAGER, BRYAN PA-C 07/07/2014 11:29 AM Patient seen and examined. I agree with the assessment and plan as detailed above. See also my additional thoughts below.   The plan is as outlined above. I agree. I spoke with the patient's family in the room. At this point the final time and will be up to the cardiothoracic surgery team.   Dola Argyle, MD, [facility]  07/07/2014 12:00 PM

## 2014-07-07 NOTE — Progress Notes (Signed)
[facility] PROGRESS NOTE  Hannah Nielsen CZY:[id number] DOB: Dec 07, 1950 DOA: 07/01/2014 PCP: [name], MD  Assessment/Plan: #1 acute CVA: Nondominant right high parietal cortical infarct embolic secondary to unknown source. Patient states left hand numbness clinically improving and close to baseline. MRI MRA of the head with right high parietal cortical infarct. 4.4 millimeter right A1/A2 aneurysm. Carotid Dopplers were done with no significant ICA stenosis. 2-D echo done with EF of 65-70%, no wall motion abnormalities, no cardiac source of emboli. Hemoglobin A1c is pending. Patient has been seen in consultation by neurology recommending a TEE to rule out embolic source. 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 Patient was seen on behalf of CT surgery by Dr. Harrington Challenger of cardiology and patient had MR cardiac yesterday 07/04/2014 which had a normal left ventricular size with moderate concentric hypertrophy EF of 62% with no wall motion abnormalities, normal right ventricular size and systolic function, normal by HO size, trivial mitral regurgitation, small mobile density measuring 13 x 8 mm attached to the lateral wall of the left H and located between left upper and left lower pulmonary veins. 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 Per neurology patient should be okay for surgery in 1-2 weeks. Per cardiology no need for loop recorder at this time. Per cardiology Dr. Roxy Manns of CT surgery aware of cath and MRI findings and plan is to operate next week probably Thursday. Continue full dose aspirin for secondary stroke prevention. Risk factor modification. PT/OT.   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 Could be likely etiology of patient's stroke. Patient was seen by Dr. Harrington Challenger per CT surgery regarding left atrial mass. Patient had cardiac MRI this  morning with preliminary findings per cardiology of a small mass along the lateral wall of the left atrium concern for possible fibroblastoma with final interpretation/evaluation pending. Per neurology patient is okay for surgery from a neuro standpoint in 1 weeks. Patient to be continued on aspirin. Patient s/p cardiac catheterization 07/04/14 which showed severe two-vessel coronary artery disease. Patient to be seen by CT surgery who will decide as to when patient is to have surgery this coming week.  #3 severe two-vessel coronary artery disease Per preoperative cardiac catheterization 07/04/2014. Patient noted to have severe two-vessel coronary artery disease including the ostial LAD and mid RCA. Per cardiology at time of surgery, patient will benefit from a LIMA to the LAD and recommending keeping patient in the hospital until Hannah Nielsen surgery. CT surgery to assess.   #4 right ACA aneurysm Noted on MRA of the head which was 4.4 mm. Patient currently asymptomatic. Risk of rupture less than 1%.. Patient with family history of ruptured aneurysms in the past. Risk factor modification. Blood pressure management. Outpatient follow-up with neurosurgery, Dr [name], for further evaluation and recommendations.  #5 hypertension Stable. Change norvasc to coreg. Decrease dose of lisinopril. Goal blood pressure systolic < 093.  #6 hyperlipidemia LDL of 112. Goal LDL less than 70. Continue low-dose statin. Outpatient follow-up.  #7 tobacco abuse Tobacco cessation. Nicotine gum.  #8 abnormal chest x-ray Outpatient CT chest for further evaluation. Patient currently asymptomatic.  #9 Pre diabetes Diet and lifestyle modification  #10 prophylaxis Lovenox for DVT prophylaxis.    Code Status: Full Family Communication: Updated patient, no family at bedside. Disposition Plan: Remain inpatient.   Consultants:  Neurology: Dr. Nicole Kindred 07/01/2014  Cardiology: Dr  Marlou Porch 07/02/14, Dr Harrington Challenger  07/03/14  Procedures:  2-D echo 07/02/2014  Carotid Dopplers 07/02/2014  Chest x-ray 07/01/2014  MRI/MRA head 07/01/2014   MR Cardiac 07/04/14  Cardiac catheterization Dr. Scarlette Calico 07/04/14--- severe 2 vessel coronary artery disease including ostial LAD and mid RCA.  Antibiotics:  None  HPI/Subjective: Patient states numbness and tingling has improved significantly and close to her baseline. No CP, No SOB. No complaints.  Objective: Filed Vitals:   07/07/14 0507  BP: 105/54  Pulse: 75  Temp: 97.8 F (36.6 C)  Resp: 20    Intake/Output Summary (Last 24 hours) at 07/07/14 1006 Last data filed at 07/06/14 1735  Gross per 24 hour  Intake    600 ml  Output      0 ml  Net    600 ml   Filed Weights   07/05/14 0354 07/06/14 0543 07/07/14 0507  Weight: 84.233 kg (185 lb 11.2 oz) 85.73 kg (189 lb) 86.274 kg (190 lb 3.2 oz)    Exam:   General:  NAD  Cardiovascular: RRR  Respiratory: CTAB  Abdomen: Soft/NT/ND/+BS  Musculoskeletal: No c/c/e  Data Reviewed: Basic Metabolic Panel:  Recent Labs Lab 07/03/14 0720 07/04/14 0550 07/05/14 0252 07/06/14 0350 07/07/14 0432  NA 140 139 139 137 139  K 3.9 3.8 3.7 3.7 3.9  CL 107 104 105 102 104  CO2 25 27 26 29 28   GLUCOSE 104* 108* 102* 113* 107*  BUN 12 11 14 19 14   CREATININE 0.56 0.63 0.61 0.68 0.73  CALCIUM 9.1 9.1 9.3 9.3 9.1   Liver Function Tests:  Recent Labs Lab 07/01/14 0810 07/05/14 0252  AST 25 18  ALT 19 22  ALKPHOS 77 75  BILITOT 0.8 0.9  PROT 6.4 6.7  ALBUMIN 3.6 3.5   No results for input(s): LIPASE, AMYLASE in the last 168 hours. No results for input(s): AMMONIA in the last 168 hours. CBC:  Recent Labs Lab 07/01/14 0810  07/03/14 0720 07/04/14 0550 07/05/14 0252 07/06/14 0350 07/07/14 0432  WBC 10.3  < > 10.0 9.5 11.7* 9.7 9.7  NEUTROABS 7.0  --   --   --   --   --   --   HGB 15.1*  < > 15.8* 15.2* 15.6* 14.6 14.1  HCT 43.4  < > 47.0* 44.7 45.8 42.9 42.0  MCV 91.9  < >  92.9 91.6 91.6 91.9 91.5  PLT 321  < > 306 313 311 297 281  < > = values in this interval not displayed. Cardiac Enzymes:  Recent Labs Lab 07/01/14 0810  TROPONINI <0.03   BNP (last 3 results) No results for input(s): BNP in the last 8760 hours.  ProBNP (last 3 results) No results for input(s): PROBNP in the last 8760 hours.  CBG:  Recent Labs Lab 07/01/14 2225 07/02/14 0645  GLUCAP 178* 106*    No results found for this or any previous visit (from the past 240 hour(s)).   Studies: No results found.  Scheduled Meds: . amLODipine  2.5 mg Oral QHS  . aspirin  324 mg Oral Daily  . atorvastatin  80 mg Oral q1800  . enoxaparin (LOVENOX) injection  40 mg Subcutaneous Q24H  . lisinopril  10 mg Oral QHS  . loratadine  10 mg Oral Daily  . oxybutynin  10 mg Oral QHS  . 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 [facility] Pager [phone]. If 7PM-7AM, please contact night-coverage at [email or web address], password [facility] 07/07/2014, 10:06 AM  LOS: 6 days

## 2014-07-07 NOTE — Progress Notes (Signed)
[id number] Pt had already walked and stated no CP with walking. Discussed sternal precautions and getting up and down without pressure on arms. Pt had room full of family and stated okay to educate with them in room. Discussed importance of walking and IS after surgery. Pt stated can get 2500 ml on IS. Did discuss that pt needs someone with her first week home and she stated will work on this as she does not want to have to go to Rehab. Has read [facility] booklet. Gave [facility] care guide. Pt going to watch preop video tonight.  Discussed smoking cessation and offered pt fake cigarette but she declined. Stated using nicorette gum now. When asked re walking, pt stated would prefer to walk on her own. We will follow up after surgery. Graylon Good RN BSN 07/07/2014 3:12 PM

## 2014-07-08 DIAGNOSIS — I2510 Atherosclerotic heart disease of native coronary artery without angina pectoris: Secondary | ICD-10-CM | POA: Diagnosis present

## 2014-07-08 DIAGNOSIS — I639 Cerebral infarction, unspecified: Secondary | ICD-10-CM

## 2014-07-08 DIAGNOSIS — R222 Localized swelling, mass and lump, trunk: Secondary | ICD-10-CM

## 2014-07-08 DIAGNOSIS — I25110 Atherosclerotic heart disease of native coronary artery with unstable angina pectoris: Secondary | ICD-10-CM

## 2014-07-08 LAB — CBC
HCT: 42.7 % (ref 36.0–46.0)
Hemoglobin: 14.1 g/dL (ref 12.0–15.0)
MCH: 30.7 pg (ref 26.0–34.0)
MCHC: 33 g/dL (ref 30.0–36.0)
MCV: 93 fL (ref 78.0–100.0)
Platelets: 315 10*3/uL (ref 150–400)
RBC: 4.59 MIL/uL (ref 3.87–5.11)
RDW: 13 % (ref 11.5–15.5)
WBC: 9.8 10*3/uL (ref 4.0–10.5)

## 2014-07-08 LAB — COMPREHENSIVE METABOLIC PANEL
ALT: 22 U/L (ref 0–35)
AST: 17 U/L (ref 0–37)
Albumin: 3.4 g/dL — ABNORMAL LOW (ref 3.5–5.2)
Alkaline Phosphatase: 68 U/L (ref 39–117)
Anion gap: 9 (ref 5–15)
BUN: 11 mg/dL (ref 6–23)
CO2: 28 mmol/L (ref 19–32)
Calcium: 9.4 mg/dL (ref 8.4–10.5)
Chloride: 104 mmol/L (ref 96–112)
Creatinine, Ser: 0.71 mg/dL (ref 0.50–1.10)
GFR calc Af Amer: >90 mL/min (ref >90)
GFR calc non Af Amer: 89 mL/min — ABNORMAL LOW (ref >90)
GLUCOSE: 94 mg/dL (ref 70–99)
POTASSIUM: 3.9 mmol/L (ref 3.5–5.1)
SODIUM: 141 mmol/L (ref 135–145)
Total Bilirubin: 0.8 mg/dL (ref 0.3–1.2)
Total Protein: 6.5 g/dL (ref 6.0–8.3)

## 2014-07-08 LAB — LIPID PANEL
CHOLESTEROL: 149 mg/dL (ref 0–200)
HDL: 30 mg/dL — AB (ref >39)
LDL Cholesterol: 74 mg/dL (ref 0–99)
TRIGLYCERIDES: 224 mg/dL — AB (ref <150)
Total CHOL/HDL Ratio: 5 RATIO
VLDL: 45 mg/dL — ABNORMAL HIGH (ref 0–40)

## 2014-07-08 LAB — URINE CULTURE: Colony Count: 30000

## 2014-07-08 MED ORDER — FENOFIBRATE 160 MG PO TABS
160.0000 mg | ORAL_TABLET | Freq: Every day | ORAL | Status: DC
  Administered 2014-07-08 – 2014-07-09 (×2): 160 mg via ORAL
  Filled 2014-07-08 (×2): qty 1

## 2014-07-08 NOTE — Progress Notes (Addendum)
VASCULAR LAB PRELIMINARY  PRELIMINARY  PRELIMINARY  PRELIMINARY  Pre-op Cardiac Surgery  Carotid Findings on 07-02-14:  Bilateral:  1-39% ICA stenosis.  Vertebral artery flow is antegrade.      Upper Extremity Right Left  Brachial Pressures 134 triphasic 139 triphasic  Radial Waveforms triphasic triphasic  Ulnar Waveforms triphasic triphasic  Palmar Arch (Allen's Test) WNL WNL   Findings:  Doppler waveforms remain normal with ulnar and radial compressions bilaterally.    Lower  Extremity Right Left  Dorsalis Pedis    Anterior Tibial 66 monophasic 94 monophasic  Posterior Tibial 133 triphasic 139 triphasic  Ankle/Brachial Indices 0.96 1.0    Findings:  ABI is within normal limits with abnormal Doppler waveforms noted in the anterior tibial artery bilaterally.   Crit Obremski, RVT 07/08/2014, 4:05 PM

## 2014-07-08 NOTE — Progress Notes (Signed)
[name]   Patient seen and examined, chart, TEE and cath reviewed. Full consult note to follow. We tentatively plan for OR on Thursday  Hannah Nielsen 07/08/2014 8:09 AM

## 2014-07-08 NOTE — Progress Notes (Addendum)
Patient: Hannah Nielsen / Admit Date: 07/01/2014 / Date of Encounter: 07/08/2014, 8:08 AM   Subjective: Feeling great. No CP or SOB.   Objective: Telemetry: NSR Physical Exam: Blood pressure 124/58, pulse 72, temperature 97.8 F (36.6 C), temperature source Oral, resp. rate 18, height 5' 2.5" (1.588 m), weight 191 lb 1.6 oz (86.682 kg), SpO2 94 %. General: Well developed, well nourished WF in no acute distress. Head: Normocephalic, atraumatic, sclera non-icteric, no xanthomas, nares are without discharge. Neck: JVP not elevated. Lungs: Clear bilaterally to auscultation without wheezes, rales, or rhonchi. Breathing is unlabored. Heart: RRR S1 S2 without murmurs, rubs, or gallops.  Abdomen: Soft, non-tender, non-distended with normoactive bowel sounds. No rebound/guarding. Extremities: No clubbing or cyanosis. No edema. Distal pedal pulses are 2+ and equal bilaterally. Right wrist site without hematoma/ecchymosis. Right groin ecchymosis that appears to be in early stages of resolution but soft. Neuro: Alert and oriented X 3. Moves all extremities spontaneously. Psych: Responds to questions appropriately with a normal affect.  Intake/Output Summary (Last 24 hours) at 07/08/14 5956 Last data filed at 07/07/14 1330  Gross per 24 hour  Intake    720 ml  Output      0 ml  Net    720 ml    Inpatient Medications:  . aspirin  324 mg Oral Daily  . atorvastatin  80 mg Oral q1800  . carvedilol  3.125 mg Oral BID WC  . enoxaparin (LOVENOX) injection  40 mg Subcutaneous Q24H  . fenofibrate  160 mg Oral Daily  . lisinopril  5 mg Oral QHS  . loratadine  10 mg Oral Daily  . oxybutynin  10 mg Oral QHS  . senna-docusate  1 tablet Oral QHS   Infusions:    Labs:  Recent Labs  07/07/14 0432 07/08/14 0401  NA 139 141  K 3.9 3.9  CL 104 104  CO2 28 28  GLUCOSE 107* 94  BUN 14 11  CREATININE 0.73 0.71  CALCIUM 9.1 9.4    Recent Labs  07/08/14 0401  AST 17  ALT 22  ALKPHOS 68    BILITOT 0.8  PROT 6.5  ALBUMIN 3.4*    Recent Labs  07/07/14 0432 07/08/14 0401  WBC 9.7 9.8  HGB 14.1 14.1  HCT 42.0 42.7  MCV 91.5 93.0  PLT 281 315   No results for input(s): CKTOTAL, CKMB, TROPONINI in the last 72 hours. Invalid input(s): POCBNP No results for input(s): HGBA1C in the last 72 hours.   Radiology/Studies:  Dg Chest 2 View  07/01/2014   CLINICAL DATA:  Stroke. Left hand numbness. cough and congestion. Hypertension.  EXAM: CHEST  2 VIEW  COMPARISON:  None.  FINDINGS: Bandlike density in the left lower lobe and right middle lobe. Vague nodularity along the left basilar opacity. Atherosclerotic calcification of the aortic arch. Calcifications along the left hilum likely reflecting old granulomatous disease. Thoracic spondylosis is present.  IMPRESSION: 1. Bandlike opacities in the right middle lobe and left lower lobe, with some nodularity along the left lower lobe opacity. Given the patient's smoking history and lack of prior chest imaging to establish stability, I do recommend nonurgent chest CT followup to further characterize these opacities and exclude underlying malignancy. 2. Atherosclerosis.   Electronically Signed   By: Hannah Nielsen M.D.   On: 07/01/2014 15:01   Ct Angio Chest Pe W/cm &/or Wo Cm  07/07/2014   CLINICAL DATA:  Interstitial and nodular opacity seen on recent chest x-ray 07/01/2014.  EXAM:  CT ANGIOGRAPHY CHEST WITH CONTRAST  TECHNIQUE: Multidetector CT imaging of the chest was performed using the standard protocol during bolus administration of intravenous contrast. Multiplanar CT image reconstructions and MIPs were obtained to evaluate the vascular anatomy.  CONTRAST:  100 mL Omnipaque 350  COMPARISON:  None.  FINDINGS: There is adequate opacification of the pulmonary arteries. There is no pulmonary embolus. Mild dilatation of the central pulmonary arteries as can be seen with pulmonary arterial hypertension. The heart size is normal. There is no  pericardial effusion. There is coronary artery atherosclerosis in the left main, circumflex and RCA. There is thoracic aortic atherosclerosis.  There is a small linear band of airspace disease at the left lung base peripherally which may reflect an area of fibrosis versus atelectasis. The lungs are otherwise clear. There is no focal consolidation, pleural effusion or pneumothorax.  There is no axillary, hilar, or mediastinal adenopathy.  There is no lytic or blastic osseous lesion.  The visualized portions of the upper abdomen are unremarkable.  Review of the MIP images confirms the above findings.  IMPRESSION: 1. No acute cardiopulmonary disease. 2. No pulmonary mass. 3. Coronary artery disease.   Electronically Signed   By: Hannah Nielsen   On: 07/07/2014 12:48   Mr Jodene Nam Head Wo Contrast  07/01/2014   CLINICAL DATA:  64 year old hypertensive female with history of smoking presenting with left hand numbness and clumsiness. Small acute infarct right precentral cortex noted on recent MR. Subsequent encounter.  EXAM: MRA HEAD WITHOUT CONTRAST  TECHNIQUE: Angiographic images of the Circle of Willis were obtained using MRA technique without intravenous contrast.  COMPARISON:  07/01/2014 brain MR.  FINDINGS: Motion degradation contributes to appearance of branch vessel irregularity.  Mild narrowing right internal carotid artery cavernous/supraclinoid segment junction.  4.4 mm aneurysm suspected arising at the junction of the A1/ A2 segment of the right anterior cerebral artery and directed inferiorly.  No high-grade stenosis of either carotid terminus or M1 segment of the middle cerebral artery.  Middle cerebral artery and A2 segment anterior cerebral artery branch vessel irregularity bilaterally may be related to atherosclerotic disease and/or result of motion artifact.  No significant stenosis of either distal vertebral artery.  Left posterior inferior cerebellar artery and right anterior inferior cerebellar artery not  visualized.  No significant stenosis of the basilar artery.  Mild irregularity of the superior cerebellar artery and distal branches of the posterior cerebral artery bilaterally  IMPRESSION: 4.4 mm aneurysm suspected arising at the junction of the A1/ A2 segment of the right anterior cerebral artery and directed inferiorly.  Mild narrowing right internal carotid artery cavernous/supraclinoid segment junction.  No high-grade stenosis of either carotid terminus or M1 segment of the middle cerebral artery.  Branch vessel irregularity as detailed above.  These results will be called to the ordering clinician or representative by the Radiologist Assistant, and communication documented in the PACS or zVision Dashboard.   Electronically Signed   By: Genia Del M.D.   On: 07/01/2014 16:39   Mr Brain Wo Contrast  07/01/2014   CLINICAL DATA:  Left arm weakness and tingling began 2 a.m. today.  EXAM: MRI HEAD WITHOUT CONTRAST  TECHNIQUE: Multiplanar, multiecho pulse sequences of the brain and surrounding structures were obtained without intravenous contrast.  COMPARISON:  None.  FINDINGS: Small area of acute infarct in the right precentral cortex over the convexity. This appears to be in the precentral cortex with scattered small areas of patchy restricted diffusion. No other acute infarct.  Ventricle size normal.  Cerebral volume normal.  Negative for intracranial hemorrhage.  Negative for mass or edema.  Paranasal sinuses are clear.  Enlargement of the sella which is filled with CSF. Possible arachnoid cyst versus empty sella. Pituitary normal in size.  IMPRESSION: Small area of acute infarct right precentral cortex over the convexity. Negative for hemorrhage.   Electronically Signed   By: Franchot Gallo M.D.   On: 07/01/2014 10:00   Mr Card Morphology Wo/w Cm  07/04/2014   CLINICAL DATA:  64 year old female with stroke and left atrial mass on TEE.  EXAM: CARDIAC MRI  TECHNIQUE: The patient was scanned on a 1.5  Tesla GE magnet. A dedicated cardiac coil was used. Functional imaging was done using Fiesta sequences. 2,3, and 4 chamber views were done to assess for RWMA's. Modified Simpson's rule using a short axis stack was used to calculate an ejection fraction on a dedicated work Conservation officer, nature. The patient received 27 cc of Multihance. After 10 minutes inversion recovery sequences were used to assess for infiltration and scar tissue.  CONTRAST:  27 cc  of Multihance  FINDINGS: 1.Normal left ventricular size with moderate concentric hypertrophy and normal systolic function (LVEF = 62%). No regional wall motion abnormalities.  The measurements are as follows:  LVEDD:  43 mm  LVESD:  26 mm  LVEDV:  100 ml  LVESV:  38 ml  SV:  62 ml  CO:  4.7 L/minute  Myocardial mass:  137 g  2. Normal right ventricular size, thickness and systolic function (RVEF= 31%).  RVEDV:  109 ml  RVESV:  56 ml  SV:  54 ml  3.  Normal biatrial size.  4.  Trivial mitral regurgitation.  5. There is a small mobile density measuring 13 x 8 mm attached to the lateral wall of the left atrium. The mass is located between left upper and left lower pulmonary veins. There is no obvious penetration into the wall. There is no other mass seen anywhere else in the heart.  IMPRESSION: 1. Normal left ventricular size with moderate concentric hypertrophy and normal systolic function (LVEF = 62%). No regional wall motion abnormalities.  2. Normal right ventricular size, thickness and systolic function (RVEF= 51%).  3.  Normal biatrial size.  4.  Trivial mitral regurgitation.  5. There is a small mobile density measuring 13 x 8 mm attached to the lateral wall of the left atrium located between left upper and left lower pulmonary veins. There is no obvious penetration into the wall. There is no other mass seen anywhere else in the heart. 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  Unfortunately, the size of the mass and mobile character  didn't allow further tissue characterization.  Ena Dawley   Electronically Signed   By: Ena Dawley   On: 07/04/2014 14:13     Assessment and Plan  49F with HTN admitted with left hand numbness/clumsiness, found to have right parietal cortical infarct felt to be embolic. 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 Cath with 2V CAD.  1. Acute stroke - carotid duplex 07/02/14: no significant extracranial stenoses. LICA tortuous, mildly elevating velocities, BECAs c/w severe stenosis.  - Cardiac workup - 2D Echo 07/02/14: EF 65-70%, grade 1 DD. TEE 07/03/14 - EF 60-65%, left atrial mass, small PFO. Cardiac MRI could not further tissue characterization, but LA mass felt possibly the cause of her stroke - at this time, neuro  had recommended aspirin for anticoagulation  2. Left atrial mass and subsequent cath showing severe 2-vessel CAD including ostial LAD and mid RCA - CVTS plans OR 07/10/14 (neuro was OK with surgery 1-2 weeks post-stroke which was 3/22) - Dr. Irish Lack recommended keeping her in the hospital until her surgery   3. Small PFO by TEE - will confer with MD - does she need LE duplex to exclude DVT to complete stroke workup?  4. Right ACA aneurysm, 4.4cm - risk of rupture <1% per year - family history of ruptured aneurysms - neuro input reveals not a candidate for treatment at this time given current stroke, will need OP neurosurgery consult  5. Abnormal CXR - f/u CT showed no acute CP disease and no pulmonary mass 6. HTN, controlled 7. Hyperlipidemia LDL 112, on high dose statin now 8. Tobacco abuse, cessation advised 9. Hyperglycemia A1C 6.3  Signed, Dayna Dunn PA-C Patient seen and examined. I agree with the assessment and plan as detailed above. See also my additional thoughts below.   The patient is stable today. The patient has been seen by Dr. Roxy Manns. Surgery is tentatively scheduled for Thursday.   Dola Argyle,  MD, [facility] 07/08/2014 10:51 AM

## 2014-07-08 NOTE — Consult Note (Signed)
[address]       ,[place] [address]             [phone]          CARDIOTHORACIC SURGERY CONSULTATION REPORT  PCP is [name], MD Referring Provider is Candee Furbish, MD   Reason for consultation:  Left atrial mass  HPI:  Patient is a 64 year old obese white female with no previous cardiac history but risk factors notable for history of hypertension and long-standing tobacco abuse.  The patient was in her usual state of health until the early morning hours of 07/01/2014 when she awoke from her sleep with numbness and weakness involving her left hand and forearm. Symptoms persisted for several hours, ultimately prompting the patient to present to the emergency department.  She had persistent numbness and weakness involving her left hand, and could not grasp objects at all with her left hand. She had no associated symptoms in the left lower extremity, right upper or lower extremity, either side of her face. She had no visual disturbances, dysarthria, or other focal abnormalities. There was no associated symptoms of chest pain, shortness of breath, headaches. MRI of the head revealed a small acute infarct in the right precentral cortex without hemorrhage. Subsequent MRA of the brain revealed a small (4.4 mm) aneurysm involving the right anterior cerebral artery that was likely unrelated to the patient's presenting symptoms.  Symptoms improved fairly rapidly and nearly completely resolved within 24 hours. Carotid duplex scan was notable for the absence of cerebrovascular disease. Transthoracic and transesophageal echo revealed what appeared to be a mass in the left atrium that was initially felt to be a possible myxoma. Cardiothoracic surgery consultation was requested.  TEE findings were noted to be atypical.  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  The patient was cleared for possible elective surgical intervention by the neurology team, and subsequent diagnostic cardiac catheterization revealed severe 2 vessel coronary artery disease.  The patient is divorced and lives alone locally in [place]. 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 She lives a somewhat sedentary lifestyle but she reports no specific physical limitations.  She describes a long-standing history of symptoms of exertional shortness of breath which occur only with relatively strenuous activity. She denies any history of substernal chest pain or chest tightness either with activity or at rest. She has never had any palpitations, dizzy spells, nor syncope. She has not had any arrhythmias documented during her current hospitalization.    Past Medical History  Diagnosis Date  . Hypertension   . 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 CAD (coronary artery disease), native coronary artery: Severe 2 vessel dz per cardiac cath 07/04/14 07/04/2014  . Left atrial mass 07/03/2014  . Stroke 10/05/9483    acute embolic stroke to right MCA territory  . Hyperlipidemia 07/02/2014    Past Surgical History  Procedure Laterality Date  . Abdominal hysterectomy    . Left heart catheterization with coronary angiogram N/A 07/04/2014    Procedure: LEFT HEART CATHETERIZATION WITH CORONARY ANGIOGRAM;  Surgeon: Jettie Booze, MD;  Location: [facility] CATH LAB;  Service: Cardiovascular;  Laterality: N/A;  .  Tee without cardioversion N/A 07/03/2014    Procedure: TRANSESOPHAGEAL ECHOCARDIOGRAM (TEE);  Surgeon: Jerline Pain, MD;  Location: [facility] ENDOSCOPY;  Service: Cardiovascular;  Laterality: N/A;    Family History  Problem Relation Age of Onset  . Hypertension Mother   . Heart  failure Mother     Deceased  . Hypertension Sister   . Hypertension Brother   . Cerebral aneurysm Father 30    Deceased    History   Social History  . Marital Status: Divorced    Spouse Name: N/A  . Number of Children: N/A  . Years of Education: N/A   Occupational History  . Not on file.   Social History Main Topics  . Smoking status: Current Every Day Smoker  . Smokeless tobacco: Not on file  . Alcohol Use: Yes  . Drug Use: No  . Sexual Activity: Not on file   Other Topics Concern  . Not on file   Social History Narrative    Prior to Admission medications   Medication Sig Start Date End Date Taking? Authorizing Provider  acetaminophen (TYLENOL) 500 MG tablet Take 500-1,000 mg by mouth every 6 (six) hours as needed for mild pain or moderate pain.   Yes Historical Provider, MD  amLODipine (NORVASC) 2.5 MG tablet Take 2.5 mg by mouth at bedtime.    Yes Historical Provider, MD  aspirin 81 MG tablet Take 81 mg by mouth daily.   Yes Historical Provider, MD  CALCIUM PO Take 1 tablet by mouth daily.   Yes Historical Provider, MD  cetirizine (ZYRTEC) 10 MG tablet Take 10 mg by mouth at bedtime.   Yes Historical Provider, MD  hydrochlorothiazide (MICROZIDE) 12.5 MG capsule Take 12.5 mg by mouth daily.   Yes Historical Provider, MD  KRILL OIL PO Take 1 tablet by mouth daily.   Yes Historical Provider, MD  lisinopril (PRINIVIL,ZESTRIL) 10 MG tablet Take 10 mg by mouth at bedtime.    Yes Historical Provider, MD  LUTEIN PO Take 1 tablet by mouth daily.   Yes Historical Provider, MD  Multiple Vitamins-Minerals (MULTIVITAL) tablet Take 1 tablet by mouth daily.   Yes Historical Provider, MD  oxybutynin (DITROPAN-XL) 10 MG 24 hr tablet Take 10 mg by mouth at bedtime.   Yes Historical Provider, MD    Current Facility-Administered Medications  Medication Dose Route Frequency Provider Last Rate Last Dose  . acetaminophen (TYLENOL) tablet 650 mg  650 mg Oral Q4H PRN Jettie Booze,  MD      . albuterol (PROVENTIL) (2.5 MG/3ML) 0.083% nebulizer solution 2.5 mg  2.5 mg Nebulization Q2H PRN Jonetta Osgood, MD   2.5 mg at 07/07/14 1345  . aspirin chewable tablet 324 mg  324 mg Oral Daily Deboraha Sprang, MD   324 mg at 07/08/14 1308  . atorvastatin (LIPITOR) tablet 80 mg  80 mg Oral q1800 Dayna N Dunn, PA-C   80 mg at 07/08/14 1706  . carvedilol (COREG) tablet 3.125 mg  3.125 mg Oral BID WC Eugenie Filler, MD   3.125 mg at 07/08/14 1627  . enoxaparin (LOVENOX) injection 40 mg  40 mg Subcutaneous Q24H Jettie Booze, MD   40 mg at 07/07/14 2130  . fenofibrate tablet 160 mg  160 mg Oral Daily Eugenie Filler, MD   160 mg at 07/08/14 6578  . hydrALAZINE (APRESOLINE) injection 10 mg  10 mg Intravenous Q6H PRN Jonetta Osgood, MD      . lisinopril (PRINIVIL,ZESTRIL) tablet  5 mg  5 mg Oral QHS Eugenie Filler, MD   5 mg at 07/07/14 2129  . loratadine (CLARITIN) tablet 10 mg  10 mg Oral Daily Eugenie Filler, MD   10 mg at 07/08/14 2671  . menthol-cetylpyridinium (CEPACOL) lozenge 3 mg  1 lozenge Oral PRN Gardiner Barefoot, NP      . nicotine polacrilex (NICORETTE) gum 4 mg  4 mg Oral PRN Eugenie Filler, MD   4 mg at 07/08/14 1629  . ondansetron (ZOFRAN) injection 4 mg  4 mg Intravenous Q6H PRN Jettie Booze, MD      . oxybutynin (DITROPAN-XL) 24 hr tablet 10 mg  10 mg Oral QHS Jonetta Osgood, MD   10 mg at 07/07/14 2129  . senna-docusate (Senokot-S) tablet 1 tablet  1 tablet Oral QHS Jonetta Osgood, MD   1 tablet at 07/06/14 2124    Allergies  Allergen Reactions  . Sulfa Antibiotics       Review of Systems:   General:  normal appetite, normal energy, + weight gain, no weight loss, no fever  Cardiac:  no chest pain with exertion, no chest pain at rest, + SOB with exertion, no resting SOB, no PND, no orthopnea, no palpitations, no arrhythmia, no atrial fibrillation, no LE edema, no dizzy spells, no syncope  Respiratory:  + exertional shortness  of breath, no home oxygen, no productive cough, + intermittent dry cough, no bronchitis, no wheezing, no hemoptysis, no asthma, no pain with inspiration or cough, + likely sleep apnea, no CPAP at night  GI:   no difficulty swallowing, no reflux, no frequent heartburn, no hiatal hernia, no abdominal pain, no constipation, no diarrhea, no hematochezia, no hematemesis, no melena  GU:   no dysuria,  no frequency, no urinary tract infection, no hematuria, no kidney stones, no kidney disease  Vascular:  no pain suggestive of claudication, no pain in feet, no leg cramps, no varicose veins, no DVT, no non-healing foot ulcer  Neuro:   + stroke, no TIA's, no seizures, no headaches, no temporary blindness one eye,  no slurred speech, no peripheral neuropathy, no chronic pain, no instability of gait, no memory/cognitive dysfunction  Musculoskeletal: no arthritis, no joint swelling, no myalgias, no difficulty walking, normal mobility   Skin:   no rash, no itching, no skin infections, no pressure sores or ulcerations  Psych:   no anxiety, no depression, no nervousness, no unusual recent stress  Eyes:   no blurry vision, no floaters, no recent vision changes, + wears glasses or contacts  ENT:   no hearing loss, no loose or painful teeth, no dentures, last saw dentist within the past year  Hematologic:  no easy bruising, no abnormal bleeding, no clotting disorder, no frequent epistaxis  Endocrine:  no diabetes, does not check CBG's at home     Physical Exam:   BP 124/58 mmHg  Pulse 72  Temp(Src) 97.8 F (36.6 C) (Oral)  Resp 18  Ht 5' 2.5" (1.588 m)  Wt 86.682 kg (191 lb 1.6 oz)  BMI 34.37 kg/m2  SpO2 94%  General:  Obese but o/w  well-appearing  HEENT:  Unremarkable   Neck:   no JVD, no bruits, no adenopathy   Chest:   clear to auscultation, symmetrical breath sounds, no wheezes, no rhonchi   CV:   RRR, no murmur   Abdomen:  soft, non-tender, no masses   Extremities:  warm, well-perfused, pulses  palpable,  lower extremity edema  Rectal/GU  Deferred  Neuro:   Grossly non-focal and symmetrical throughout  Skin:   Clean and dry, no rashes, no breakdown  Diagnostic Tests:  MRI HEAD WITHOUT CONTRAST  TECHNIQUE: Multiplanar, multiecho pulse sequences of the brain and surrounding structures were obtained without intravenous contrast.  COMPARISON: None.  FINDINGS: Small area of acute infarct in the right precentral cortex over the convexity. This appears to be in the precentral cortex with scattered small areas of patchy restricted diffusion. No other acute infarct.  Ventricle size normal. Cerebral volume normal.  Negative for intracranial hemorrhage.  Negative for mass or edema.  Paranasal sinuses are clear.  Enlargement of the sella which is filled with CSF. Possible arachnoid cyst versus empty sella. Pituitary normal in size.  IMPRESSION: Small area of acute infarct right precentral cortex over the convexity. Negative for hemorrhage.   Electronically Signed  By: Franchot Gallo M.D.  On: 07/01/2014 10:00    MRA HEAD WITHOUT CONTRAST  TECHNIQUE: Angiographic images of the Circle of Willis were obtained using MRA technique without intravenous contrast.  COMPARISON: 07/01/2014 brain MR.  FINDINGS: Motion degradation contributes to appearance of branch vessel irregularity.  Mild narrowing right internal carotid artery cavernous/supraclinoid segment junction.  4.4 mm aneurysm suspected arising at the junction of the A1/ A2 segment of the right anterior cerebral artery and directed inferiorly.  No high-grade stenosis of either carotid terminus or M1 segment of the middle cerebral artery.  Middle cerebral artery and A2 segment anterior cerebral artery branch vessel irregularity bilaterally may be related to atherosclerotic disease and/or result of motion artifact.  No significant stenosis of either distal vertebral  artery.  Left posterior inferior cerebellar artery and right anterior inferior cerebellar artery not visualized.  No significant stenosis of the basilar artery.  Mild irregularity of the superior cerebellar artery and distal branches of the posterior cerebral artery bilaterally  IMPRESSION: 4.4 mm aneurysm suspected arising at the junction of the A1/ A2 segment of the right anterior cerebral artery and directed inferiorly.  Mild narrowing right internal carotid artery cavernous/supraclinoid segment junction.  No high-grade stenosis of either carotid terminus or M1 segment of the middle cerebral artery.  Branch vessel irregularity as detailed above.  These results will be called to the ordering clinician or representative by the Radiologist Assistant, and communication documented in the PACS or zVision Dashboard.   Electronically Signed  By: Genia Del M.D.  On: 07/01/2014 16:39   Transthoracic Echocardiography  Patient:  Hannah Nielsen, Hannah Nielsen MR #:    [id number] Study Date: 07/02/2014 Gender:   F Age:    71 Height:   157.5 cm Weight:   83 kg BSA:    1.94 m^2 Pt. Status: Room:    4N11C  SONOGRAPHER Diamond Nickel ATTENDING  Philippa Sicks, Shanker M PERFORMING  [facility], Inpatient  cc:  ------------------------------------------------------------------- LV EF: 65% -  70%  ------------------------------------------------------------------- Indications:   CVA 61.  ------------------------------------------------------------------- History:  Risk factors: Current tobacco use. Hypertension.  ------------------------------------------------------------------- Study Conclusions  - Left ventricle: The cavity size was normal. There was mild concentric hypertrophy. Systolic function was vigorous. The estimated ejection fraction was in the range of 65% to 70%. Wall motion was normal; there  were no regional wall motion abnormalities. Doppler parameters are consistent with abnormal left ventricular relaxation (grade 1 diastolic dysfunction).  Impressions:  - No cardiac source of emboli was indentified.  Transthoracic echocardiography. M-mode, complete 2D, spectral Doppler, and color Doppler. Birthdate: Patient birthdate: 1950/06/13. Age: Patient is  64 yr old. Sex: Gender: female. BMI: 33.5 kg/m^2. Blood pressure:   127/94 Patient status: Inpatient. Study date: Study date: 07/02/2014. Study time: 01:20 PM. Location: Echo laboratory.  -------------------------------------------------------------------  ------------------------------------------------------------------- Left ventricle: The cavity size was normal. There was mild concentric hypertrophy. Systolic function was vigorous. The estimated ejection fraction was in the range of 65% to 70%. Wall motion was normal; there were no regional wall motion abnormalities. Doppler parameters are consistent with abnormal left ventricular relaxation (grade 1 diastolic dysfunction). There was mildly increased left ventricular outflow tract velocity consistent with hyperdynamic contraction.  ------------------------------------------------------------------- Aortic valve:  Trileaflet; mildly thickened, mildly calcified leaflets. Mobility was not restricted. Doppler: Transvalvular velocity was within the normal range. There was no stenosis. There was no regurgitation.  ------------------------------------------------------------------- Aorta: Aortic root: The aortic root was normal in size.  ------------------------------------------------------------------- Mitral valve:  Structurally normal valve.  Mobility was not restricted. Doppler: Transvalvular velocity was within the normal range. There was no evidence for stenosis. There was no regurgitation.  Peak gradient (D): 3 mm  Hg.  ------------------------------------------------------------------- Left atrium: The atrium was at the upper limits of normal in size.  ------------------------------------------------------------------- Right ventricle: The cavity size was normal. Wall thickness was normal. Systolic function was normal.  ------------------------------------------------------------------- Pulmonic valve:  Structurally normal valve.  Cusp separation was normal. Doppler: Transvalvular velocity was within the normal range. There was no evidence for stenosis. There was trivial regurgitation.  ------------------------------------------------------------------- Tricuspid valve:  Structurally normal valve.  Doppler: Transvalvular velocity was within the normal range. There was no regurgitation.  ------------------------------------------------------------------- Pulmonary artery:  The main pulmonary artery was normal-sized. Systolic pressure was within the normal range.  ------------------------------------------------------------------- Right atrium: The atrium was normal in size.  ------------------------------------------------------------------- Pericardium: There was no pericardial effusion.  ------------------------------------------------------------------- Systemic veins: Inferior vena cava: The vessel was normal in size.  ------------------------------------------------------------------- Measurements  Left ventricle              Value    Reference LV ID, ED, PLAX chordal    (L)    37.9 mm   43 - 52 LV ID, ES, PLAX chordal    (L)    22  mm   23 - 38 LV fx shortening, PLAX chordal      42  %   >=29 LV PW thickness, ED           14.6 mm   --------- IVS/LV PW ratio, ED           0.77     <=1.3 LV e&', lateral              8.33 cm/s  --------- LV E/e&', lateral              10.59    --------- LV e&', medial              6.25 cm/s  --------- LV E/e&', medial             14.11    --------- LV e&', average              7.29 cm/s  --------- LV E/e&', average             12.1     ---------  Ventricular septum            Value    Reference IVS thickness, ED            11.2 mm   ---------  LVOT  Value    Reference LVOT ID, S                20  mm   --------- LVOT area                3.14 cm^2  ---------  Aorta                  Value    Reference Aortic root ID, ED            32  mm   ---------  Left atrium               Value    Reference LA ID, A-P, ES              40  mm   --------- LA ID/bsa, A-P              2.06 cm/m^2 <=2.2 LA volume, S               60  ml   --------- LA volume/bsa, S             30.9 ml/m^2 --------- LA volume, ES, 1-p A4C          54  ml   --------- LA volume/bsa, ES, 1-p A4C        27.8 ml/m^2 --------- LA volume, ES, 1-p A2C          64  ml   --------- LA volume/bsa, ES, 1-p A2C        33  ml/m^2 ---------  Mitral valve               Value    Reference Mitral E-wave peak velocity       88.2 cm/s  --------- Mitral A-wave peak velocity       136  cm/s  --------- Mitral deceleration time    (H)    257  ms   150 - 230 Mitral peak gradient, D         3   mm Hg --------- Mitral E/A ratio, peak          0.6     ---------  Right ventricle             Value    Reference RV s&', lateral, S            18  cm/s  ---------  Legend: (L) and (H) mark values outside  specified reference range.  ------------------------------------------------------------------- Prepared and Electronically Authenticated by  Candee Furbish, M.D. 2016-03-23T14:31:50   Transesophageal Echocardiography  (Report amended )  Patient:  Alejandro, Adcox MR #:    [id number] Study Date: 07/03/2014 Gender:   F Age:    50 Height:   157.5 cm Weight:   84.1 kg BSA:    1.95 m^2 Pt. Status: Room:    4N11C  ATTENDING  Eugenie Filler PERFORMING  Candee Furbish, M.D. ADMITTING  Ghimire, Shanker M ORDERING   Tarri Fuller W REFERRING  Brett Canales SONOGRAPHER Jimmy Reel, RDCS  cc:  ------------------------------------------------------------------- LV EF: 60% -  65%  ------------------------------------------------------------------- Indications:   CVA 17.  ------------------------------------------------------------------- Study Conclusions  - Left ventricle: The cavity size was normal. Wall thickness was normal. Systolic function was normal. The estimated ejection fraction was in the range of 60% to 65%. - Left atrium: There is a 1 x 1.3cm well circumscribed mass on the lateral surface of the left atrium just below the takeoff  of left superior pulmonary vein that is fixed to the left atrial surface with multiple frond like mobile fingerlike projections. Mass is suspicious for myxoma with associated thrombogenic material or perhaps non valvular fibroelastoma with its frond like appearance. - Atrial septum: Very small patent foramen ovale was identified. Echo contrast study showed very mild right-to-left atrial level shunt (small quantity of bubble cross over), following an increase in RA pressure induced by provocative maneuvers.  Impressions:  - Source of emboli has been identified. Left atrial mass. Discussed with patient and daughter. Consultation with [facility].  Diagnostic transesophageal  echocardiography. 2D and color Doppler. Birthdate: Patient birthdate: 1951/02/10. Age: Patient is 65 yr old. Sex: Gender: female.  BMI: 33.9 kg/m^2. Blood pressure: 117/72 Patient status: Inpatient. Study date: Study date: 07/03/2014. Study time: 08:57 AM. Location: Endoscopy.  -------------------------------------------------------------------  ------------------------------------------------------------------- Left ventricle: The cavity size was normal. Wall thickness was normal. Systolic function was normal. The estimated ejection fraction was in the range of 60% to 65%.  ------------------------------------------------------------------- Aortic valve:  Structurally normal valve.  Cusp separation was normal. No evidence of vegetation. Doppler: There was no regurgitation.  ------------------------------------------------------------------- Aorta: The aorta was not dilated and moderately calcified.  ------------------------------------------------------------------- Mitral valve:  Structurally normal valve.  Leaflet separation was normal. No evidence of vegetation. Doppler: There was trivial regurgitation.  ------------------------------------------------------------------- Left atrium: There is a 1 x 1.3cm well circumscribed mass on the lateral surface of the left atrium just below the takeoff of left superior pulmonary vein that is fixed to the left atrial surface with multiple frond like mobile fingerlike projections. Mass is suspicious for myxoma with associated thrombogenic material or perhaps non valvular fibroelastoma with its frond like appearance.  ------------------------------------------------------------------- Atrial septum: Very small patent foramen ovale was identified. Echo contrast study showed very mild right-to-left atrial level shunt (small quantity of bubble cross over), following an increase in RA pressure induced by  provocative maneuvers.  ------------------------------------------------------------------- Right ventricle: The cavity size was normal. Wall thickness was normal. Systolic function was normal.  ------------------------------------------------------------------- Pulmonic valve:  Structurally normal valve.  Cusp separation was normal. No evidence of vegetation.  ------------------------------------------------------------------- Tricuspid valve:  Structurally normal valve.  Leaflet separation was normal. No evidence of vegetation. Doppler: There was trivial regurgitation.  ------------------------------------------------------------------- Pulmonary artery:  The main pulmonary artery was normal-sized.  ------------------------------------------------------------------- Right atrium: The atrium was normal in size. No evidence of thrombus in the atrial cavity or appendage.  ------------------------------------------------------------------- Pericardium: The pericardium was normal in appearance. There was no pericardial effusion.  ------------------------------------------------------------------- Post procedure conclusions Ascending Aorta:  - The aorta was not dilated and moderately calcified.  ------------------------------------------------------------------- Lovenia Kim, M.D. 2016-03-24T16:55:15   CARDIAC MRI  TECHNIQUE: The patient was scanned on a 1.5 Tesla GE magnet. A dedicated cardiac coil was used. Functional imaging was done using Fiesta sequences. 2,3, and 4 chamber views were done to assess for RWMA's. Modified Simpson's rule using a short axis stack was used to calculate an ejection fraction on a dedicated work Conservation officer, nature. The patient received 27 cc of Multihance. After 10 minutes inversion recovery sequences were used to assess for infiltration and scar tissue.  CONTRAST: 27 cc of  Multihance  FINDINGS: 1.Normal left ventricular size with moderate concentric hypertrophy and normal systolic function (LVEF = 62%). No regional wall motion abnormalities.  The measurements are as follows:  LVEDD: 43 mm  LVESD: 26 mm  LVEDV: 100 ml  LVESV: 38 ml  SV: 62 ml  CO: 4.7 L/minute  Myocardial mass:  137 g  2. Normal right ventricular size, thickness and systolic function (RVEF= 93%).  RVEDV: 109 ml  RVESV: 56 ml  SV: 54 ml  3. Normal biatrial size.  4. Trivial mitral regurgitation.  5. There is a small mobile density measuring 13 x 8 mm attached to the lateral wall of the left atrium. The mass is located between left upper and left lower pulmonary veins. There is no obvious penetration into the wall. There is no other mass seen anywhere else in the heart.  IMPRESSION: 1. Normal left ventricular size with moderate concentric hypertrophy and normal systolic function (LVEF = 62%). No regional wall motion abnormalities.  2. Normal right ventricular size, thickness and systolic function (RVEF= 26%).  3. Normal biatrial size.  4. Trivial mitral regurgitation.  5. There is a small mobile density measuring 13 x 8 mm attached to the lateral wall of the left atrium located between left upper and left lower pulmonary veins. There is no obvious penetration into the wall. There is no other mass seen anywhere else in the heart. 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  Unfortunately, the size of the mass and mobile character didn't allow further tissue characterization.  Ena Dawley   CARDIAC CATHETERIZATION  PROCEDURE: Left heart catheterization with selective coronary angiography,  INDICATIONS: Preoperative eval for cardiac surgery, left atrial mass  The risks, benefits, and details of the procedure were explained to the patient. The patient verbalized understanding and wanted to  proceed. Informed written consent was obtained.  PROCEDURE TECHNIQUE: After Xylocaine anesthesia, right radial access was attempted. The right radial artery was punctured with a needle but we are unable to pass a wire. Multiple attempts were performed and the radial artery was accessed several times but were unable to pass the wire. Ultrasound guidance was then obtained. Did appear that the radial artery was somewhat small but was again punctured without success in advancing the wire. The radial artery site was bandaged and the femoral site was prepped. After Xylocaine anesthesia a 33F sheath was placed in the right femoral artery with a single anterior needle wall stick. Left coronary angiography was done using a Judkins L4 guide catheter. Right coronary angiography was done using a Judkins R4 guide catheter. Left heart cath was done using a JR4 catheter.   CONTRAST: Total of 45 cc.  COMPLICATIONS: None.   HEMODYNAMICS: Aortic pressure was 127/66; LV pressure was 125/0; LVEDP 2. There was no gradient between the left ventricle and aorta.   ANGIOGRAPHIC DATA: The left main coronary artery is a large vessel with mild distal disease.  The left anterior descending artery is a large vessel with an 95% ostial stenosis. There are several small to medium size diagonal vessels which appear patent. There is mild, diffuse disease in the LAD.  The left circumflex artery is a large vessel. There is mild disease in the proximal to mid vessel. The first obtuse marginal is large . At the origin of the second obtuse marginal, there is moderate disease across the origin of this vessel, up to 40- 50%.  The right coronary artery is a medium sized dominant vessel. In the mid segment, there is diffuse disease, up to 75%. The posterior descending artery is medium-sized and patent. The posterior lateral artery is medium-sized and patent.  LEFT VENTRICULOGRAM: Left ventricular angiogram was not done.  LVEDP was 2 mmHg.  IMPRESSIONS:  1. Mild disease in the distal left main coronary artery. 2. Severe disease in the ostial left  anterior descending artery.. 3. Mild to moderate disease in the left circumflex artery and its branches. 4. Severe disease in the mid right coronary artery. 5. Left ventricular systolic function not assessed. LVEDP 2 mmHg.  6. Unable to access right radial artery even with ultrasound guidance due to vasospasm.  RECOMMENDATION: She has severe two-vessel coronary artery disease including the ostial LAD and mid right coronary artery. At the time of her cardiac surgery, she would benefit from LIMA to LAD. I would recommend keeping her in the hospital until her surgery.  Continue aggressive secondary prevention. I will defer the decision about anticoagulation until surgery to neurology. At this point, the patient is stable and without angina currently.    Impression:  The patient presents with an acute embolic stroke and has a relatively small mass in the left atrium.  Characteristics of the mass by TEE and cardiac-gated MRI of the heart are most suggestive of papillary fibro-elastoma.  The patient's stroke was relatively small, without any associated signs of hemorrhage, and all of her symptoms resolved quickly. I agree it would be best to proceed with elective surgical resection during this hospitalization. Cardiac catheterization demonstrates the presence of severe 2 vessel coronary artery disease with high-grade ostial stenosis of the left anterior descending coronary artery and the mid right coronary artery. Transesophageal echocardiogram also demonstrates the presence of a small patent foramen ovale.    Plan:  I have reviewed the indications, risks, and potential benefits of resection of her left atrial mass, coronary artery bypass grafting, and closure of patent foramen ovale with the patient and her daughter.  Alternative treatment strategies have been  discussed.  The patient understands and accepts all potential associated risks of surgery including but not limited to risk of death, stroke or other neurologic complication, myocardial infarction, congestive heart failure, respiratory failure, renal failure, bleeding requiring blood transfusion and/or reexploration, aortic dissection or other major vascular complication, arrhythmia, heart block or bradycardia requiring permanent pacemaker, pneumonia, pleural effusion, wound infection, pulmonary embolus or other thromboembolic complication, chronic pain or other delayed complications related to median sternotomy, or the late recurrence of symptomatic ischemic heart disease and/or congestive heart failure.  The importance of long term risk modification have been emphasized.  All questions answered.  We plan to proceed with surgery on Thursday, 07/10/2014.  I spent in excess of 120 minutes during the conduct of this hospital consultation and >50% of this time involved direct face-to-face encounter for counseling and/or coordination of the patient's care.   Valentina Gu. Roxy Manns, MD 07/08/2014 8:13 PM

## 2014-07-08 NOTE — Progress Notes (Signed)
[facility] PROGRESS NOTE  Hannah Nielsen VHQ:[id number] DOB: December 17, 1950 DOA: 07/01/2014 PCP: [name], MD  Assessment/Plan: #1 acute CVA: Nondominant right high parietal cortical infarct embolic secondary to unknown source. Patient states left hand numbness clinically improving and close to baseline. MRI MRA of the head with right high parietal cortical infarct. 4.4 millimeter right A1/A2 aneurysm. Carotid Dopplers were done with no significant ICA stenosis. 2-D echo done with EF of 65-70%, no wall motion abnormalities, no cardiac source of emboli. Hemoglobin A1c is pending. Patient has been seen in consultation by neurology recommending a TEE to rule out embolic source. 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 Patient was seen on behalf of CT surgery by Dr. Harrington Challenger of cardiology and patient had MR cardiac 07/04/2014 which had a normal left ventricular size with moderate concentric hypertrophy EF of 62% with no wall motion abnormalities, normal right ventricular size and systolic function, normal by HO size, trivial mitral regurgitation, small mobile density measuring 13 x 8 mm attached to the lateral wall of the left H and located between left upper and left lower pulmonary veins. 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 Per neurology patient should be okay for surgery in 1-2 weeks. Per cardiology no need for loop recorder at this time. Dr. Roxy Manns of CT surgery has assessed the patient today and patient for surgery on Thursday. Continue full dose aspirin for secondary stroke prevention. Risk factor modification. PT/OT. Follow-up with neurology as outpatient in 2 months.  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 Could be likely etiology of patient's stroke. Patient was seen by Dr. Harrington Challenger per CT surgery regarding left atrial mass. Patient had cardiac  MRI  with preliminary findings per cardiology of a small mass along the lateral wall of the left atrium concern for possible fibroblastoma with final interpretation/evaluation pending. Per neurology patient is okay for surgery from a neuro standpoint in 1-2 weeks. Patient to be continued on aspirin. Patient s/p cardiac catheterization 07/04/14 which showed severe two-vessel coronary artery disease. Patient has been seen by CT surgery, and patient for surgery Thursday.  #3 severe two-vessel coronary artery disease Per preoperative cardiac catheterization 07/04/2014. Patient noted to have severe two-vessel coronary artery disease including the ostial LAD and mid RCA. Per cardiology at time of surgery, patient will benefit from a LIMA to the LAD and recommending keeping patient in the hospital until his surgery. CT surgery assessed patient for surgery Thursday.   #4 right ACA aneurysm Noted on MRA of the head which was 4.4 mm. Patient currently asymptomatic. Risk of rupture less than 1%.. Patient with family history of ruptured aneurysms in the past. Risk factor modification. Blood pressure management. Outpatient follow-up with neurosurgery, Dr [name], for further evaluation and recommendations.  #5 hypertension Stable. Continue coreg and lisinopril. Goal blood pressure systolic < 413.  #6 hyperlipidemia LDL of 112. Goal LDL less than 70. Continue low-dose statin. Outpatient follow-up.  #7 tobacco abuse Tobacco cessation. Nicotine gum.  #8 abnormal chest x-ray CT Chest neg. Patient currently asymptomatic.  #9 Pre diabetes Diet and lifestyle modification  #10 prophylaxis Lovenox for DVT prophylaxis.    Code Status: Full Family Communication: Updated patient, no family at bedside. Disposition Plan: Remain inpatient.   Consultants:  Neurology: Dr. Nicole Kindred 07/01/2014  Cardiology: Dr  Marlou Porch 07/02/14, Dr Harrington Challenger 07/03/14  CT Surgery: Dr Roxy Manns 07/08/14  Procedures:  2-D echo  07/02/2014  Carotid Dopplers 07/02/2014  Chest x-ray 07/01/2014  MRI/MRA head 07/01/2014   MR Cardiac 07/04/14  Cardiac catheterization Dr. Scarlette Calico 07/04/14--- severe 2 vessel coronary artery disease including ostial LAD and mid RCA.  CT chest 07/07/14  Antibiotics:  None  HPI/Subjective: Patient states numbness and tingling has improved significantly and close to her baseline. No CP, No SOB. No complaints.  Objective: Filed Vitals:   07/08/14 0557  BP: 124/58  Pulse: 72  Temp: 97.8 F (36.6 C)  Resp:     Intake/Output Summary (Last 24 hours) at 07/08/14 1047 Last data filed at 07/08/14 1000  Gross per 24 hour  Intake    720 ml  Output      0 ml  Net    720 ml   Filed Weights   07/06/14 0543 07/07/14 0507 07/08/14 0557  Weight: 85.73 kg (189 lb) 86.274 kg (190 lb 3.2 oz) 86.682 kg (191 lb 1.6 oz)    Exam:   General:  NAD  Cardiovascular: RRR  Respiratory: CTAB  Abdomen: Soft/NT/ND/+BS  Musculoskeletal: No c/c/e  Data Reviewed: Basic Metabolic Panel:  Recent Labs Lab 07/04/14 0550 07/05/14 0252 07/06/14 0350 07/07/14 0432 07/08/14 0401  NA 139 139 137 139 141  K 3.8 3.7 3.7 3.9 3.9  CL 104 105 102 104 104  CO2 27 26 29 28 28   GLUCOSE 108* 102* 113* 107* 94  BUN 11 14 19 14 11   CREATININE 0.63 0.61 0.68 0.73 0.71  CALCIUM 9.1 9.3 9.3 9.1 9.4   Liver Function Tests:  Recent Labs Lab 07/05/14 0252 07/08/14 0401  AST 18 17  ALT 22 22  ALKPHOS 75 68  BILITOT 0.9 0.8  PROT 6.7 6.5  ALBUMIN 3.5 3.4*   No results for input(s): LIPASE, AMYLASE in the last 168 hours. No results for input(s): AMMONIA in the last 168 hours. CBC:  Recent Labs Lab 07/04/14 0550 07/05/14 0252 07/06/14 0350 07/07/14 0432 07/08/14 0401  WBC 9.5 11.7* 9.7 9.7 9.8  HGB 15.2* 15.6* 14.6 14.1 14.1  HCT 44.7 45.8 42.9 42.0 42.7  MCV 91.6 91.6 91.9 91.5 93.0  PLT 313 311 297 281 315   Cardiac Enzymes: No results for input(s): CKTOTAL, CKMB, CKMBINDEX,  TROPONINI in the last 168 hours. BNP (last 3 results) No results for input(s): BNP in the last 8760 hours.  ProBNP (last 3 results) No results for input(s): PROBNP in the last 8760 hours.  CBG:  Recent Labs Lab 07/01/14 2225 07/02/14 0645  GLUCAP 178* 106*    No results found for this or any previous visit (from the past 240 hour(s)).   Studies: Ct Angio Chest Pe W/cm &/or Wo Cm  07/07/2014   CLINICAL DATA:  Interstitial and nodular opacity seen on recent chest x-ray 07/01/2014.  EXAM: CT ANGIOGRAPHY CHEST WITH CONTRAST  TECHNIQUE: Multidetector CT imaging of the chest was performed using the standard protocol during bolus administration of intravenous contrast. Multiplanar CT image reconstructions and MIPs were obtained to evaluate the vascular anatomy.  CONTRAST:  100 mL Omnipaque 350  COMPARISON:  None.  FINDINGS: There is adequate opacification of the pulmonary arteries. There is no pulmonary embolus. Mild dilatation of the central pulmonary arteries as can be seen with pulmonary arterial hypertension. The heart size is normal. There is no pericardial effusion. There is coronary artery atherosclerosis in the left main, circumflex and RCA. There is thoracic aortic atherosclerosis.  There is a small linear band of airspace disease at the left lung base peripherally which  may reflect an area of fibrosis versus atelectasis. The lungs are otherwise clear. There is no focal consolidation, pleural effusion or pneumothorax.  There is no axillary, hilar, or mediastinal adenopathy.  There is no lytic or blastic osseous lesion.  The visualized portions of the upper abdomen are unremarkable.  Review of the MIP images confirms the above findings.  IMPRESSION: 1. No acute cardiopulmonary disease. 2. No pulmonary mass. 3. Coronary artery disease.   Electronically Signed   By: Kathreen Devoid   On: 07/07/2014 12:48    Scheduled Meds: . aspirin  324 mg Oral Daily  . atorvastatin  80 mg Oral q1800  .  carvedilol  3.125 mg Oral BID WC  . enoxaparin (LOVENOX) injection  40 mg Subcutaneous Q24H  . fenofibrate  160 mg Oral Daily  . lisinopril  5 mg Oral QHS  . loratadine  10 mg Oral Daily  . oxybutynin  10 mg Oral QHS  . 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 [facility] Pager [phone]. If 7PM-7AM, please contact night-coverage at [email or web address], password [email or web address] 07/08/2014, 10:47 AM  LOS: 7 days

## 2014-07-09 ENCOUNTER — Inpatient Hospital Stay (HOSPITAL_COMMUNITY): Payer: BC Managed Care – PPO

## 2014-07-09 LAB — COMPREHENSIVE METABOLIC PANEL
ALBUMIN: 3.2 g/dL — AB (ref 3.5–5.2)
ALT: 19 U/L (ref 0–35)
AST: 17 U/L (ref 0–37)
Alkaline Phosphatase: 73 U/L (ref 39–117)
Anion gap: 6 (ref 5–15)
BUN: 10 mg/dL (ref 6–23)
CALCIUM: 9.3 mg/dL (ref 8.4–10.5)
CO2: 28 mmol/L (ref 19–32)
CREATININE: 0.69 mg/dL (ref 0.50–1.10)
Chloride: 107 mmol/L (ref 96–112)
GFR calc Af Amer: >90 mL/min (ref >90)
GFR calc non Af Amer: >90 mL/min (ref >90)
GLUCOSE: 97 mg/dL (ref 70–99)
Potassium: 4.1 mmol/L (ref 3.5–5.1)
SODIUM: 141 mmol/L (ref 135–145)
TOTAL PROTEIN: 6.1 g/dL (ref 6.0–8.3)
Total Bilirubin: 0.6 mg/dL (ref 0.3–1.2)

## 2014-07-09 LAB — ABO/RH: ABO/RH(D): O POS

## 2014-07-09 LAB — TYPE AND SCREEN
ABO/RH(D): O POS
ANTIBODY SCREEN: NEGATIVE

## 2014-07-09 LAB — HEMOGLOBIN A1C
HEMOGLOBIN A1C: 6.1 % — AB (ref 4.8–5.6)
Mean Plasma Glucose: 128 mg/dL

## 2014-07-09 LAB — SURGICAL PCR SCREEN
MRSA, PCR: NEGATIVE
Staphylococcus aureus: NEGATIVE

## 2014-07-09 LAB — PREALBUMIN: PREALBUMIN: 21 mg/dL (ref 18.0–45.0)

## 2014-07-09 LAB — CBC
HEMATOCRIT: 41.3 % (ref 36.0–46.0)
HEMOGLOBIN: 13.9 g/dL (ref 12.0–15.0)
MCH: 31.2 pg (ref 26.0–34.0)
MCHC: 33.7 g/dL (ref 30.0–36.0)
MCV: 92.6 fL (ref 78.0–100.0)
PLATELETS: 300 10*3/uL (ref 150–400)
RBC: 4.46 MIL/uL (ref 3.87–5.11)
RDW: 12.8 % (ref 11.5–15.5)
WBC: 9.6 10*3/uL (ref 4.0–10.5)

## 2014-07-09 MED ORDER — MAGNESIUM SULFATE 50 % IJ SOLN
40.0000 meq | INTRAMUSCULAR | Status: DC
  Filled 2014-07-09: qty 10

## 2014-07-09 MED ORDER — DEXTROSE 5 % IV SOLN
1.5000 g | INTRAVENOUS | Status: AC
  Administered 2014-07-10: .75 g via INTRAVENOUS
  Administered 2014-07-10: 1.5 g via INTRAVENOUS
  Filled 2014-07-09: qty 1.5

## 2014-07-09 MED ORDER — VANCOMYCIN HCL 1000 MG IV SOLR
INTRAVENOUS | Status: AC
  Administered 2014-07-10: 1000 mL
  Filled 2014-07-09: qty 1000

## 2014-07-09 MED ORDER — CHLORHEXIDINE GLUCONATE 4 % EX LIQD
60.0000 mL | Freq: Once | CUTANEOUS | Status: AC
  Administered 2014-07-10: 4 via TOPICAL

## 2014-07-09 MED ORDER — METOPROLOL TARTRATE 12.5 MG HALF TABLET
12.5000 mg | ORAL_TABLET | Freq: Once | ORAL | Status: AC
  Administered 2014-07-10: 12.5 mg via ORAL
  Filled 2014-07-09: qty 1

## 2014-07-09 MED ORDER — AMINOCAPROIC ACID 250 MG/ML IV SOLN
INTRAVENOUS | Status: AC
  Administered 2014-07-10: 69.8 mL/h via INTRAVENOUS
  Filled 2014-07-09: qty 40

## 2014-07-09 MED ORDER — DOPAMINE-DEXTROSE 3.2-5 MG/ML-% IV SOLN
0.0000 ug/kg/min | INTRAVENOUS | Status: DC
Start: 2014-07-10 — End: 2014-07-10
  Filled 2014-07-09: qty 250

## 2014-07-09 MED ORDER — DEXTROSE 5 % IV SOLN
750.0000 mg | INTRAVENOUS | Status: DC
  Filled 2014-07-09: qty 750

## 2014-07-09 MED ORDER — NITROGLYCERIN IN D5W 200-5 MCG/ML-% IV SOLN
2.0000 ug/min | INTRAVENOUS | Status: AC
  Administered 2014-07-10: 5 ug/min via INTRAVENOUS
  Filled 2014-07-09: qty 250

## 2014-07-09 MED ORDER — VANCOMYCIN HCL 10 G IV SOLR
1500.0000 mg | INTRAVENOUS | Status: AC
  Administered 2014-07-10: 1500 mg via INTRAVENOUS
  Filled 2014-07-09: qty 1500

## 2014-07-09 MED ORDER — INSULIN REGULAR HUMAN 100 UNIT/ML IJ SOLN
INTRAMUSCULAR | Status: AC
  Administered 2014-07-10: 1 [IU]/h via INTRAVENOUS
  Filled 2014-07-09: qty 2.5

## 2014-07-09 MED ORDER — BISACODYL 5 MG PO TBEC
5.0000 mg | DELAYED_RELEASE_TABLET | Freq: Once | ORAL | Status: AC
  Administered 2014-07-09: 5 mg via ORAL
  Filled 2014-07-09: qty 1

## 2014-07-09 MED ORDER — PHENYLEPHRINE HCL 10 MG/ML IJ SOLN
30.0000 ug/min | INTRAVENOUS | Status: AC
  Administered 2014-07-10: 10 ug/min via INTRAVENOUS
  Administered 2014-07-10: 25 ug/min via INTRAVENOUS
  Filled 2014-07-09: qty 2

## 2014-07-09 MED ORDER — EPINEPHRINE HCL 1 MG/ML IJ SOLN
0.0000 ug/min | INTRAMUSCULAR | Status: DC
  Filled 2014-07-09: qty 4

## 2014-07-09 MED ORDER — SODIUM CHLORIDE 0.9 % IV SOLN
INTRAVENOUS | Status: DC
  Filled 2014-07-09: qty 30

## 2014-07-09 MED ORDER — CHLORHEXIDINE GLUCONATE 4 % EX LIQD
60.0000 mL | Freq: Once | CUTANEOUS | Status: AC
  Administered 2014-07-09: 4 via TOPICAL
  Filled 2014-07-09: qty 60

## 2014-07-09 MED ORDER — TEMAZEPAM 15 MG PO CAPS: 15.0000 mg | ORAL_CAPSULE | Freq: Once | ORAL | Status: AC | PRN

## 2014-07-09 MED ORDER — DEXMEDETOMIDINE HCL IN NACL 400 MCG/100ML IV SOLN
0.1000 ug/kg/h | INTRAVENOUS | Status: AC
  Administered 2014-07-10: 0.2 ug/kg/h via INTRAVENOUS
  Filled 2014-07-09: qty 100

## 2014-07-09 MED ORDER — PAPAVERINE HCL 30 MG/ML IJ SOLN
INTRAMUSCULAR | Status: AC
  Administered 2014-07-10: 500 mL
  Filled 2014-07-09: qty 2.5

## 2014-07-09 MED ORDER — POTASSIUM CHLORIDE 2 MEQ/ML IV SOLN
80.0000 meq | INTRAVENOUS | Status: DC
  Filled 2014-07-09 (×2): qty 40

## 2014-07-09 NOTE — Progress Notes (Signed)
VASCULAR LAB PRELIMINARY  PRELIMINARY  PRELIMINARY  PRELIMINARY  Bilateral lower extremity venous duplex completed.    Preliminary report:  Bilateral:  No evidence of DVT, superficial thrombosis, or Baker's Cyst.   Dody Smartt, RVS 07/09/2014, 12:50 PM

## 2014-07-09 NOTE — Progress Notes (Signed)
[name]   For OR tomorrow All questions answered  Hannah Nielsen 07/09/2014

## 2014-07-09 NOTE — Progress Notes (Signed)
[facility] PROGRESS NOTE  Hannah Nielsen ZOX:[id number] DOB: 1950/10/25 DOA: 07/01/2014 PCP: [name], MD  64 y/o ? prior heavy Tob use since age 49, htn and otherwsie bland med h/o admit 3/22 R sided weakness and numbness/tingling L arm.  Found to have acute CVA-neuro consulted-full usual work-up CVa revealed no source/cryptogenic CVA-Cardiology consulted.  TEE recommended and done 3/24 as below with mass-confirmed by Cardiac MRI.  Underwent cath 3/25 with severe 2 vessel disease.  Neurology cleared patient for Surgery for resection "papillary fibro-elastoma" + CABG and PFO repair   Assessment/Plan:  #1 acute CVA: Nondominant right high parietal cortical infarct embolic secondary to unknown source. Patient states left hand numbness clinically improving and close to baseline. MRI MRA of the head with right high parietal cortical infarct.  Carotid Dopplers were done with no significant ICA stenosis.  2-D echo done with EF of 65-70%, no wall motion abnormalities, no cardiac source of emboli. Hemoglobin A1c is pending.  Patient has been seen in consultation by neurology recommending a TEE to rule out embolic source.  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  Patient was seen on behalf of CT surgery by Dr. 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  Per neurology patient should be okay for surgery in 1-2 weeks.  Per cardiology no need for loop recorder at this time.  Dr. Roxy Manns of CT surgery d/w patient= surgery scheduled 07/10/14 Thursday. Continue full dose aspirin for secondary stroke prevention.  Risk factor modification. PT/OT.  Follow-up with neurology as outpatient in 2 months.  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  Per neurology patient  is okay for surgery from a neuro standpoint in 1-2 weeks. Patient to be continued on aspirin.  Patient s/p cardiac catheterization 07/04/14 which showed severe two-vessel coronary artery disease Patient has been seen by CT surgery, and patient for surgery Thursday.  #3 severe two-vessel coronary artery disease Per preoperative cardiac catheterization 07/04/2014. Patient noted to have severe two-vessel coronary artery disease including the ostial LAD and mid RCA.  Per cardiology at time of surgery, patient will benefit from a LIMA to the LAD and recommending keeping patient in the hospital until his surgery. CT surgery assessed patient for surgery Thursday.  #4 right ACA aneurysm Noted on MRA of the head which was 4.4 mm. Patient currently asymptomatic. Risk of rupture less than 1%. Patient with family history of ruptured aneurysms in the past.  Risk factor modification.  Blood pressure management.  Outpatient follow-up with neurosurgery, Dr [name], for further evaluation and recommendations.  #5 hypertension Stable. Continue coreg and lisinopril. Goal blood pressure systolic < 811.  #6 hyperlipidemia LDL of 112. Goal LDL less than 70. Continue low-dose statin. Outpatient follow-up.  #7 tobacco abuse Tobacco cessation. Nicotine gum.  #8 abnormal chest x-ray CT Chest neg. Patient currently asymptomatic.  #9 Pre diabetes Diet and lifestyle modification  #10 prophylaxis Lovenox for DVT prophylaxis.    Code Status: Full Family Communication: Updated patient, no family at bedside. Disposition Plan: Remain inpatient.   Consultants:  Neurology: Dr. Nicole Kindred 07/01/2014  Cardiology: Dr  Marlou Porch 07/02/14, Dr Harrington Challenger 07/03/14  CT Surgery: Dr Roxy Manns 07/08/14  Procedures:  2-D echo 07/02/2014  Carotid Dopplers 07/02/2014  Chest x-ray 07/01/2014  MRI/MRA head 07/01/2014   MR Cardiac 07/04/14  Cardiac catheterization  Dr. Scarlette Calico 07/04/14--- severe 2 vessel coronary artery disease  including ostial LAD and mid RCA.  CT chest 07/07/14  Antibiotics:  None  HPI/Subjective:  Doing fair denies any spontaneous complaint No other issues Understands fully procedureand has had all q's answered by Dr. Roxy Manns Has no further Q's for me  Objective: Filed Vitals:   07/09/14 1307  BP: 116/70  Pulse: 76  Temp: 97.5 F (36.4 C)  Resp: 16    Intake/Output Summary (Last 24 hours) at 07/09/14 1755 Last data filed at 07/09/14 1300  Gross per 24 hour  Intake    600 ml  Output   1325 ml  Net   -725 ml   Filed Weights   07/07/14 0507 07/08/14 0557 07/09/14 0630  Weight: 86.274 kg (190 lb 3.2 oz) 86.682 kg (191 lb 1.6 oz) 86.773 kg (191 lb 4.8 oz)    Exam:   General:  NAD  Cardiovascular: RRR  Respiratory: CTAB  Abdomen: Soft/NT/ND/+BS  Musculoskeletal: No c/c/e  Data Reviewed: Basic Metabolic Panel:  Recent Labs Lab 07/05/14 0252 07/06/14 0350 07/07/14 0432 07/08/14 0401 07/09/14 0330  NA 139 137 139 141 141  K 3.7 3.7 3.9 3.9 4.1  CL 105 102 104 104 107  CO2 26 29 28 28 28   GLUCOSE 102* 113* 107* 94 97  BUN 14 19 14 11 10   CREATININE 0.61 0.68 0.73 0.71 0.69  CALCIUM 9.3 9.3 9.1 9.4 9.3   Liver Function Tests:  Recent Labs Lab 07/05/14 0252 07/08/14 0401 07/09/14 0330  AST 18 17 17   ALT 22 22 19   ALKPHOS 75 68 73  BILITOT 0.9 0.8 0.6  PROT 6.7 6.5 6.1  ALBUMIN 3.5 3.4* 3.2*   No results for input(s): LIPASE, AMYLASE in the last 168 hours. No results for input(s): AMMONIA in the last 168 hours. CBC:  Recent Labs Lab 07/05/14 0252 07/06/14 0350 07/07/14 0432 07/08/14 0401 07/09/14 0330  WBC 11.7* 9.7 9.7 9.8 9.6  HGB 15.6* 14.6 14.1 14.1 13.9  HCT 45.8 42.9 42.0 42.7 41.3  MCV 91.6 91.9 91.5 93.0 92.6  PLT 311 297 281 315 300   Cardiac Enzymes: No results for input(s): CKTOTAL, CKMB, CKMBINDEX, TROPONINI in the last 168 hours. BNP (last 3 results) No results for input(s): BNP in the last 8760 hours.  ProBNP (last 3  results) No results for input(s): PROBNP in the last 8760 hours.  CBG: No results for input(s): GLUCAP in the last 168 hours.  Recent Results (from the past 240 hour(s))  Urine culture     Status: None   Collection Time: 07/07/14 12:27 PM  Result Value Ref Range Status   Specimen Description URINE, CLEAN CATCH  Final   Special Requests NONE  Final   Colony Count   Final    30,000 COLONIES/ML Performed at [facility]    Culture   Final    Multiple bacterial morphotypes present, none predominant. Suggest appropriate recollection if clinically indicated. Performed at [facility]    Report Status 07/08/2014 FINAL  Final  Surgical pcr screen     Status: None   Collection Time: 07/09/14 12:19 PM  Result Value Ref Range Status   MRSA, PCR NEGATIVE NEGATIVE Final   Staphylococcus aureus NEGATIVE NEGATIVE Final    Comment:        The Xpert SA Assay (FDA approved for NASAL specimens in patients over 74 years of age), is one component of a comprehensive surveillance program.  Test performance has been  validated by [facility] for patients greater than or equal to 58 year old. It is not intended to diagnose infection nor to guide or monitor treatment.      Studies: Dg Chest 2 View  07/09/2014   CLINICAL DATA:  Preop CABG.  EXAM: CHEST  2 VIEW  COMPARISON:  07/01/2014  FINDINGS: Linear scarring in the left base. Right lung is clear. Heart is normal size. No effusions. No change since prior study.  IMPRESSION: Left basilar atelectasis or scarring, unchanged.   Electronically Signed   By: Rolm Baptise M.D.   On: 07/09/2014 13:02    Scheduled Meds: . [START ON 07/10/2014] aminocaproic acid (AMICAR) for OHS   Intravenous To OR  . aspirin  324 mg Oral Daily  . atorvastatin  80 mg Oral q1800  . carvedilol  3.125 mg Oral BID WC  . [START ON 07/10/2014] cefUROXime (ZINACEF)  IV  1.5 g Intravenous To OR  . [START ON 07/10/2014] cefUROXime (ZINACEF)  IV  750 mg Intravenous  To OR  . chlorhexidine  60 mL Topical Once   And  . [START ON 07/10/2014] chlorhexidine  60 mL Topical Once  . [START ON 07/10/2014] dexmedetomidine  0.1-0.7 mcg/kg/hr Intravenous To OR  . [START ON 07/10/2014] DOPamine  0-10 mcg/kg/min Intravenous To OR  . enoxaparin (LOVENOX) injection  40 mg Subcutaneous Q24H  . [START ON 07/10/2014] epinephrine  0-10 mcg/min Intravenous To OR  . fenofibrate  160 mg Oral Daily  . [START ON 07/10/2014] heparin-papaverine-plasmalyte irrigation   Irrigation To OR  . [START ON 07/10/2014] heparin 30,000 units/NS 1000 mL solution for CELLSAVER   Other To OR  . [START ON 07/10/2014] insulin (NOVOLIN-R) infusion   Intravenous To OR  . lisinopril  5 mg Oral QHS  . loratadine  10 mg Oral Daily  . [START ON 07/10/2014] magnesium sulfate  40 mEq Other To OR  . [START ON 07/10/2014] metoprolol tartrate  12.5 mg Oral Once  . [START ON 07/10/2014] nitroGLYCERIN  2-200 mcg/min Intravenous To OR  . oxybutynin  10 mg Oral QHS  . [START ON 07/10/2014] phenylephrine (NEO-SYNEPHRINE) Adult infusion  30-200 mcg/min Intravenous To OR  . [START ON 07/10/2014] potassium chloride  80 mEq Other To OR  . senna-docusate  1 tablet Oral QHS  . [START ON 07/10/2014] vancomycin 1000 mg in NS (1000 ml) irrigation for Dr. Roxy Manns case   Irrigation To OR  . [START ON 07/10/2014] vancomycin  1,500 mg Intravenous To OR   Continuous Infusions:    Principal Problem:   Stroke Active Problems:   Hypertension   Tobacco abuse   Abnormal CXR   Aneurysm of anterior cerebral artery: Right per MRI 07/01/14   Hyperlipidemia   Pre-diabetes   Atrial myxoma: Probable per TEE 07/03/14   Mass   CAD (coronary artery disease), native coronary artery: Severe 2 vessel dz per cardiac cath 07/04/14   Papillary fibroelastoma of heart   Left atrial mass   Coronary artery disease involving native coronary artery of native heart without angina pectoris    Time spent: 15 minutes    Verneita Griffes, MD [facility] (P) [phone]

## 2014-07-10 ENCOUNTER — Inpatient Hospital Stay (HOSPITAL_COMMUNITY): Payer: BC Managed Care – PPO

## 2014-07-10 ENCOUNTER — Encounter (HOSPITAL_COMMUNITY)
Admission: EM | Disposition: A | Discharge status: Home / Self Care | Payer: BC Managed Care – PPO | Source: Home / Self Care | Attending: Internal Medicine

## 2014-07-10 ENCOUNTER — Inpatient Hospital Stay (HOSPITAL_COMMUNITY): Payer: BC Managed Care – PPO | Admitting: Critical Care Medicine

## 2014-07-10 ENCOUNTER — Encounter (HOSPITAL_COMMUNITY): Payer: Self-pay | Admitting: Critical Care Medicine

## 2014-07-10 DIAGNOSIS — Z951 Presence of aortocoronary bypass graft: Secondary | ICD-10-CM

## 2014-07-10 DIAGNOSIS — I5189 Other ill-defined heart diseases: Secondary | ICD-10-CM

## 2014-07-10 DIAGNOSIS — Q211 Atrial septal defect: Secondary | ICD-10-CM

## 2014-07-10 DIAGNOSIS — I2510 Atherosclerotic heart disease of native coronary artery without angina pectoris: Secondary | ICD-10-CM

## 2014-07-10 HISTORY — PX: EXCISION OF ATRIAL MYXOMA: SHX5821

## 2014-07-10 HISTORY — PX: REPAIR OF PATENT FORAMEN OVALE: SHX6064

## 2014-07-10 HISTORY — DX: Presence of aortocoronary bypass graft: Z95.1

## 2014-07-10 HISTORY — PX: CORONARY ARTERY BYPASS GRAFT: SHX141

## 2014-07-10 HISTORY — PX: TEE WITHOUT CARDIOVERSION: SHX5443

## 2014-07-10 LAB — POCT I-STAT, CHEM 8
BUN: 11 mg/dL (ref 6–23)
BUN: 10 mg/dL (ref 6–23)
BUN: 10 mg/dL (ref 6–23)
BUN: 11 mg/dL (ref 6–23)
BUN: 12 mg/dL (ref 6–23)
CALCIUM ION: 1.04 mmol/L — AB (ref 1.13–1.30)
CALCIUM ION: 1.08 mmol/L — AB (ref 1.13–1.30)
CALCIUM ION: 1.15 mmol/L (ref 1.13–1.30)
CHLORIDE: 107 mmol/L (ref 96–112)
CREATININE: 0.5 mg/dL (ref 0.50–1.10)
CREATININE: 0.4 mg/dL — AB (ref 0.50–1.10)
CREATININE: 0.4 mg/dL — AB (ref 0.50–1.10)
CREATININE: 0.5 mg/dL (ref 0.50–1.10)
CREATININE: 0.5 mg/dL (ref 0.50–1.10)
Calcium, Ion: 1.26 mmol/L (ref 1.13–1.30)
Calcium, Ion: 1.08 mmol/L — ABNORMAL LOW (ref 1.13–1.30)
Chloride: 103 mmol/L (ref 96–112)
Chloride: 100 mmol/L (ref 96–112)
Chloride: 103 mmol/L (ref 96–112)
Chloride: 103 mmol/L (ref 96–112)
GLUCOSE: 105 mg/dL — AB (ref 70–99)
GLUCOSE: 137 mg/dL — AB (ref 70–99)
Glucose, Bld: 99 mg/dL (ref 70–99)
Glucose, Bld: 129 mg/dL — ABNORMAL HIGH (ref 70–99)
Glucose, Bld: 112 mg/dL — ABNORMAL HIGH (ref 70–99)
HCT: 38 % (ref 36.0–46.0)
HCT: 26 % — ABNORMAL LOW (ref 36.0–46.0)
HCT: 26 % — ABNORMAL LOW (ref 36.0–46.0)
HEMATOCRIT: 28 % — AB (ref 36.0–46.0)
HEMATOCRIT: 34 % — AB (ref 36.0–46.0)
HEMOGLOBIN: 8.8 g/dL — AB (ref 12.0–15.0)
HEMOGLOBIN: 8.8 g/dL — AB (ref 12.0–15.0)
HEMOGLOBIN: 11.6 g/dL — AB (ref 12.0–15.0)
Hemoglobin: 12.9 g/dL (ref 12.0–15.0)
Hemoglobin: 9.5 g/dL — ABNORMAL LOW (ref 12.0–15.0)
POTASSIUM: 4 mmol/L (ref 3.5–5.1)
POTASSIUM: 4.3 mmol/L (ref 3.5–5.1)
Potassium: 5.2 mmol/L — ABNORMAL HIGH (ref 3.5–5.1)
Potassium: 4.6 mmol/L (ref 3.5–5.1)
Potassium: 4.2 mmol/L (ref 3.5–5.1)
SODIUM: 141 mmol/L (ref 135–145)
SODIUM: 139 mmol/L (ref 135–145)
Sodium: 136 mmol/L (ref 135–145)
Sodium: 141 mmol/L (ref 135–145)
Sodium: 143 mmol/L (ref 135–145)
TCO2: 24 mmol/L (ref 0–100)
TCO2: 23 mmol/L (ref 0–100)
TCO2: 25 mmol/L (ref 0–100)
TCO2: 22 mmol/L (ref 0–100)
TCO2: 22 mmol/L (ref 0–100)

## 2014-07-10 LAB — POCT I-STAT 3, ART BLOOD GAS (G3+)
ACID-BASE DEFICIT: 3 mmol/L — AB (ref 0.0–2.0)
Acid-Base Excess: 1 mmol/L (ref 0.0–2.0)
Acid-base deficit: 1 mmol/L (ref 0.0–2.0)
Acid-base deficit: 3 mmol/L — ABNORMAL HIGH (ref 0.0–2.0)
Acid-base deficit: 3 mmol/L — ABNORMAL HIGH (ref 0.0–2.0)
BICARBONATE: 23.8 meq/L (ref 20.0–24.0)
Bicarbonate: 25.5 mEq/L — ABNORMAL HIGH (ref 20.0–24.0)
Bicarbonate: 24.7 mEq/L — ABNORMAL HIGH (ref 20.0–24.0)
Bicarbonate: 23 mEq/L (ref 20.0–24.0)
Bicarbonate: 23 mEq/L (ref 20.0–24.0)
O2 SAT: 100 %
O2 SAT: 94 %
O2 Saturation: 99 %
O2 Saturation: 97 %
O2 Saturation: 92 %
PCO2 ART: 46.3 mmHg — AB (ref 35.0–45.0)
PH ART: 7.316 — AB (ref 7.350–7.450)
PO2 ART: 376 mmHg — AB (ref 80.0–100.0)
PO2 ART: 76 mmHg — AB (ref 80.0–100.0)
Patient temperature: 36.4
Patient temperature: 36.5
TCO2: 27 mmol/L (ref 0–100)
TCO2: 26 mmol/L (ref 0–100)
TCO2: 25 mmol/L (ref 0–100)
TCO2: 24 mmol/L (ref 0–100)
TCO2: 24 mmol/L (ref 0–100)
pCO2 arterial: 40.9 mmHg (ref 35.0–45.0)
pCO2 arterial: 44.3 mmHg (ref 35.0–45.0)
pCO2 arterial: 44.8 mmHg (ref 35.0–45.0)
pCO2 arterial: 43.2 mmHg (ref 35.0–45.0)
pH, Arterial: 7.403 (ref 7.350–7.450)
pH, Arterial: 7.355 (ref 7.350–7.450)
pH, Arterial: 7.318 — ABNORMAL LOW (ref 7.350–7.450)
pH, Arterial: 7.332 — ABNORMAL LOW (ref 7.350–7.450)
pO2, Arterial: 173 mmHg — ABNORMAL HIGH (ref 80.0–100.0)
pO2, Arterial: 94 mmHg (ref 80.0–100.0)
pO2, Arterial: 69 mmHg — ABNORMAL LOW (ref 80.0–100.0)

## 2014-07-10 LAB — CBC
HCT: 41.7 % (ref 36.0–46.0)
HCT: 33.7 % — ABNORMAL LOW (ref 36.0–46.0)
HCT: 34.5 % — ABNORMAL LOW (ref 36.0–46.0)
HEMOGLOBIN: 14.1 g/dL (ref 12.0–15.0)
Hemoglobin: 11.3 g/dL — ABNORMAL LOW (ref 12.0–15.0)
Hemoglobin: 11.6 g/dL — ABNORMAL LOW (ref 12.0–15.0)
MCH: 31.4 pg (ref 26.0–34.0)
MCH: 31 pg (ref 26.0–34.0)
MCH: 30.9 pg (ref 26.0–34.0)
MCHC: 33.8 g/dL (ref 30.0–36.0)
MCHC: 33.5 g/dL (ref 30.0–36.0)
MCHC: 33.6 g/dL (ref 30.0–36.0)
MCV: 92.9 fL (ref 78.0–100.0)
MCV: 92.6 fL (ref 78.0–100.0)
MCV: 92 fL (ref 78.0–100.0)
Platelets: 303 10*3/uL (ref 150–400)
Platelets: 174 10*3/uL (ref 150–400)
Platelets: 222 10*3/uL (ref 150–400)
RBC: 4.49 MIL/uL (ref 3.87–5.11)
RBC: 3.64 MIL/uL — AB (ref 3.87–5.11)
RBC: 3.75 MIL/uL — ABNORMAL LOW (ref 3.87–5.11)
RDW: 12.9 % (ref 11.5–15.5)
RDW: 12.9 % (ref 11.5–15.5)
RDW: 13 % (ref 11.5–15.5)
WBC: 10.6 10*3/uL — ABNORMAL HIGH (ref 4.0–10.5)
WBC: 19.2 10*3/uL — AB (ref 4.0–10.5)
WBC: 20.8 10*3/uL — ABNORMAL HIGH (ref 4.0–10.5)

## 2014-07-10 LAB — HEMOGLOBIN AND HEMATOCRIT, BLOOD
HCT: 27.4 % — ABNORMAL LOW (ref 36.0–46.0)
Hemoglobin: 9.4 g/dL — ABNORMAL LOW (ref 12.0–15.0)

## 2014-07-10 LAB — PROTIME-INR
INR: 1 (ref 0.00–1.49)
INR: 1.34 (ref 0.00–1.49)
Prothrombin Time: 13.3 seconds (ref 11.6–15.2)
Prothrombin Time: 16.7 seconds — ABNORMAL HIGH (ref 11.6–15.2)

## 2014-07-10 LAB — POCT I-STAT 4, (NA,K, GLUC, HGB,HCT)
Glucose, Bld: 114 mg/dL — ABNORMAL HIGH (ref 70–99)
HEMATOCRIT: 33 % — AB (ref 36.0–46.0)
Hemoglobin: 11.2 g/dL — ABNORMAL LOW (ref 12.0–15.0)
POTASSIUM: 3.8 mmol/L (ref 3.5–5.1)
SODIUM: 143 mmol/L (ref 135–145)

## 2014-07-10 LAB — CREATININE, SERUM
CREATININE: 0.62 mg/dL (ref 0.50–1.10)
GFR calc Af Amer: >90 mL/min (ref >90)
GFR calc non Af Amer: >90 mL/min (ref >90)

## 2014-07-10 LAB — APTT
aPTT: 34 seconds (ref 24–37)
aPTT: 33 seconds (ref 24–37)

## 2014-07-10 LAB — GLUCOSE, CAPILLARY
GLUCOSE-CAPILLARY: 108 mg/dL — AB (ref 70–99)
GLUCOSE-CAPILLARY: 109 mg/dL — AB (ref 70–99)
Glucose-Capillary: 100 mg/dL — ABNORMAL HIGH (ref 70–99)
Glucose-Capillary: 105 mg/dL — ABNORMAL HIGH (ref 70–99)
Glucose-Capillary: 120 mg/dL — ABNORMAL HIGH (ref 70–99)
Glucose-Capillary: 117 mg/dL — ABNORMAL HIGH (ref 70–99)
Glucose-Capillary: 114 mg/dL — ABNORMAL HIGH (ref 70–99)
Glucose-Capillary: 127 mg/dL — ABNORMAL HIGH (ref 70–99)

## 2014-07-10 LAB — BASIC METABOLIC PANEL
ANION GAP: 10 (ref 5–15)
BUN: 10 mg/dL (ref 6–23)
CO2: 24 mmol/L (ref 19–32)
CREATININE: 0.73 mg/dL (ref 0.50–1.10)
Calcium: 9.2 mg/dL (ref 8.4–10.5)
Chloride: 106 mmol/L (ref 96–112)
GFR calc non Af Amer: 88 mL/min — ABNORMAL LOW (ref >90)
Glucose, Bld: 101 mg/dL — ABNORMAL HIGH (ref 70–99)
Potassium: 4.1 mmol/L (ref 3.5–5.1)
Sodium: 140 mmol/L (ref 135–145)

## 2014-07-10 LAB — MAGNESIUM: MAGNESIUM: 3.5 mg/dL — AB (ref 1.5–2.5)

## 2014-07-10 LAB — PLATELET COUNT: Platelets: 185 10*3/uL (ref 150–400)

## 2014-07-10 SURGERY — CORONARY ARTERY BYPASS GRAFTING (CABG)
Anesthesia: General | Site: Chest

## 2014-07-10 MED ORDER — MIDAZOLAM HCL 2 MG/2ML IJ SOLN
INTRAMUSCULAR | Status: AC
  Filled 2014-07-10: qty 2

## 2014-07-10 MED ORDER — BISACODYL 10 MG RE SUPP: 10.0000 mg | Freq: Every day | RECTAL | Status: DC

## 2014-07-10 MED ORDER — FENTANYL CITRATE 0.05 MG/ML IJ SOLN
INTRAMUSCULAR | Status: AC
  Filled 2014-07-10: qty 5

## 2014-07-10 MED ORDER — LIDOCAINE HCL (CARDIAC) 20 MG/ML IV SOLN
INTRAVENOUS | Status: AC
  Filled 2014-07-10: qty 5

## 2014-07-10 MED ORDER — PROTAMINE SULFATE 10 MG/ML IV SOLN
INTRAVENOUS | Status: DC | PRN
  Administered 2014-07-10: 30 mg via INTRAVENOUS
  Administered 2014-07-10: 20 mg via INTRAVENOUS
  Administered 2014-07-10: 200 mg via INTRAVENOUS

## 2014-07-10 MED ORDER — ARTIFICIAL TEARS OP OINT
TOPICAL_OINTMENT | OPHTHALMIC | Status: DC | PRN
  Administered 2014-07-10: 1 via OPHTHALMIC

## 2014-07-10 MED ORDER — NITROGLYCERIN IN D5W 200-5 MCG/ML-% IV SOLN: 0.0000 ug/min | INTRAVENOUS | Status: DC

## 2014-07-10 MED ORDER — PROPOFOL 10 MG/ML IV BOLUS
INTRAVENOUS | Status: DC | PRN
  Administered 2014-07-10 (×2): 50 mg via INTRAVENOUS

## 2014-07-10 MED ORDER — FENTANYL CITRATE 0.05 MG/ML IJ SOLN
INTRAMUSCULAR | Status: DC | PRN
  Administered 2014-07-10: 200 ug via INTRAVENOUS
  Administered 2014-07-10 (×2): 50 ug via INTRAVENOUS
  Administered 2014-07-10: 150 ug via INTRAVENOUS
  Administered 2014-07-10: 100 ug via INTRAVENOUS
  Administered 2014-07-10: 150 ug via INTRAVENOUS
  Administered 2014-07-10: 100 ug via INTRAVENOUS
  Administered 2014-07-10: 150 ug via INTRAVENOUS
  Administered 2014-07-10 (×3): 100 ug via INTRAVENOUS
  Administered 2014-07-10: 250 ug via INTRAVENOUS
  Administered 2014-07-10: 100 ug via INTRAVENOUS

## 2014-07-10 MED ORDER — SODIUM BICARBONATE 8.4 % IV SOLN
50.0000 meq | Freq: Once | INTRAVENOUS | Status: AC
  Administered 2014-07-10: 50 meq via INTRAVENOUS

## 2014-07-10 MED ORDER — SODIUM CHLORIDE 0.9 % IV SOLN
250.0000 mL | INTRAVENOUS | Status: DC
  Administered 2014-07-11: 250 mL via INTRAVENOUS

## 2014-07-10 MED ORDER — STERILE WATER FOR INJECTION IJ SOLN
INTRAMUSCULAR | Status: AC
  Filled 2014-07-10: qty 10

## 2014-07-10 MED ORDER — DOCUSATE SODIUM 100 MG PO CAPS
200.0000 mg | ORAL_CAPSULE | Freq: Every day | ORAL | Status: DC
  Administered 2014-07-11 – 2014-07-14 (×4): 200 mg via ORAL
  Filled 2014-07-10 (×5): qty 2

## 2014-07-10 MED ORDER — ASPIRIN EC 325 MG PO TBEC
325.0000 mg | DELAYED_RELEASE_TABLET | Freq: Every day | ORAL | Status: DC
  Administered 2014-07-11 – 2014-07-14 (×4): 325 mg via ORAL
  Filled 2014-07-10 (×4): qty 1

## 2014-07-10 MED ORDER — VECURONIUM BROMIDE 10 MG IV SOLR
INTRAVENOUS | Status: AC
Start: 2014-07-10 — End: 2014-07-10
  Filled 2014-07-10: qty 10

## 2014-07-10 MED ORDER — MAGNESIUM SULFATE 4 GM/100ML IV SOLN
4.0000 g | Freq: Once | INTRAVENOUS | Status: AC
  Administered 2014-07-10: 4 g via INTRAVENOUS
  Filled 2014-07-10: qty 100

## 2014-07-10 MED ORDER — MORPHINE SULFATE 2 MG/ML IJ SOLN
2.0000 mg | INTRAMUSCULAR | Status: DC | PRN
  Administered 2014-07-10 – 2014-07-11 (×4): 2 mg via INTRAVENOUS
  Filled 2014-07-10 (×4): qty 1

## 2014-07-10 MED ORDER — LACTATED RINGERS IV SOLN
INTRAVENOUS | Status: DC | PRN
  Administered 2014-07-10: 07:00:00 via INTRAVENOUS

## 2014-07-10 MED ORDER — VECURONIUM BROMIDE 10 MG IV SOLR
INTRAVENOUS | Status: AC
  Filled 2014-07-10: qty 10

## 2014-07-10 MED ORDER — SODIUM CHLORIDE 0.9 % IJ SOLN
OROMUCOSAL | Status: DC | PRN
  Administered 2014-07-10 (×4): 4 mL via TOPICAL

## 2014-07-10 MED ORDER — METOPROLOL TARTRATE 25 MG/10 ML ORAL SUSPENSION
12.5000 mg | Freq: Two times a day (BID) | ORAL | Status: DC
  Filled 2014-07-10 (×3): qty 5

## 2014-07-10 MED ORDER — LACTATED RINGERS IV SOLN: 500.0000 mL | Freq: Once | INTRAVENOUS | Status: AC | PRN

## 2014-07-10 MED ORDER — BISACODYL 5 MG PO TBEC
10.0000 mg | DELAYED_RELEASE_TABLET | Freq: Every day | ORAL | Status: DC
  Administered 2014-07-11 – 2014-07-13 (×3): 10 mg via ORAL
  Filled 2014-07-10 (×3): qty 2

## 2014-07-10 MED ORDER — ALBUMIN HUMAN 5 % IV SOLN
INTRAVENOUS | Status: DC | PRN
Start: 2014-07-10 — End: 2014-07-10
  Administered 2014-07-10 (×2): via INTRAVENOUS

## 2014-07-10 MED ORDER — LACTATED RINGERS IV SOLN: INTRAVENOUS | Status: DC

## 2014-07-10 MED ORDER — ACETAMINOPHEN 160 MG/5ML PO SOLN: 1000.0000 mg | Freq: Four times a day (QID) | ORAL | Status: DC

## 2014-07-10 MED ORDER — SODIUM CHLORIDE 0.9 % IV SOLN
INTRAVENOUS | Status: DC
  Administered 2014-07-10: 14:00:00 via INTRAVENOUS

## 2014-07-10 MED ORDER — FENTANYL CITRATE 0.05 MG/ML IJ SOLN
INTRAMUSCULAR | Status: AC
Start: 2014-07-10 — End: 2014-07-10
  Filled 2014-07-10: qty 2

## 2014-07-10 MED ORDER — MIDAZOLAM HCL 10 MG/2ML IJ SOLN
INTRAMUSCULAR | Status: AC
  Filled 2014-07-10: qty 2

## 2014-07-10 MED ORDER — ARTIFICIAL TEARS OP OINT
TOPICAL_OINTMENT | OPHTHALMIC | Status: AC
  Filled 2014-07-10: qty 3.5

## 2014-07-10 MED ORDER — ONDANSETRON HCL 4 MG/2ML IJ SOLN: 4.0000 mg | Freq: Four times a day (QID) | INTRAMUSCULAR | Status: DC | PRN

## 2014-07-10 MED ORDER — CETYLPYRIDINIUM CHLORIDE 0.05 % MT LIQD
7.0000 mL | Freq: Two times a day (BID) | OROMUCOSAL | Status: DC
  Administered 2014-07-11 – 2014-07-13 (×5): 7 mL via OROMUCOSAL

## 2014-07-10 MED ORDER — ALBUMIN HUMAN 5 % IV SOLN: 250.0000 mL | INTRAVENOUS | Status: AC | PRN

## 2014-07-10 MED ORDER — SODIUM CHLORIDE 0.9 % IV SOLN
INTRAVENOUS | Status: AC
  Administered 2014-07-10: 14:00:00 via INTRAVENOUS

## 2014-07-10 MED ORDER — PROPOFOL 10 MG/ML IV BOLUS
INTRAVENOUS | Status: AC
  Filled 2014-07-10: qty 20

## 2014-07-10 MED ORDER — ROCURONIUM BROMIDE 100 MG/10ML IV SOLN
INTRAVENOUS | Status: DC | PRN
  Administered 2014-07-10: 50 mg via INTRAVENOUS

## 2014-07-10 MED ORDER — DEXMEDETOMIDINE HCL IN NACL 200 MCG/50ML IV SOLN
0.0000 ug/kg/h | INTRAVENOUS | Status: DC
  Filled 2014-07-10: qty 50

## 2014-07-10 MED ORDER — SODIUM CHLORIDE 0.9 % IJ SOLN
INTRAMUSCULAR | Status: AC
  Filled 2014-07-10: qty 10

## 2014-07-10 MED ORDER — TRAMADOL HCL 50 MG PO TABS
50.0000 mg | ORAL_TABLET | ORAL | Status: DC | PRN
  Administered 2014-07-10 – 2014-07-14 (×7): 100 mg via ORAL
  Filled 2014-07-10 (×7): qty 2

## 2014-07-10 MED ORDER — ACETAMINOPHEN 500 MG PO TABS
1000.0000 mg | ORAL_TABLET | Freq: Four times a day (QID) | ORAL | Status: DC
  Administered 2014-07-10 – 2014-07-13 (×8): 1000 mg via ORAL
  Filled 2014-07-10 (×16): qty 2

## 2014-07-10 MED ORDER — CHLORHEXIDINE GLUCONATE 0.12 % MT SOLN
15.0000 mL | Freq: Two times a day (BID) | OROMUCOSAL | Status: DC
  Administered 2014-07-10 – 2014-07-13 (×5): 15 mL via OROMUCOSAL
  Filled 2014-07-10 (×10): qty 15

## 2014-07-10 MED ORDER — OXYCODONE HCL 5 MG PO TABS
5.0000 mg | ORAL_TABLET | ORAL | Status: DC | PRN
  Administered 2014-07-10 – 2014-07-11 (×4): 10 mg via ORAL
  Administered 2014-07-11 (×2): 5 mg via ORAL
  Administered 2014-07-11 – 2014-07-13 (×6): 10 mg via ORAL
  Filled 2014-07-10 (×11): qty 2

## 2014-07-10 MED ORDER — SODIUM CHLORIDE 0.9 % IV SOLN
INTRAVENOUS | Status: DC
  Filled 2014-07-10: qty 2.5

## 2014-07-10 MED ORDER — FAMOTIDINE IN NACL 20-0.9 MG/50ML-% IV SOLN
20.0000 mg | Freq: Two times a day (BID) | INTRAVENOUS | Status: DC
  Administered 2014-07-10: 20 mg via INTRAVENOUS

## 2014-07-10 MED ORDER — VANCOMYCIN HCL IN DEXTROSE 1-5 GM/200ML-% IV SOLN
1000.0000 mg | Freq: Once | INTRAVENOUS | Status: AC
Start: 2014-07-10 — End: 2014-07-10
  Administered 2014-07-10: 1000 mg via INTRAVENOUS
  Filled 2014-07-10: qty 200

## 2014-07-10 MED ORDER — GLYCOPYRROLATE 0.2 MG/ML IJ SOLN
INTRAMUSCULAR | Status: DC | PRN
  Administered 2014-07-10: 0.2 mg via INTRAVENOUS

## 2014-07-10 MED ORDER — INSULIN REGULAR BOLUS VIA INFUSION
0.0000 [IU] | Freq: Three times a day (TID) | INTRAVENOUS | Status: DC
  Filled 2014-07-10: qty 10

## 2014-07-10 MED ORDER — METOPROLOL TARTRATE 1 MG/ML IV SOLN: 2.5000 mg | INTRAVENOUS | Status: DC | PRN

## 2014-07-10 MED ORDER — POTASSIUM CHLORIDE 10 MEQ/50ML IV SOLN
10.0000 meq | INTRAVENOUS | Status: AC
  Administered 2014-07-10 (×3): 10 meq via INTRAVENOUS

## 2014-07-10 MED ORDER — GLUTARALDEHYDE 0.625% SOAKING SOLUTION
TOPICAL | Status: AC
  Administered 2014-07-10: 1 via TOPICAL
  Filled 2014-07-10: qty 50

## 2014-07-10 MED ORDER — HEPARIN SODIUM (PORCINE) 1000 UNIT/ML IJ SOLN
INTRAMUSCULAR | Status: AC
  Filled 2014-07-10: qty 1

## 2014-07-10 MED ORDER — DEXTROSE 5 % IV SOLN
1.5000 g | Freq: Two times a day (BID) | INTRAVENOUS | Status: AC
  Administered 2014-07-10 – 2014-07-12 (×4): 1.5 g via INTRAVENOUS
  Filled 2014-07-10 (×4): qty 1.5

## 2014-07-10 MED ORDER — ACETAMINOPHEN 650 MG RE SUPP
650.0000 mg | Freq: Once | RECTAL | Status: AC
  Administered 2014-07-10: 650 mg via RECTAL

## 2014-07-10 MED ORDER — PANTOPRAZOLE SODIUM 40 MG PO TBEC
40.0000 mg | DELAYED_RELEASE_TABLET | Freq: Every day | ORAL | Status: DC
  Administered 2014-07-12 – 2014-07-14 (×3): 40 mg via ORAL
  Filled 2014-07-10 (×3): qty 1

## 2014-07-10 MED ORDER — SUCCINYLCHOLINE CHLORIDE 20 MG/ML IJ SOLN
INTRAMUSCULAR | Status: AC
  Filled 2014-07-10: qty 1

## 2014-07-10 MED ORDER — HEPARIN SODIUM (PORCINE) 1000 UNIT/ML IJ SOLN
INTRAMUSCULAR | Status: DC | PRN
  Administered 2014-07-10: 3000 [IU] via INTRAVENOUS
  Administered 2014-07-10: 22000 [IU] via INTRAVENOUS

## 2014-07-10 MED ORDER — MIDAZOLAM HCL 2 MG/2ML IJ SOLN: 2.0000 mg | INTRAMUSCULAR | Status: DC | PRN

## 2014-07-10 MED ORDER — 0.9 % SODIUM CHLORIDE (POUR BTL) OPTIME
TOPICAL | Status: DC | PRN
  Administered 2014-07-10: 6000 mL

## 2014-07-10 MED ORDER — ACETAMINOPHEN 160 MG/5ML PO SOLN: 650.0000 mg | Freq: Once | ORAL | Status: AC

## 2014-07-10 MED ORDER — SODIUM CHLORIDE 0.9 % IJ SOLN
3.0000 mL | Freq: Two times a day (BID) | INTRAMUSCULAR | Status: DC
  Administered 2014-07-11: 3 mL via INTRAVENOUS

## 2014-07-10 MED ORDER — SODIUM CHLORIDE 0.9 % IJ SOLN: 3.0000 mL | INTRAMUSCULAR | Status: DC | PRN

## 2014-07-10 MED ORDER — SODIUM CHLORIDE 0.9 % IV SOLN
INTRAVENOUS | Status: DC | PRN
  Administered 2014-07-10: 11:00:00 via INTRAVENOUS

## 2014-07-10 MED ORDER — DEXTROSE 5 % IV SOLN
0.0000 ug/min | INTRAVENOUS | Status: DC
  Filled 2014-07-10 (×2): qty 2

## 2014-07-10 MED ORDER — MIDAZOLAM HCL 5 MG/5ML IJ SOLN
INTRAMUSCULAR | Status: DC | PRN
  Administered 2014-07-10: 2 mg via INTRAVENOUS
  Administered 2014-07-10: 3 mg via INTRAVENOUS
  Administered 2014-07-10: 2 mg via INTRAVENOUS
  Administered 2014-07-10: 1 mg via INTRAVENOUS
  Administered 2014-07-10: 4 mg via INTRAVENOUS

## 2014-07-10 MED ORDER — MORPHINE SULFATE 2 MG/ML IJ SOLN: 1.0000 mg | INTRAMUSCULAR | Status: DC | PRN

## 2014-07-10 MED ORDER — PROTAMINE SULFATE 10 MG/ML IV SOLN
INTRAVENOUS | Status: AC
  Filled 2014-07-10: qty 25

## 2014-07-10 MED ORDER — ROCURONIUM BROMIDE 50 MG/5ML IV SOLN
INTRAVENOUS | Status: AC
  Filled 2014-07-10: qty 1

## 2014-07-10 MED ORDER — STERILE WATER FOR INJECTION IJ SOLN
INTRAMUSCULAR | Status: AC
Start: 2014-07-10 — End: 2014-07-10
  Filled 2014-07-10: qty 10

## 2014-07-10 MED ORDER — EPHEDRINE SULFATE 50 MG/ML IJ SOLN
INTRAMUSCULAR | Status: AC
  Filled 2014-07-10: qty 1

## 2014-07-10 MED ORDER — ASPIRIN 81 MG PO CHEW: 324.0000 mg | CHEWABLE_TABLET | Freq: Every day | ORAL | Status: DC

## 2014-07-10 MED ORDER — SODIUM CHLORIDE 0.45 % IV SOLN
INTRAVENOUS | Status: DC | PRN
  Administered 2014-07-10: 17:00:00 via INTRAVENOUS

## 2014-07-10 MED ORDER — PHENYLEPHRINE HCL 10 MG/ML IJ SOLN
INTRAMUSCULAR | Status: DC | PRN
  Administered 2014-07-10 (×4): 40 ug via INTRAVENOUS

## 2014-07-10 MED ORDER — METOPROLOL TARTRATE 12.5 MG HALF TABLET
12.5000 mg | ORAL_TABLET | Freq: Two times a day (BID) | ORAL | Status: DC
  Administered 2014-07-11 – 2014-07-14 (×6): 12.5 mg via ORAL
  Filled 2014-07-10 (×11): qty 1

## 2014-07-10 MED ORDER — VECURONIUM BROMIDE 10 MG IV SOLR
INTRAVENOUS | Status: DC | PRN
  Administered 2014-07-10: 10 mg via INTRAVENOUS
  Administered 2014-07-10: 5 mg via INTRAVENOUS
  Administered 2014-07-10: 10 mg via INTRAVENOUS
  Administered 2014-07-10: 5 mg via INTRAVENOUS

## 2014-07-10 MED FILL — Lidocaine HCl IV Inj 20 MG/ML: INTRAVENOUS | Qty: 5 | Status: AC

## 2014-07-10 MED FILL — Electrolyte-R (PH 7.4) Solution: INTRAVENOUS | Qty: 4000 | Status: AC

## 2014-07-10 MED FILL — Heparin Sodium (Porcine) Inj 1000 Unit/ML: INTRAMUSCULAR | Qty: 10 | Status: AC

## 2014-07-10 MED FILL — Sodium Bicarbonate IV Soln 8.4%: INTRAVENOUS | Qty: 50 | Status: AC

## 2014-07-10 MED FILL — Sodium Chloride IV Soln 0.9%: INTRAVENOUS | Qty: 2000 | Status: AC

## 2014-07-10 MED FILL — Mannitol IV Soln 20%: INTRAVENOUS | Qty: 500 | Status: AC

## 2014-07-10 SURGICAL SUPPLY — 102 items
ADH SKN CLS APL DERMABOND .7 (GAUZE/BANDAGES/DRESSINGS) ×2
BAG DECANTER FOR FLEXI CONT (MISCELLANEOUS) ×6 IMPLANT
BANDAGE ELASTIC 4 VELCRO ST LF (GAUZE/BANDAGES/DRESSINGS) ×3 IMPLANT
BANDAGE ELASTIC 6 VELCRO ST LF (GAUZE/BANDAGES/DRESSINGS) ×3 IMPLANT
BASKET HEART (ORDER IN 25'S) (MISCELLANEOUS) ×1
BASKET HEART (ORDER IN 25S) (MISCELLANEOUS) ×2 IMPLANT
BLADE STERNUM SYSTEM 6 (BLADE) ×3 IMPLANT
BLADE SURG 11 STRL SS (BLADE) ×1 IMPLANT
BLADE SURG ROTATE 9660 (MISCELLANEOUS) IMPLANT
BNDG GAUZE ELAST 4 BULKY (GAUZE/BANDAGES/DRESSINGS) ×3 IMPLANT
CANISTER SUCTION 2500CC (MISCELLANEOUS) ×3 IMPLANT
CANNULA EZ GLIDE AORTIC 21FR (CANNULA) ×6 IMPLANT
CATH CPB KIT OWEN (MISCELLANEOUS) ×3 IMPLANT
CATH THORACIC 36FR (CATHETERS) ×3 IMPLANT
CLIP RETRACTION 3.0MM CORONARY (MISCELLANEOUS) ×1 IMPLANT
CLIP TI MEDIUM 24 (CLIP) IMPLANT
CLIP TI WIDE RED SMALL 24 (CLIP) IMPLANT
CONN ST 1/4X3/8  BEN (MISCELLANEOUS) ×2
CONN ST 1/4X3/8 BEN (MISCELLANEOUS) IMPLANT
CONT SPEC 4OZ CLIKSEAL STRL BL (MISCELLANEOUS) ×2 IMPLANT
COVER PROBE W GEL 5X96 (DRAPES) ×1 IMPLANT
CRADLE DONUT ADULT HEAD (MISCELLANEOUS) ×3 IMPLANT
DERMABOND ADVANCED (GAUZE/BANDAGES/DRESSINGS) ×1
DERMABOND ADVANCED .7 DNX12 (GAUZE/BANDAGES/DRESSINGS) IMPLANT
DRAIN CHANNEL 32F RND 10.7 FF (WOUND CARE) ×6 IMPLANT
DRAPE CARDIOVASCULAR INCISE (DRAPES) ×3
DRAPE INCISE IOBAN 66X45 STRL (DRAPES) ×4 IMPLANT
DRAPE SLUSH/WARMER DISC (DRAPES) ×3 IMPLANT
DRAPE SRG 135X102X78XABS (DRAPES) ×2 IMPLANT
DRSG AQUACEL AG ADV 3.5X14 (GAUZE/BANDAGES/DRESSINGS) ×1 IMPLANT
DRSG COVADERM 4X14 (GAUZE/BANDAGES/DRESSINGS) ×3 IMPLANT
ELECT BLADE 4.0 EZ CLEAN MEGAD (MISCELLANEOUS) ×3
ELECT REM PT RETURN 9FT ADLT (ELECTROSURGICAL) ×6
ELECTRODE BLDE 4.0 EZ CLN MEGD (MISCELLANEOUS) IMPLANT
ELECTRODE REM PT RTRN 9FT ADLT (ELECTROSURGICAL) ×4 IMPLANT
GAUZE SPONGE 4X4 12PLY STRL (GAUZE/BANDAGES/DRESSINGS) ×6 IMPLANT
GLOVE ORTHO TXT STRL SZ7.5 (GLOVE) ×6 IMPLANT
GOWN STRL REUS W/ TWL LRG LVL3 (GOWN DISPOSABLE) ×8 IMPLANT
GOWN STRL REUS W/TWL LRG LVL3 (GOWN DISPOSABLE) ×24
HEMOSTAT POWDER SURGIFOAM 1G (HEMOSTASIS) ×10 IMPLANT
INSERT FOGARTY XLG (MISCELLANEOUS) ×3 IMPLANT
KIT BASIN OR (CUSTOM PROCEDURE TRAY) ×3 IMPLANT
KIT DRAINAGE VACCUM ASSIST (KITS) ×1 IMPLANT
KIT ROOM TURNOVER OR (KITS) ×3 IMPLANT
KIT SUCTION CATH 14FR (SUCTIONS) ×9 IMPLANT
KIT VASOVIEW W/TROCAR VH 2000 (KITS) ×3 IMPLANT
LEAD PACING MYOCARDI (MISCELLANEOUS) ×3 IMPLANT
LINE VENT (MISCELLANEOUS) ×1 IMPLANT
MARKER GRAFT CORONARY BYPASS (MISCELLANEOUS) ×9 IMPLANT
NS IRRIG 1000ML POUR BTL (IV SOLUTION) ×16 IMPLANT
PACK OPEN HEART (CUSTOM PROCEDURE TRAY) ×3 IMPLANT
PAD ARMBOARD 7.5X6 YLW CONV (MISCELLANEOUS) ×8 IMPLANT
PAD ELECT DEFIB RADIOL ZOLL (MISCELLANEOUS) ×3 IMPLANT
PENCIL BUTTON HOLSTER BLD 10FT (ELECTRODE) ×4 IMPLANT
PUNCH AORTIC ROT 4.0MM RCL 40 (MISCELLANEOUS) ×1 IMPLANT
PUNCH AORTIC ROTATE 4.0MM (MISCELLANEOUS) IMPLANT
PUNCH AORTIC ROTATE 4.5MM 8IN (MISCELLANEOUS) IMPLANT
PUNCH AORTIC ROTATE 5MM 8IN (MISCELLANEOUS) IMPLANT
SET CARDIOPLEGIA MPS 5001102 (MISCELLANEOUS) ×1 IMPLANT
SOLUTION ANTI FOG 6CC (MISCELLANEOUS) ×1 IMPLANT
SPONGE LAP 18X18 X RAY DECT (DISPOSABLE) ×2 IMPLANT
SPONGE LAP 4X18 X RAY DECT (DISPOSABLE) ×1 IMPLANT
SUT BONE WAX W31G (SUTURE) ×3 IMPLANT
SUT ETHIBOND X763 2 0 SH 1 (SUTURE) ×6 IMPLANT
SUT MNCRL AB 3-0 PS2 18 (SUTURE) ×6 IMPLANT
SUT MNCRL AB 4-0 PS2 18 (SUTURE) ×1 IMPLANT
SUT PDS AB 1 CTX 36 (SUTURE) ×6 IMPLANT
SUT PROLENE 2 0 SH DA (SUTURE) IMPLANT
SUT PROLENE 3 0 SH DA (SUTURE) ×9 IMPLANT
SUT PROLENE 3 0 SH1 36 (SUTURE) ×3 IMPLANT
SUT PROLENE 4 0 RB 1 (SUTURE) ×9
SUT PROLENE 4 0 SH DA (SUTURE) ×1 IMPLANT
SUT PROLENE 4-0 RB1 .5 CRCL 36 (SUTURE) IMPLANT
SUT PROLENE 5 0 C 1 36 (SUTURE) IMPLANT
SUT PROLENE 6 0 C 1 30 (SUTURE) IMPLANT
SUT PROLENE 7.0 RB 3 (SUTURE) ×9 IMPLANT
SUT PROLENE 8 0 BV175 6 (SUTURE) IMPLANT
SUT PROLENE BLUE 7 0 (SUTURE) ×3 IMPLANT
SUT PROLENE POLY MONO (SUTURE) IMPLANT
SUT SILK  1 MH (SUTURE) ×3
SUT SILK 1 MH (SUTURE) ×4 IMPLANT
SUT STEEL 6MS V (SUTURE) IMPLANT
SUT STEEL STERNAL CCS#1 18IN (SUTURE) ×1 IMPLANT
SUT STEEL SZ 6 DBL 3X14 BALL (SUTURE) ×2 IMPLANT
SUT VIC AB 1 CTX 36 (SUTURE) ×3
SUT VIC AB 1 CTX36XBRD ANBCTR (SUTURE) ×1 IMPLANT
SUT VIC AB 2-0 CT1 27 (SUTURE) ×3
SUT VIC AB 2-0 CT1 TAPERPNT 27 (SUTURE) IMPLANT
SUT VIC AB 2-0 CTX 27 (SUTURE) IMPLANT
SUT VIC AB 3-0 SH 27 (SUTURE)
SUT VIC AB 3-0 SH 27X BRD (SUTURE) IMPLANT
SUT VIC AB 3-0 X1 27 (SUTURE) IMPLANT
SUT VICRYL 4-0 PS2 18IN ABS (SUTURE) IMPLANT
SUTURE E-PAK OPEN HEART (SUTURE) ×3 IMPLANT
SYSTEM SAHARA CHEST DRAIN ATS (WOUND CARE) ×3 IMPLANT
TAPE CLOTH SURG 4X10 WHT LF (GAUZE/BANDAGES/DRESSINGS) ×1 IMPLANT
TOWEL OR 17X24 6PK STRL BLUE (TOWEL DISPOSABLE) ×6 IMPLANT
TOWEL OR 17X26 10 PK STRL BLUE (TOWEL DISPOSABLE) ×6 IMPLANT
TRAY FOLEY IC TEMP SENS 16FR (CATHETERS) ×3 IMPLANT
TUBING INSUFFLATION (TUBING) ×3 IMPLANT
UNDERPAD 30X30 INCONTINENT (UNDERPADS AND DIAPERS) ×3 IMPLANT
WATER STERILE IRR 1000ML POUR (IV SOLUTION) ×6 IMPLANT

## 2014-07-10 NOTE — Progress Notes (Signed)
SICU rapid wean protocol initiated. Pt placed on SIMV(PRVC) 40%/4/550/+5/PS 10. Pt tolerating current vent settings. RT will continue to monitor.

## 2014-07-10 NOTE — Transfer of Care (Signed)
Immediate Anesthesia Transfer of Care Note  Patient: Beyonka Pitney  Procedure(s) Performed: Procedure(s): CORONARY ARTERY BYPASS GRAFTING (CABG), ON PUMP, TIMES TWO, USING LEFT INTERNAL MAMMARY ARTERY, RIGHT GREATER SAPHENOUS VEIN HARVESTED ENDOSCOPICALLY (N/A) RESECTION OF LEFT ATRIAL MASS (N/A) TRANSESOPHAGEAL ECHOCARDIOGRAM (TEE) (N/A) REPAIR OF PATENT FORAMEN OVALE (N/A)  Patient Location: SICU  Anesthesia Type:General  Level of Consciousness: Patient remains intubated per anesthesia plan  Airway & Oxygen Therapy: Patient remains intubated per anesthesia plan and Patient placed on Ventilator (see vital sign flow sheet for setting)  Post-op Assessment: Report given to RN and Post -op Vital signs reviewed and stable  Post vital signs: Reviewed and stable  Last Vitals:  Filed Vitals:   07/10/14 0510  BP: 129/67  Pulse: 70  Temp: 36.6 C  Resp: 16   Hr 79, BP 136/78, sats 100% placed on vent by RT   Complications: No apparent anesthesia complications

## 2014-07-10 NOTE — Progress Notes (Signed)
Patient ID: Hannah Nielsen, female   DOB: May 29, 1950, 64 y.o.   MRN: [id number] EVENING ROUNDS NOTE :     [address]       ,[place] [address]             [phone]                 Day of Surgery Procedure(s) (LRB): CORONARY ARTERY BYPASS GRAFTING (CABG), ON PUMP, TIMES TWO, USING LEFT INTERNAL MAMMARY ARTERY, RIGHT GREATER SAPHENOUS VEIN HARVESTED ENDOSCOPICALLY (N/A) RESECTION OF LEFT ATRIAL MASS (N/A) TRANSESOPHAGEAL ECHOCARDIOGRAM (TEE) (N/A) REPAIR OF PATENT FORAMEN OVALE (N/A)  Total Length of Stay:  LOS: 9 days  BP 83/56 mmHg  Pulse 70  Temp(Src) 97.9 F (36.6 C) (Oral)  Resp 18  Ht 5' 2.5" (1.588 m)  Wt 189 lb 1.6 oz (85.775 kg)  BMI 34.01 kg/m2  SpO2 99%  .Intake/Output      03/31 0701 - 04/01 0700   P.O.    I.V. (mL/kg) 2473.8 (28.8)   Blood 322   NG/GT 30   IV Piggyback 850   Total Intake(mL/kg) 3675.8 (42.9)   Urine (mL/kg/hr) 1490 (1.3)   Emesis/NG output 100 (0.1)   Blood 850 (0.8)   Chest Tube 320 (0.3)   Total Output 2760   Net +915.8         . sodium chloride 20 mL/hr at 07/10/14 1700  . [START ON 07/11/2014] sodium chloride    . sodium chloride 10 mL/hr at 07/10/14 1330  . sodium chloride 100 mL/hr at 07/10/14 1330  . dexmedetomidine Stopped (07/10/14 1600)  . insulin (NOVOLIN-R) infusion 1.6 Units/hr (07/10/14 1900)  . lactated ringers    . lactated ringers    . nitroGLYCERIN Stopped (07/10/14 1330)  . phenylephrine (NEO-SYNEPHRINE) Adult infusion 20 mcg/min (07/10/14 1700)     Lab Results  Component Value Date   WBC 20.8* 07/10/2014   HGB 11.6* 07/10/2014   HCT 34.5* 07/10/2014   PLT 222 07/10/2014   GLUCOSE 112* 07/10/2014   CHOL 149 07/08/2014   TRIG 224* 07/08/2014   HDL 30* 07/08/2014   LDLCALC 74 07/08/2014   ALT 19 07/09/2014   AST 17 07/09/2014   NA 143 07/10/2014   K 4.2 07/10/2014   CL 107 07/10/2014   CREATININE 0.50 07/10/2014   BUN 12 07/10/2014   CO2 24 07/10/2014   INR 1.34 07/10/2014   HGBA1C 6.1* 07/08/2014   Now  extubated, neuro intact Not bleeding  Grace Isaac MD  Beeper [phone] Office [phone] 07/10/2014 8:12 PM

## 2014-07-10 NOTE — OR Nursing (Signed)
13:05 - 2nd call to SICU

## 2014-07-10 NOTE — Anesthesia Procedure Notes (Signed)
Procedure Name: Intubation Date/Time: 07/10/2014 7:41 AM Performed by: Merrilyn Puma B Pre-anesthesia Checklist: Patient identified, Timeout performed, Emergency Drugs available, Suction available and Patient being monitored Patient Re-evaluated:Patient Re-evaluated prior to inductionOxygen Delivery Method: Circle system utilized Preoxygenation: Pre-oxygenation with 100% oxygen Intubation Type: IV induction Ventilation: Mask ventilation without difficulty and Oral airway inserted - appropriate to patient size Laryngoscope Size: Mac and 3 Grade View: Grade III Tube type: Oral Tube size: 7.5 mm Number of attempts: 1 Airway Equipment and Method: Stylet Placement Confirmation: positive ETCO2,  CO2 detector,  ETT inserted through vocal cords under direct vision and breath sounds checked- equal and bilateral Secured at: 21 cm Tube secured with: Tape Dental Injury: Teeth and Oropharynx as per pre-operative assessment

## 2014-07-10 NOTE — Op Note (Signed)
CARDIOTHORACIC SURGERY OPERATIVE NOTE  Date of Procedure:  07/10/2014  Preoperative Diagnosis:   Left Atrial Mass  Severe 2-vessel Coronary Artery Disease  Patent Foramen Ovale  Postoperative Diagnosis: Same  Procedure:    Resection of Left Atrial Mass   Coronary Artery Bypass Grafting x 2  Left Internal Mammary Artery to Distal Left Anterior Descending Coronary Artery Saphenous Vein Graft to Distal Right Coronary Artery Endoscopic Vein Harvest from Right Thigh   Closure of Patent Foramen Ovale    Surgeon: Valentina Gu. 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 The patient was in her usual state of health until the early morning hours of 07/01/2014 when she awoke from her sleep with numbness and weakness involving her left hand and forearm. Symptoms persisted for several hours, ultimately prompting the patient to present to the emergency department. She had persistent numbness and weakness involving her left hand, and could not grasp objects at all with her left hand. She had no associated symptoms in the left lower extremity, right upper or lower extremity, either side of her face. She had no visual disturbances, dysarthria, or other focal abnormalities. There was no associated symptoms of chest pain, shortness of breath, headaches. MRI of the head revealed a small acute infarct in the right precentral cortex without hemorrhage. Subsequent MRA of the brain revealed a small (4.4 mm) aneurysm  involving the right anterior cerebral artery that was likely unrelated to the patient's presenting symptoms. Symptoms improved fairly rapidly and nearly completely resolved within 24 hours. Carotid duplex scan was notable for the absence of cerebrovascular disease. Transthoracic and transesophageal echo revealed what appeared to be a mass in the left atrium that was initially felt to be a possible myxoma. Cardiothoracic surgery consultation was requested. TEE findings were noted to be atypical. 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 The patient was cleared for possible elective surgical intervention by the neurology team, and subsequent diagnostic cardiac catheterization revealed severe 2 vessel coronary artery disease. The patient has been seen in consultation and counseled at length regarding the indications, risks and potential benefits of surgery.  All questions have been answered, and the patient provides full informed consent for the operation as described.    DETAILS OF THE OPERATIVE PROCEDURE  Preparation:  The patient is brought to the operating room on the above mentioned date and central monitoring was established by the anesthesia team including placement of Swan-Ganz catheter and radial arterial line. The patient is placed in the supine position on the operating table.  Intravenous antibiotics are administered. General endotracheal anesthesia is induced uneventfully. A Foley catheter is placed.  Baseline transesophageal echocardiogram was performed.  Findings were notable for normal LV systolic function with mild LV hypertrophy.  There is a mass noted within the left atrium consistent with possible myxoma  or fibro-elastoma. The mass has gross characteristics more  suggestive of a fibro-elastoma. The mass is located immediately inferior to the junction between the left superior pulmonary vein and the left atrium and above the orifice of the left inferior pulmonary vein. No other significant abnormalities are noted.  The patient's chest, abdomen, both groins, and both lower extremities are prepared and draped in a sterile manner. A time out procedure is performed.   Surgical Approach and Conduit Harvest:  A median sternotomy incision was performed and the left internal mammary artery is dissected from the chest wall and prepared for bypass grafting. The left internal mammary artery is notably good quality conduit. Simultaneously, saphenous vein is obtained from the patient's right thigh using endoscopic vein harvest technique. The saphenous vein is notably good quality conduit. After removal of the saphenous vein, the small surgical incisions in the lower extremity are closed with absorbable suture. Following systemic heparinization, the left internal mammary artery was transected distally noted to have excellent flow.   Extracorporeal Cardiopulmonary Bypass and Myocardial Protection:  The pericardium is opened. A portion of the patient's pericardium is removed and subsequently tanned in a bath of glutaraldehyde solution for later use during the operation. After soaking the pericardium for 3 minutes and glutaraldehyde, the pericardium is rinsed in consecutive baths of saline.    The ascending aorta is normal in appearance. The right common femoral vein is cannulated using the Seldinger technique and a guidewire advanced into the right atrium using TEE guidance.  The patient is heparinized systemically and the right common femoral vein cannulated using a 22 Fr long femoral venous cannula.  The ascending aorta is cannulated for cardiopulmonary bypass.  Adequate heparinization is verified.   A retrograde cardioplegia cannula is placed through the right atrium into  the coronary sinus.   The entire pre-bypass portion of the operation was notable for stable hemodynamics.  Cardiopulmonary bypass was begun and the surface of the heart is inspected.  A second venous cannula is placed directly into the superior vena cava.  Distal target vessels are selected for coronary artery bypass grafting.  A cardioplegia cannula is placed in the ascending aorta.  A temperature probe was placed in the interventricular septum.  The patient is cooled to 32C systemic temperature.  The aortic cross clamp is applied and cold blood cardioplegia is delivered initially in an antegrade fashion through the aortic root.   Supplemental cardioplegia is given retrograde through the coronary sinus catheter.  Iced saline slush is applied for topical hypothermia.  The initial cardioplegic arrest is rapid with early diastolic arrest.  Repeat doses of cardioplegia are administered intermittently throughout the entire cross clamp portion of the operation through the aortic root,  through the coronary sinus catheter, and through subsequently placed vein graft in order to maintain completely flat electrocardiogram and septal myocardial temperature below 15C.  Myocardial protection was felt to be excellent.   Coronary Artery Bypass Grafting:   The distal right coronary artery was grafted using a reversed saphenous vein graft in an end-to-side fashion.  At the site of distal anastomosis the target vessel was good quality and measured approximately 2.0 mm in diameter.  The distal left anterior coronary artery was grafted with the left internal mammary artery in an end-to-side fashion.  At the site of distal anastomosis the target vessel was good quality and measured approximately 1.8 mm in diameter.   Closure of Patent Foramen Ovale:  A left atriotomy incision is performed posteriorly through the intra-atrial  groove and continued partway across the back wall of the left atrium after opening the  oblique sinus inferiorly. The intra-atrial septum is exposed from within the left atrium. A small patent foramen ovale as noted. The patent foramen ovale was closed using running 4-0 Prolene suture.   Resection of Left Atrial Mass:  The floor of the left atrium is exposed using a self-retaining retractor. A small mass is immediately identified located at the junction between the left superior pulmonary vein and the left atrium along the anterior rim of the left superior pulmonary vein. The mass has a faint yellow tint in color with benign-appearing features, with gross appearance suggestive of a papillary fibro-elastoma. The mass does not have a stalk but appears to be growing out of the endocardial surface of the left atrial wall.  The mass is excised along with a full-thickness portion of the adherent left atrial wall.  The mass is sent to pathology for frozen section histology. Preliminary frozen section histology is consistent with a benign tumor, possibly myxoma versus fibro-elastoma.  The remaining hole in the back wall of the left atrium measures approximately 1.5-2.0 cm in diameter. Simple primary closure is felt likely unwise because of concerns regarding the possibility of creating stenosis of the orifice of the left superior pulmonary vein. The hole is subsequently closed using a patch constructed with the patient's autologous pericardium. The patch is sewn in place using running 3-0 Prolene suture. The Prolene suture is initially attached using Teflon felt pledgets at either end of the defect, and it is anticipated that the Teflon felt pledgets might ultimately be visible on subsequent follow-up transesophageal echocardiogram. The pledgets should not be mistaken for tumor.  The atriotomy was closed using a 2-layer closure of running 3-0 Prolene suture after placing a sump drain across the mitral valve to serve as a left ventricular vent.  The proximal vein graft anastomosis was placed directly  to the ascending aorta prior to removal of the aortic cross clamp.  The septal myocardial temperature rose rapidly after reperfusion of the left internal mammary artery graft.  One final dose of warm retrograde "hot shot" cardioplegia was administered retrograde through the coronary sinus catheter while all air was evacuated through the aortic root.  The aortic cross clamp was removed after a total cross clamp time of 100 minutes.   Procedure Completion:  The proximal and distal coronary anastomoses were inspected for hemostasis and appropriate graft orientation.  Epicardial pacing wires are fixed to the right ventricular outflow tract and to the right atrial appendage. The patient is rewarmed to 37C temperature. The aortic and left ventricular vents are removed.  The patient is weaned and disconnected from cardiopulmonary bypass.  The patient's rhythm at separation from bypass was AV paced.  The patient was weaned from cardioplegic bypass without any inotropic support. Total cardiopulmonary bypass time for the operation was 119 minutes.  Followup transesophageal echocardiogram performed after separation from bypass revealed no residual mass and no residual communication between the left and right atrium.  Left ventricular function was unchanged from preoperatively.  The aortic and superior vena cava cannula were removed uneventfully. Protamine was administered to reverse the anticoagulation. The femoral venous cannula was removed and manual pressure held on the groin for 30 minutes.  The mediastinum and pleural space were inspected for hemostasis and irrigated with saline solution. The mediastinum and both pleural spaces were drained using 4 chest tubes placed through separate stab incisions inferiorly.  The soft tissues anterior to  the aorta were reapproximated loosely. The sternum is closed with double strength sternal wire. The soft tissues anterior to the sternum were closed in multiple layers and the  skin is closed with a running subcuticular skin closure.  The patient tolerated the procedure well and is transported to the surgical intensive care in stable condition. There are no intraoperative complications. All sponge instrument and needle counts are verified correct at completion of the operation.   The post-bypass portion of the operation was notable for stable rhythm and hemodynamics.   No blood products were administered during the operation.    Valentina Gu. Roxy Manns MD 07/10/2014 1:16 PM

## 2014-07-10 NOTE — Anesthesia Preprocedure Evaluation (Addendum)
Anesthesia Evaluation  Patient identified by MRN, date of birth, ID band Patient awake    Reviewed: Allergy & Precautions, NPO status , Patient's Chart, lab work & pertinent test results, reviewed documented beta blocker date and time   Airway Mallampati: I       Dental  (+) Dental Advisory Given   Pulmonary Current Smoker,  breath sounds clear to auscultation        Cardiovascular hypertension, Pt. on medications and Pt. on home beta blockers + CAD and + Peripheral Vascular Disease Rhythm:Regular Rate:Normal  L atrial myxoma Severe 2 vessel disease per cardiac cath 07/04/14 Echo - 06/13/14 - - Left ventricle: The cavity size was normal. Wall thickness wasnormal. Systolic function was normal. The estimated ejectionfraction was in the range of 60% to 65%. - Left atrium: There is a 1 x 1.3cm well circumscribed mass on thelateral surface of the left atrium just below the takeoff of left superior pulmonary vein that is fixed to the left atrial surface with multiple frond like mobile fingerlike projections. Mass issuspicious for myxoma with associated thrombogenic material orperhaps non valvular fibroelastoma with its frond likeappearance. - Atrial septum: Very small patent foramen ovale was identified. Echo contrast study showed very mild right-to-left atrial level shunt (small quantity of bubble cross over), following anincrease in RA pressure induced by provocative maneuvers.   Neuro/Psych CVA    GI/Hepatic   Endo/Other    Renal/GU      Musculoskeletal   Abdominal   Peds  Hematology   Anesthesia Other Findings   Reproductive/Obstetrics                           Anesthesia Physical Anesthesia Plan  ASA: III  Anesthesia Plan: General   Post-op Pain Management:    Induction:   Airway Management Planned: Oral ETT  Additional Equipment: Arterial line, PA Cath, CVP and 3D TEE  Intra-op Plan:    Post-operative Plan: Post-operative intubation/ventilation  Informed Consent: I have reviewed the patients History and Physical, chart, labs and discussed the procedure including the risks, benefits and alternatives for the proposed anesthesia with the patient or authorized representative who has indicated his/her understanding and acceptance.   Dental advisory given  Plan Discussed with: Anesthesiologist and Surgeon  Anesthesia Plan Comments:         Anesthesia Quick Evaluation

## 2014-07-10 NOTE — Anesthesia Postprocedure Evaluation (Signed)
  Anesthesia Post-op Note  Patient: Rosio Nielsen  Procedure(s) Performed: Procedure(s): CORONARY ARTERY BYPASS GRAFTING (CABG), ON PUMP, TIMES TWO, USING LEFT INTERNAL MAMMARY ARTERY, RIGHT GREATER SAPHENOUS VEIN HARVESTED ENDOSCOPICALLY (N/A) RESECTION OF LEFT ATRIAL MASS (N/A) TRANSESOPHAGEAL ECHOCARDIOGRAM (TEE) (N/A) REPAIR OF PATENT FORAMEN OVALE (N/A)  Patient Location: PACU  Anesthesia Type:General  Level of Consciousness: sedated, unresponsive and Patient remains intubated per anesthesia plan  Airway and Oxygen Therapy: Patient remains intubated per anesthesia plan and Patient placed on Ventilator (see vital sign flow sheet for setting)  Post-op Pain: mild  Post-op Assessment: Post-op Vital signs reviewed and Patient's Cardiovascular Status Stable  Post-op Vital Signs: stable  Last Vitals:  Filed Vitals:   07/10/14 1415  BP: 85/52  Pulse: 80  Temp: 36.4 C  Resp: 12    Complications: No apparent anesthesia complications

## 2014-07-10 NOTE — Brief Op Note (Signed)
07/01/2014 - 07/10/2014  11:54 AM  PATIENT:  Hannah Nielsen  64 y.o. female  PRE-OPERATIVE DIAGNOSIS:  1. CAD 2. LEFT ATRIAL MASS 3. PATENT FORAMEN OVALE  POST-OPERATIVE DIAGNOSIS:  1. CAD 2. LEFT ATRIAL MASS 3. PATENT FORAMEN OVALE  PROCEDURE: TRANSESOPHAGEAL ECHOCARDIOGRAM (TEE), MEDIAN STERNOTOMY for  CORONARY ARTERY BYPASS GRAFTING (CABG) x 2 (LIMA to LAD, SVG to DISTAL RCA)  USING LEFT INTERNAL MAMMARY ARTERY and RIGHTTHIGH GREATER SAPHENOUS VEIN HARVESTED ENDOSCOPICALLY, CLOSURE OF PATENT FORAMEN OVALE, and RESECTION OF LEFT ATRIAL MASS (using a pericardial patch)  SURGEON:  Surgeon(s) and Role:    * Rexene Alberts, MD - Primary  PHYSICIAN ASSISTANT: Lars Pinks PA-C  ANESTHESIA:   general  EBL:  Total I/O In: -  Out: 725 [Urine:725]  DRAINS: Chest tubes in the mediastinal and pleural spaces   SPECIMEN:  Source of Specimen:  Left atrial mass   DISPOSITION OF SPECIMEN:  PATHOLOGY  COUNTS CORRECT:  YES  DICTATION: .Dragon Dictation  PLAN OF CARE: Admit to inpatient   PATIENT DISPOSITION:  ICU - intubated and hemodynamically stable.   Delay start of Pharmacological VTE agent (>24hrs) due to surgical blood loss or risk of bleeding: yes  BASELINE WEIGHT: 86 kg

## 2014-07-10 NOTE — OR Nursing (Signed)
12:30 - 1st call to SICU charge nurse

## 2014-07-10 NOTE — Progress Notes (Signed)
  Echocardiogram Echocardiogram Transesophageal has been performed.  Diamond Nickel 07/10/2014, 8:55 AM

## 2014-07-10 NOTE — Brief Op Note (Signed)
07/01/2014 - 07/10/2014  1:09 PM  PATIENT:  Hannah Nielsen  64 y.o. female  PRE-OPERATIVE DIAGNOSIS:  CAD MYXOMA  POST-OPERATIVE DIAGNOSIS:  CAD MYXOMA  PROCEDURE:  Procedure(s): CORONARY ARTERY BYPASS GRAFTING (CABG), ON PUMP, TIMES TWO, USING LEFT INTERNAL MAMMARY ARTERY, RIGHT GREATER SAPHENOUS VEIN HARVESTED ENDOSCOPICALLY (N/A) RESECTION OF LEFT ATRIAL MASS (N/A) TRANSESOPHAGEAL ECHOCARDIOGRAM (TEE) (N/A) REPAIR OF PATENT FORAMEN OVALE (N/A)  SURGEON:    Rexene Alberts, MD  ASSISTANTS:  Nani Skillern, PA-C  ANESTHESIA:   Rica Koyanagi, MD  CROSSCLAMP TIME:   100'  CARDIOPULMONARY BYPASS TIME: 119'  FINDINGS:  Normal LV systolic function  Good quality LIMA conduit for grafting  Good quality SVG conduit for grafting  Benign left atrial mass - frozen section pathology c/w myxoma vs fibroelastoma  BLOOD ADMINISTERED:none  DRAINS: 4 Chest Tube(s) in the mediastinum and both pleural spaces   LOCAL MEDICATIONS USED:  NONE  SPECIMEN:  Source of Specimen:  left atrial mass with adjacent left atrial wall  DISPOSITION OF SPECIMEN:  PATHOLOGY  COUNTS:  YES  TOURNIQUET:  * No tourniquets in log *  DICTATION: .Note written in EPIC  PLAN OF CARE: Admit to inpatient   PATIENT DISPOSITION:  ICU - intubated and hemodynamically stable.   Delay start of Pharmacological VTE agent (>24hrs) due to surgical blood loss or risk of bleeding: yes  COMPLICATIONS: None  BASELINE WEIGHT: 86 kg  PATIENT DISPOSITION:   TO SICU IN STABLE CONDITION  Rexene Alberts 07/10/2014 1:10 PM

## 2014-07-10 NOTE — Procedures (Signed)
Extubation Procedure Note  Patient Details:   Name: Hannah Nielsen DOB: July 30, 1950 MRN: [id number]   Airway Documentation:     Evaluation  O2 sats: stable throughout Complications: No apparent complications Patient did tolerate procedure well. Bilateral Breath Sounds: Clear, Diminished Suctioning: Airway Yes   Pt tolerated wean, positive for cuff leak, extubated to 3L [place]. No dyspnea or stridor noted after extubation. RT will continue to monitor.   Mariam Dollar 07/10/2014, 5:15 PM

## 2014-07-11 ENCOUNTER — Inpatient Hospital Stay (HOSPITAL_COMMUNITY): Payer: BC Managed Care – PPO

## 2014-07-11 DIAGNOSIS — Z951 Presence of aortocoronary bypass graft: Secondary | ICD-10-CM

## 2014-07-11 LAB — CBC
HCT: 31.7 % — ABNORMAL LOW (ref 36.0–46.0)
HEMATOCRIT: 30.7 % — AB (ref 36.0–46.0)
HEMOGLOBIN: 10.3 g/dL — AB (ref 12.0–15.0)
Hemoglobin: 10.8 g/dL — ABNORMAL LOW (ref 12.0–15.0)
MCH: 31.4 pg (ref 26.0–34.0)
MCH: 31.2 pg (ref 26.0–34.0)
MCHC: 34.1 g/dL (ref 30.0–36.0)
MCHC: 33.6 g/dL (ref 30.0–36.0)
MCV: 92.2 fL (ref 78.0–100.0)
MCV: 93 fL (ref 78.0–100.0)
PLATELETS: 226 10*3/uL (ref 150–400)
Platelets: 187 10*3/uL (ref 150–400)
RBC: 3.44 MIL/uL — ABNORMAL LOW (ref 3.87–5.11)
RBC: 3.3 MIL/uL — AB (ref 3.87–5.11)
RDW: 13 % (ref 11.5–15.5)
RDW: 13 % (ref 11.5–15.5)
WBC: 18 10*3/uL — ABNORMAL HIGH (ref 4.0–10.5)
WBC: 16.8 10*3/uL — AB (ref 4.0–10.5)

## 2014-07-11 LAB — CREATININE, SERUM
Creatinine, Ser: 0.58 mg/dL (ref 0.50–1.10)
GFR calc Af Amer: >90 mL/min (ref >90)

## 2014-07-11 LAB — POCT I-STAT 3, ART BLOOD GAS (G3+)
ACID-BASE DEFICIT: 2 mmol/L (ref 0.0–2.0)
BICARBONATE: 23.4 meq/L (ref 20.0–24.0)
O2 Saturation: 95 %
PH ART: 7.366 (ref 7.350–7.450)
PO2 ART: 74 mmHg — AB (ref 80.0–100.0)
TCO2: 25 mmol/L (ref 0–100)
pCO2 arterial: 40.6 mmHg (ref 35.0–45.0)

## 2014-07-11 LAB — GLUCOSE, CAPILLARY
GLUCOSE-CAPILLARY: 98 mg/dL (ref 70–99)
GLUCOSE-CAPILLARY: 100 mg/dL — AB (ref 70–99)
GLUCOSE-CAPILLARY: 130 mg/dL — AB (ref 70–99)
GLUCOSE-CAPILLARY: 160 mg/dL — AB (ref 70–99)
Glucose-Capillary: 105 mg/dL — ABNORMAL HIGH (ref 70–99)
Glucose-Capillary: 110 mg/dL — ABNORMAL HIGH (ref 70–99)
Glucose-Capillary: 122 mg/dL — ABNORMAL HIGH (ref 70–99)
Glucose-Capillary: 129 mg/dL — ABNORMAL HIGH (ref 70–99)
Glucose-Capillary: 147 mg/dL — ABNORMAL HIGH (ref 70–99)
Glucose-Capillary: 97 mg/dL (ref 70–99)

## 2014-07-11 LAB — BASIC METABOLIC PANEL
ANION GAP: 3 — AB (ref 5–15)
BUN: 11 mg/dL (ref 6–23)
CALCIUM: 7.5 mg/dL — AB (ref 8.4–10.5)
CO2: 27 mmol/L (ref 19–32)
CREATININE: 0.53 mg/dL (ref 0.50–1.10)
Chloride: 107 mmol/L (ref 96–112)
GFR calc non Af Amer: >90 mL/min (ref >90)
Glucose, Bld: 98 mg/dL (ref 70–99)
Potassium: 3.8 mmol/L (ref 3.5–5.1)
SODIUM: 137 mmol/L (ref 135–145)

## 2014-07-11 LAB — MAGNESIUM
MAGNESIUM: 2 mg/dL (ref 1.5–2.5)
Magnesium: 2.3 mg/dL (ref 1.5–2.5)

## 2014-07-11 MED ORDER — INSULIN ASPART 100 UNIT/ML ~~LOC~~ SOLN: 0.0000 [IU] | SUBCUTANEOUS | Status: DC

## 2014-07-11 MED ORDER — POTASSIUM CHLORIDE CRYS ER 20 MEQ PO TBCR
20.0000 meq | EXTENDED_RELEASE_TABLET | Freq: Every day | ORAL | Status: DC
  Administered 2014-07-12 – 2014-07-13 (×2): 20 meq via ORAL
  Filled 2014-07-11 (×3): qty 1

## 2014-07-11 MED ORDER — ATORVASTATIN CALCIUM 80 MG PO TABS
80.0000 mg | ORAL_TABLET | Freq: Every day | ORAL | Status: DC
  Administered 2014-07-12 – 2014-07-13 (×2): 80 mg via ORAL
  Filled 2014-07-11 (×3): qty 1

## 2014-07-11 MED ORDER — SODIUM CHLORIDE 0.9 % IV SOLN: 250.0000 mL | INTRAVENOUS | Status: DC | PRN

## 2014-07-11 MED ORDER — OXYBUTYNIN CHLORIDE ER 10 MG PO TB24
10.0000 mg | ORAL_TABLET | Freq: Every day | ORAL | Status: DC
  Administered 2014-07-12 – 2014-07-13 (×2): 10 mg via ORAL
  Filled 2014-07-11 (×3): qty 1

## 2014-07-11 MED ORDER — MORPHINE SULFATE 2 MG/ML IJ SOLN
2.0000 mg | INTRAMUSCULAR | Status: DC | PRN
  Administered 2014-07-11: 2 mg via INTRAVENOUS
  Filled 2014-07-11: qty 1

## 2014-07-11 MED ORDER — FUROSEMIDE 40 MG PO TABS
40.0000 mg | ORAL_TABLET | Freq: Every day | ORAL | Status: DC
  Administered 2014-07-12: 40 mg via ORAL
  Filled 2014-07-11 (×2): qty 1

## 2014-07-11 MED ORDER — SODIUM CHLORIDE 0.9 % IJ SOLN: 3.0000 mL | INTRAMUSCULAR | Status: DC | PRN

## 2014-07-11 MED ORDER — INSULIN ASPART 100 UNIT/ML ~~LOC~~ SOLN
0.0000 [IU] | SUBCUTANEOUS | Status: DC
  Administered 2014-07-11 – 2014-07-12 (×5): 2 [IU] via SUBCUTANEOUS

## 2014-07-11 MED ORDER — SODIUM CHLORIDE 0.9 % IJ SOLN
3.0000 mL | Freq: Two times a day (BID) | INTRAMUSCULAR | Status: DC
  Administered 2014-07-11 – 2014-07-12 (×2): 3 mL via INTRAVENOUS

## 2014-07-11 MED ORDER — MOVING RIGHT ALONG BOOK
Freq: Once | Status: AC
Start: 2014-07-11 — End: 2014-07-11
  Administered 2014-07-11: 14:00:00
  Filled 2014-07-11: qty 1

## 2014-07-11 NOTE — Plan of Care (Signed)
Problem: Phase II - Intermediate Post-Op Goal: Advance Diet Outcome: Completed/Met Date Met:  07/11/14 Changing to full liquids for dinner

## 2014-07-11 NOTE — Progress Notes (Signed)
[name]  1 Day Post-Op  S/P Procedure(s) (LRB): CORONARY ARTERY BYPASS GRAFTING (CABG), ON PUMP, TIMES TWO, USING LEFT INTERNAL MAMMARY ARTERY, RIGHT GREATER SAPHENOUS VEIN HARVESTED ENDOSCOPICALLY (N/A) RESECTION OF LEFT ATRIAL MASS (N/A) TRANSESOPHAGEAL ECHOCARDIOGRAM (TEE) (N/A) REPAIR OF PATENT FORAMEN OVALE (N/A)   Doing well following transfer to step down NSR w/ stable BP Breathing comfortably  Plan: Continue routine care  Rexene Alberts 07/11/2014 8:49 PM

## 2014-07-11 NOTE — Progress Notes (Addendum)
[address]       [place] [address]             [phone]        CARDIOTHORACIC SURGERY PROGRESS NOTE   R1 Day Post-Op Procedure(s) (LRB): CORONARY ARTERY BYPASS GRAFTING (CABG), ON PUMP, TIMES TWO, USING LEFT INTERNAL MAMMARY ARTERY, RIGHT GREATER SAPHENOUS VEIN HARVESTED ENDOSCOPICALLY (N/A) RESECTION OF LEFT ATRIAL MASS (N/A) TRANSESOPHAGEAL ECHOCARDIOGRAM (TEE) (N/A) REPAIR OF PATENT FORAMEN OVALE (N/A)  Subjective: Looks good.  Mild soreness in chest.  Denies SOB  Objective: Vital signs: BP Readings from Last 1 Encounters:  07/11/14 112/60   Pulse Readings from Last 1 Encounters:  07/11/14 80   Resp Readings from Last 1 Encounters:  07/11/14 13   Temp Readings from Last 1 Encounters:  07/11/14 97.9 F (36.6 C)     Hemodynamics: PAP: (29-51)/(12-37) 37/16 mmHg CO:  [3.3 L/min-4.7 L/min] 4.6 L/min CI:  [1.8 L/min/m2-2.5 L/min/m2] 2.5 L/min/m2  Physical Exam:  Rhythm:   sinus  Breath sounds: clear  Heart sounds:  RRR  Incisions:  Dressing dry, intact  Abdomen:  Soft, non-distended, non-tender  Extremities:  Warm, well-perfused  Chest tubes:  Low volume thin serosanguinous output, no air leak    Intake/Output from previous day: 03/31 0701 - 04/01 0700 In: 5544.3 [P.O.:600; I.V.:3492.3; Blood:322; NG/GT:30; IV Piggyback:1100] Out: 9381 [WEXHB:7169; Emesis/NG output:100; Blood:850; Chest Tube:550] Intake/Output this shift:    Lab Results:  CBC: Recent Labs  07/10/14 1904 07/11/14 0524  WBC 20.8* 18.0*  HGB 11.6* 10.8*  HCT 34.5* 31.7*  PLT 222 226    BMET:  Recent Labs  07/10/14 0329  07/10/14 1859 07/10/14 1904 07/11/14 0524  NA 140  < > 143  --  137  K 4.1  < > 4.2  --  3.8  CL 106  < > 107  --  107  CO2 24  --   --   --  27  GLUCOSE 101*  < > 112*  --  98  BUN 10  < > 12  --  11  CREATININE 0.73  < > 0.50 0.62 0.53  CALCIUM 9.2  --   --   --  7.5*  < > = values in this interval not displayed.   CBG  (last 3)   Recent Labs  07/11/14 0202 07/11/14 0309 07/11/14 0511  GLUCAP 97 98 105*    ABG    Component Value Date/Time   PHART 7.366 07/11/2014 0514   PCO2ART 40.6 07/11/2014 0514   PO2ART 74.0* 07/11/2014 0514   HCO3 23.4 07/11/2014 0514   TCO2 25 07/11/2014 0514   ACIDBASEDEF 2.0 07/11/2014 0514   O2SAT 95.0 07/11/2014 0514    CXR: Mild bibasilar atelectasis and pulm vasc congestion  Assessment/Plan: S/P Procedure(s) (LRB): CORONARY ARTERY BYPASS GRAFTING (CABG), ON PUMP, TIMES TWO, USING LEFT INTERNAL MAMMARY ARTERY, RIGHT GREATER SAPHENOUS VEIN HARVESTED ENDOSCOPICALLY (N/A) RESECTION OF LEFT ATRIAL MASS (N/A) TRANSESOPHAGEAL ECHOCARDIOGRAM (TEE) (N/A) REPAIR OF PATENT FORAMEN OVALE (N/A)  Overall doing well POD1 Maintaining NSR w/ stable hemodynamics on low dose Neo drip Expected post op acute blood loss anemia, mild, stable Expected post op volume excess, mild Expected post op atelectasis, mild Recent stroke   Mobilize  Wean neo drip as tolerated  Hold diuretics until BP stable off Neo  D/C tubes and lines  Routine early postop Watch platelet count - no heparin or lovenox - SCD's for DVT prophylaxis   Valentina Gu  Roxy Manns 07/11/2014 7:41 AM

## 2014-07-11 NOTE — Op Note (Signed)
NAMECHINMAYI, RUMER NO.:  [id number]  MEDICAL RECORD NO.:  [id number]  LOCATION:  2S10C                        FACILITY:  [facility]  PHYSICIAN:  Ala Dach, M.D.DATE OF BIRTH:  04/09/1951  DATE OF PROCEDURE:  07/10/2014 DATE OF DISCHARGE:                              OPERATIVE REPORT   INTRAOPERATIVE TRANSESOPHAGEAL ECHOCARDIOGRAPHIC REPORT:  INDICATIONS FOR PROCEDURE:  Ms. Schmall is a 64 year old woman, who presents today for coronary artery bypass grafting and excision of a left atrial mass.  Surgery is to be carried out by Darylene Price.  DESCRIPTION OF PROCEDURE:  The patient was brought to the holding area the morning of surgery, where under local anesthesia with sedation, pulmonary artery and radial arterial lines were placed.  She was then taken to the OR for routine induction of general anesthesia, after which the TEE probe was prepared and passed oropharyngeally into the stomach, then withdrawn slightly for imaging of the cardiac structures.  PRECARDIOPULMONARY BYPASS TEE EXAMINATION:  Left ventricle:  The left ventricular chamber is seen initially in the short axis view.  There is appreciation for mild left ventricular hypertrophy.  There is overall vigorous contractile pattern appreciated in all segmental wall areas. The left ventricular reveals a slight thickening abnormality noted primarily in the upper left ventricular chamber area.  Aortic valve:  The aortic valve is seen in the short axis view.  It is in fact trileaflet.  It opens satisfactorily during systolic ejection and closes appropriately during diastole.  There is neither any insufficiency nor aortic stenosis appreciated.  There is mild aortic sclerosis noted.  Right ventricle:  Right ventricular chamber is seen in the four-chamber view.  It is normal in its size and function overall.  A moderator band is appreciated as well as a pulmonary artery catheter appreciated.  Mitral  valve:  The mitral valve chamber is viewed in the 4 chamber view. Mitral leaflets are mildly thickened in appearance.  Overall, there excursion and coaptation are essentially normal during systolic and diastolic cycle.  Color Doppler reveals only trivial regurgitant flow across the mitral valve apparatus.  Attention is finally drawn to the left atrial chamber itself.  This is a mildly enlarged chamber that is appreciated, seen again in the short axis view, and a 2 atrial view.  At the junction within this chamber of the left upper pulmonary vein and the left atrial appendage at the so called Coumadin ridge area, we see a slightly mobile 1 cm x 0.75 cm mass that stands out and is well visualized within the left atrial chamber.  This mass is circular in appearance.  It is somewhat mobile.  The edges of the mass appear in fact showing raggedy edges and some mild motion as well.  We could not tell whether it had a large base or narrow base neck.  It does appear to be somewhat cystic in its appearance on Doppler examination, but 2D and 3D images are obtained of this mass within the left atrial chamber.  The patient is placed on cardiopulmonary bypass.  Hypothermia is begun. Coronary artery bypass grafting is carried out.  After this, the left atrial chamber is opened.  The mass  has been excised in total.  The left atrial chamber subsequently closed.  De-airing maneuvers are carried out.  The patient was ultimately separated from cardiopulmonary bypass with the initial attempt.  POST CARDIOPULMONARY BYPASS EXAMINATION (LIMITED EXAM):  Left atrium: Attention is paid initially to the left atrial chamber itself.  Multiple views of the left atrial chamber carried out which shows now of the absence of the mass that had been in the prebypass area.  Multiple views of the pulmonary valve flow are obtained as well as the left atrial area, where the mass had previously been.  Appeared to be a  satisfactory complete removal of this left atrial mass.  The left atrial chamber itself was interrogated and intact. Bubble study had been done in the preop time which revealed essentially no PFO that was suspected.  Left ventricle:  The left ventricular chambers viewed in the early bypass.  It is active contractile and all segmental wall areas are vigorous.  The rest of the cardiac examination was as previously described, and the patient was returned to the cardiac intensive care unit in stable condition.          ______________________________ Ala Dach, M.D.     JTM/MEDQ  D:  07/10/2014  T:  07/11/2014  Job:  [id number]

## 2014-07-12 ENCOUNTER — Inpatient Hospital Stay (HOSPITAL_COMMUNITY): Payer: BC Managed Care – PPO

## 2014-07-12 LAB — BASIC METABOLIC PANEL
Anion gap: 0 — ABNORMAL LOW (ref 5–15)
BUN: 8 mg/dL (ref 6–23)
CHLORIDE: 101 mmol/L (ref 96–112)
CO2: 32 mmol/L (ref 19–32)
Calcium: 8.4 mg/dL (ref 8.4–10.5)
Creatinine, Ser: 0.57 mg/dL (ref 0.50–1.10)
Glucose, Bld: 152 mg/dL — ABNORMAL HIGH (ref 70–99)
Potassium: 4.4 mmol/L (ref 3.5–5.1)
Sodium: 133 mmol/L — ABNORMAL LOW (ref 135–145)

## 2014-07-12 LAB — CBC
HCT: 29.9 % — ABNORMAL LOW (ref 36.0–46.0)
Hemoglobin: 10.1 g/dL — ABNORMAL LOW (ref 12.0–15.0)
MCH: 31 pg (ref 26.0–34.0)
MCHC: 33.8 g/dL (ref 30.0–36.0)
MCV: 91.7 fL (ref 78.0–100.0)
Platelets: 217 10*3/uL (ref 150–400)
RBC: 3.26 MIL/uL — ABNORMAL LOW (ref 3.87–5.11)
RDW: 13 % (ref 11.5–15.5)
WBC: 16 10*3/uL — ABNORMAL HIGH (ref 4.0–10.5)

## 2014-07-12 LAB — GLUCOSE, CAPILLARY
GLUCOSE-CAPILLARY: 132 mg/dL — AB (ref 70–99)
Glucose-Capillary: 148 mg/dL — ABNORMAL HIGH (ref 70–99)
Glucose-Capillary: 157 mg/dL — ABNORMAL HIGH (ref 70–99)
Glucose-Capillary: 126 mg/dL — ABNORMAL HIGH (ref 70–99)
Glucose-Capillary: 133 mg/dL — ABNORMAL HIGH (ref 70–99)
Glucose-Capillary: 130 mg/dL — ABNORMAL HIGH (ref 70–99)

## 2014-07-12 MED ORDER — AMLODIPINE BESYLATE 2.5 MG PO TABS
2.5000 mg | ORAL_TABLET | Freq: Every day | ORAL | Status: DC
  Administered 2014-07-12 – 2014-07-14 (×3): 2.5 mg via ORAL
  Filled 2014-07-12 (×3): qty 1

## 2014-07-12 MED ORDER — LACTULOSE 10 GM/15ML PO SOLN
20.0000 g | Freq: Every day | ORAL | Status: DC | PRN
  Filled 2014-07-12: qty 30

## 2014-07-12 MED ORDER — GUAIFENESIN ER 600 MG PO TB12
600.0000 mg | ORAL_TABLET | Freq: Two times a day (BID) | ORAL | Status: DC | PRN
  Administered 2014-07-12 – 2014-07-13 (×4): 600 mg via ORAL
  Filled 2014-07-12 (×7): qty 1

## 2014-07-12 MED ORDER — ALUM & MAG HYDROXIDE-SIMETH 200-200-20 MG/5ML PO SUSP
30.0000 mL | Freq: Four times a day (QID) | ORAL | Status: DC | PRN
  Administered 2014-07-12: 30 mL via ORAL
  Filled 2014-07-12: qty 30

## 2014-07-12 MED ORDER — CALCIUM CARBONATE ANTACID 500 MG PO CHEW
1.0000 | CHEWABLE_TABLET | Freq: Three times a day (TID) | ORAL | Status: DC | PRN
  Administered 2014-07-12 – 2014-07-13 (×4): 200 mg via ORAL
  Filled 2014-07-12 (×8): qty 1

## 2014-07-12 NOTE — Progress Notes (Signed)
CARDIAC REHAB PHASE I   PRE:  Rate/Rhythm: 68 SR    BP: sitting 93/80    SaO2: 94-95 2L, 88-90 RA  MODE:  Ambulation: 350 ft   POST:  Rate/Rhythm: 87 SR    BP: sitting 150/69     SaO2: 93 2L,   Pt moving well, used RW and 2L O2. SaO2 88-90 RA. Encouraged IS today, sitting in recliner and walking. Rest x2 for DOE. Pt diaphoretic in bed and walking. Will f/u Monday. [id number] Hannah Nielsen [place] CES, ACSM 07/12/2014 10:20 AM

## 2014-07-12 NOTE — Progress Notes (Addendum)
[address]       [place] [address]             [phone]      2 Days Post-Op Procedure(s) (LRB): CORONARY ARTERY BYPASS GRAFTING (CABG), ON PUMP, TIMES TWO, USING LEFT INTERNAL MAMMARY ARTERY, RIGHT GREATER SAPHENOUS VEIN HARVESTED ENDOSCOPICALLY (N/A) RESECTION OF LEFT ATRIAL MASS (N/A) TRANSESOPHAGEAL ECHOCARDIOGRAM (TEE) (N/A) REPAIR OF PATENT FORAMEN OVALE (N/A)   Subjective:  [name] states she had a rough night last night.  She states she had more pain overnight.  She states it was also difficult to rest being the staff is very loud in the hallways.  She is ambulating.  No BM  Objective: Vital signs in last 24 hours: Temp:  [97.6 F (36.4 C)-98.6 F (37 C)] 98.5 F (36.9 C) (04/02 0330) Pulse Rate:  [66-80] 71 (04/02 0330) Cardiac Rhythm:  [-] Normal sinus rhythm (04/01 1730) Resp:  [16-22] 18 (04/02 0330) BP: (98-139)/(46-65) 139/46 mmHg (04/02 0330) SpO2:  [93 %-100 %] 94 % (04/02 0330) Arterial Line BP: (116-124)/(51-56) 124/53 mmHg (04/01 1300) Weight:  [203 lb 14.8 oz (92.5 kg)] 203 lb 14.8 oz (92.5 kg) (04/02 0330)  Hemodynamic parameters for last 24 hours: PAP: (36)/(16) 36/16 mmHg  Intake/Output from previous day: 04/01 0701 - 04/02 0700 In: 1093.8 [P.O.:900; I.V.:193.8] Out: 725 [Urine:535; Chest Tube:190]  General appearance: alert, cooperative and no distress Heart: regular rate and rhythm Lungs: clear to auscultation bilaterally Abdomen: soft, non-tender; bowel sounds normal; no masses,  no organomegaly Extremities: edema 1+ Wound: clean and dry  Lab Results:  Recent Labs  07/11/14 2148 07/12/14 0526  WBC 16.8* 16.0*  HGB 10.3* 10.1*  HCT 30.7* 29.9*  PLT 187 217   BMET:  Recent Labs  07/11/14 0524 07/11/14 2148 07/12/14 0526  NA 137  --  133*  K 3.8  --  4.4  CL 107  --  101  CO2 27  --  32  GLUCOSE 98  --  152*  BUN 11  --  8  CREATININE 0.53 0.58 0.57  CALCIUM 7.5*  --  8.4    PT/INR:  Recent  Labs  07/10/14 1355  LABPROT 16.7*  INR 1.34   ABG    Component Value Date/Time   PHART 7.366 07/11/2014 0514   HCO3 23.4 07/11/2014 0514   TCO2 25 07/11/2014 0514   ACIDBASEDEF 2.0 07/11/2014 0514   O2SAT 95.0 07/11/2014 0514   CBG (last 3)   Recent Labs  07/12/14 0015 07/12/14 0336 07/12/14 0753  GLUCAP 157* 148* 126*    Assessment/Plan: S/P Procedure(s) (LRB): CORONARY ARTERY BYPASS GRAFTING (CABG), ON PUMP, TIMES TWO, USING LEFT INTERNAL MAMMARY ARTERY, RIGHT GREATER SAPHENOUS VEIN HARVESTED ENDOSCOPICALLY (N/A) RESECTION OF LEFT ATRIAL MASS (N/A) TRANSESOPHAGEAL ECHOCARDIOGRAM (TEE) (N/A) REPAIR OF PATENT FORAMEN OVALE (N/A)  1. CV- maintaining NSR, mild hypertension this morning- continue Lopressor, will add home dose of Norvasc at 2.5 mg daily 2. Pulm- wean oxygen as tolerated, CXR with atelectasis/effusions, patient using IS 3. Renal- creatinine WNL, + hypervolemia, start diuresis today 4. Expected Post operative blood loss anemia- mild, Hgb 10.1 5. CBGs controlled, patient is not a diabetic will d/c SSIP and glucose checks 6. Dispo- patient doing well, maintaining NSR, adjust BP meds as allows, diurese   LOS: 11 days    BARRETT, ERIN 07/12/2014  I have seen and examined the patient and agree with the assessment and plan as outlined.  Making good progress  Rexene Alberts 07/12/2014 12:00 PM

## 2014-07-12 NOTE — Progress Notes (Signed)
Pt ambulated in hallway 350 ft on 2 liters of oxygen with rolling walker. Pt took one standing rest. Pt tolerated activity well.

## 2014-07-12 NOTE — Progress Notes (Signed)
Patient ambulated 141ft  Along 02 at 2L via [place]. Patient stopped twice to "catch breath"  02 sates remained >90% when walking.  Patient returned to bed, call bell in place.  RN will continue to monitor patient.

## 2014-07-13 LAB — GLUCOSE, CAPILLARY
GLUCOSE-CAPILLARY: 134 mg/dL — AB (ref 70–99)
Glucose-Capillary: 121 mg/dL — ABNORMAL HIGH (ref 70–99)
Glucose-Capillary: 119 mg/dL — ABNORMAL HIGH (ref 70–99)

## 2014-07-13 MED ORDER — FUROSEMIDE 40 MG PO TABS
40.0000 mg | ORAL_TABLET | Freq: Every day | ORAL | Status: DC
  Administered 2014-07-14: 40 mg via ORAL
  Filled 2014-07-13: qty 1

## 2014-07-13 MED ORDER — FUROSEMIDE 10 MG/ML IJ SOLN
40.0000 mg | Freq: Once | INTRAMUSCULAR | Status: AC
  Administered 2014-07-13: 40 mg via INTRAVENOUS
  Filled 2014-07-13: qty 4

## 2014-07-13 NOTE — Progress Notes (Signed)
Patient ambulated 350 ft with rolling walker on 2 liters 02. Patient tolerated well, returned back to bed . Call bell within reach.  RN will continue to monitor

## 2014-07-13 NOTE — Progress Notes (Signed)
EPWS  Removed per MD order and protocol. Wire ends intact. Pt tolerated procedure well. Vitals stable. Pt resting in bed X 1 hour. Call bell within reach. Bed Alarm set. Will continue to monitor.

## 2014-07-13 NOTE — Progress Notes (Signed)
Pt ambulated in hallway 550 ft on room air and tolerated activity well. Will continue to monitor.

## 2014-07-13 NOTE — Progress Notes (Signed)
Pt ambulated in hallway 350 ft on room air and tolerated activity well. Oxygen sats maintained while ambulating on room air, 94-95%. Pt tolerated activity well. Will continue to monitor.

## 2014-07-13 NOTE — Discharge Summary (Addendum)
Physician Discharge Summary  Patient ID: Hannah Nielsen MRN: [id number] DOB/AGE: [birth year] 64 y.o.  Admit date: 07/01/2014 Discharge date: 07/14/2014  Admission Diagnoses:  Patient Active Problem List   Diagnosis Date Noted  . Coronary artery disease involving native coronary artery of native heart without angina pectoris   . Papillary fibroelastoma of heart   . CAD (coronary artery disease), native coronary artery: Severe 2 vessel dz per cardiac cath 07/04/14 07/04/2014  . Mass   . Pre-diabetes 07/03/2014  . 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 Left atrial mass 07/03/2014  . Abnormal CXR 07/02/2014  . 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 Hyperlipidemia 07/02/2014  . Stroke 07/01/2014  . Hypertension 07/01/2014  . Tobacco abuse 07/01/2014   Discharge Diagnoses:   Patient Active Problem List   Diagnosis Date Noted  . S/P resection of left atrial papillary fibroelastoma and CABG x 2 07/10/2014  . Coronary artery disease involving native coronary artery of native heart without angina pectoris   . Papillary fibroelastoma of heart   . CAD (coronary artery disease), native coronary artery: Severe 2 vessel dz per cardiac cath 07/04/14 07/04/2014  . Mass   . Pre-diabetes 07/03/2014  . Left atrial mass - benign papillary fibroelastoma 07/03/2014  . Abnormal CXR 07/02/2014  . 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 Hyperlipidemia 07/02/2014  . Stroke 07/01/2014  . Hypertension 07/01/2014  . Tobacco abuse 07/01/2014   Discharged Condition: good  History of Present Illness:  Hannah Nielsen a 64 year old obese white female with no previous cardiac history but risk factors notable for history of hypertension and long-standing tobacco abuse. The patient was in her usual state of health until the early morning hours of 07/01/2014 when she awoke from her sleep with numbness and weakness involving her left  hand and forearm. Symptoms persisted for several hours, ultimately prompting the patient to present to the emergency department. She had persistent numbness and weakness involving her left hand, and could not grasp objects at all with her left hand. She had no associated symptoms in the left lower extremity, right upper or lower extremity, either side of her face. She had no visual disturbances, dysarthria, or other focal abnormalities. There was no associated symptoms of chest pain, shortness of breath, headaches. MRI of the head revealed a small acute infarct in the right precentral cortex without hemorrhage.  She was admitted by Neurology for further evaluation.   Hospital Course:   During hospitalization subsequent MRA of the brain revealed a small (4.4 mm) aneurysm involving the right anterior cerebral artery that was likely unrelated to the patient's presenting symptoms. Symptoms improved fairly rapidly and nearly completely resolved within 24 hours. Carotid duplex scan was notable for the absence of cerebrovascular disease. Transthoracic and transesophageal echo revealed what appeared to be a mass in the left atrium that was initially felt to be a possible myxoma. TEE findings were noted to be atypical. 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 The patient was cleared for possible elective surgical intervention by the neurology team, and subsequent diagnostic cardiac catheterization revealed severe 2 vessel coronary artery disease. Cardiothoracic surgery consultation was requested and she was evaluated by Hannah Nielsen who felt she would require surgical intervention.  The  risks and benefits of the procedure were explained to the patient and she was agreeable to proceed.   She was  taken to the operating room on 07/10/2014.  She underwent CABG x 2 utilizing LIMA to LAD and SVG to Distal RCA.  She underwent Resection of a Left Atrial Mass and Closure of Patent Foramen Ovale.  Finally she underwent endoscopic saphenous vein harvest from her right thigh.  She tolerated the procedure without difficulty and she was taken to the SICU in stable condition.  She was extubated the evening of surgery.  She was weaned off her Neo Synephrine drip as tolerated.  Her chest tubes and arterial lines were removed without difficulty.  She developed Thrombocytopenia and was not placed on Heparin or Lovenox.  She was maintaining NSR and ambulating in the SICU and felt medically stable for transfer to the stepdown unit on POD #1.  She has continued to progress.  She developed hypertension and was restarted on her home dose of Norvasc.  She continued to maintain NSR and her pacing wires were removed without difficulty.  She was hypervolemic and is responding well to diuresis.  She is ambulating without difficulty.  Her pain is well controlled and she is tolerating a carb modified diet.  She is felt surgically stable for discharge today.       Significant Diagnostic Studies: radiology:   Cardiac MRI:  1. Normal left ventricular size with moderate concentric hypertrophy and normal systolic function (LVEF = 62%). No regional wall motion abnormalities.  2. Normal right ventricular size, thickness and systolic function (RVEF= 01%).  3. Normal biatrial size.  4. Trivial mitral regurgitation.  5. There is a small mobile density measuring 13 x 8 mm attached to the lateral wall of the left atrium located between left upper and left lower pulmonary veins. There is no obvious penetration into the wall. There is no other mass seen anywhere else in the heart. 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  Cardiac graphics: Cardiac Catheterization   HEMODYNAMICS: Aortic pressure was  127/66; LV pressure was 125/0; LVEDP 2. There was no gradient between the left ventricle and aorta.   ANGIOGRAPHIC DATA: The left main coronary artery is a large vessel with mild distal disease.  The left anterior descending artery is a large vessel with an 95% ostial stenosis. There are several small to medium size diagonal vessels which appear patent. There is mild, diffuse disease in the LAD.  The left circumflex artery is a large vessel. There is mild disease in the proximal to mid vessel. The first obtuse marginal is large . At the origin of the second obtuse marginal, there is moderate disease across the origin of this vessel, up to 40- 50%.  The right coronary artery is a medium sized dominant vessel. In the mid segment, there is diffuse disease, up to 75%. The posterior descending artery is medium-sized and patent. The posterior lateral artery is medium-sized and patent.  Treatments: surgery:   Procedure:   Resection of Left Atrial Mass   Coronary Artery Bypass Grafting x 2 Left Internal Mammary Artery to Distal Left Anterior Descending Coronary Artery Saphenous Vein Graft to Distal Right Coronary Artery Endoscopic Vein Harvest from Right Thigh   Closure of Patent Foramen Ovale   Disposition: Home  Discharge Medications:   Medication List    STOP taking these medications        aspirin 81 MG tablet  Replaced by:  aspirin 325 MG EC tablet  hydrochlorothiazide 12.5 MG capsule  Commonly known as:  MICROZIDE     lisinopril 10 MG tablet  Commonly known as:  PRINIVIL,ZESTRIL      TAKE these medications        acetaminophen 500 MG tablet  Commonly known as:  TYLENOL  Take 500-1,000 mg by mouth every 6 (six) hours as needed for mild pain or moderate pain.     amLODipine 2.5 MG tablet  Commonly known as:  NORVASC  Take 2.5 mg by mouth at bedtime.     aspirin 325 MG EC tablet  Take 1 tablet (325 mg total) by mouth daily.     atorvastatin 80 MG  tablet  Commonly known as:  LIPITOR  Take 1 tablet (80 mg total) by mouth daily at 6 PM.     CALCIUM PO  Take 1 tablet by mouth daily.     cetirizine 10 MG tablet  Commonly known as:  ZYRTEC  Take 10 mg by mouth at bedtime.     furosemide 40 MG tablet  Commonly known as:  LASIX  Take 1 tablet (40 mg total) by mouth daily. For one week then stop.     KRILL OIL PO  Take 1 tablet by mouth daily.     LUTEIN PO  Take 1 tablet by mouth daily.     metoprolol tartrate 25 MG tablet  Commonly known as:  LOPRESSOR  Take 0.5 tablets (12.5 mg total) by mouth 2 (two) times daily.     MULTIVITAL tablet  Take 1 tablet by mouth daily.     oxybutynin 10 MG 24 hr tablet  Commonly known as:  DITROPAN-XL  Take 10 mg by mouth at bedtime.     potassium chloride SA 20 MEQ tablet  Commonly known as:  K-DUR,KLOR-CON  Take 1 tablet (20 mEq total) by mouth daily. For one week then stop.     traMADol 50 MG tablet  Commonly known as:  ULTRAM  Take 1-2 tablets (50-100 mg total) by mouth every 4 (four) hours as needed for moderate pain.       The patient has been discharged on:   1.Beta Blocker:  Yes [ x  ]                              No   [   ]                              If No, reason:  2.Ace Inhibitor/ARB: Yes [   ]                                     No  [ x   ]                                     If No, reason: Labile Blood Pressure  3.Statin:   Yes [x   ]                  No  [   ]                  If No, reason:  4.Ecasa:  Yes  [ x  ]  No   [   ]                  If No, reason:     Discharge Instructions    Amb Referral to Cardiac Rehabilitation    Complete by:  As directed      Ambulatory referral to Neurology    Complete by:  As directed   Dr. Leonie Man requests follow up for this patient in 2 months.          Follow-up Information    Follow up with Rexene Alberts, MD.   Specialty:  Cardiothoracic Surgery   Why:  Office will contact you   Contact  information:   [place] [address] [phone]       Follow up with [facility] In 4 weeks.   Why:  Please get CXR 1 hour prior to your appointment with Dr. Leota Sauers information:   [facility]       Follow up with Richardson Dopp, PA-C On 08/04/2014.   Specialty:  Physician Assistant   Why:  Appointment time is at 11:30 am   Contact information:   [address] [place] [address] [phone]       Follow up with [name], MD.   Specialty:  Internal Medicine   Why:  Call for a follow up appointment regarding further surveillance of HGA1C 6.1   Contact information:   [address] [place] [address] [phone]       Signed: Nani Skillern PA-C 07/14/2014, 10:39 AM

## 2014-07-13 NOTE — Progress Notes (Addendum)
      [address]       [place] [address]             [phone]      3 Days Post-Op Procedure(s) (LRB): CORONARY ARTERY BYPASS GRAFTING (CABG), ON PUMP, TIMES TWO, USING LEFT INTERNAL MAMMARY ARTERY, RIGHT GREATER SAPHENOUS VEIN HARVESTED ENDOSCOPICALLY (N/A) RESECTION OF LEFT ATRIAL MASS (N/A) TRANSESOPHAGEAL ECHOCARDIOGRAM (TEE) (N/A) REPAIR OF PATENT FORAMEN OVALE (N/A)   Subjective:  Hannah Nielsen is feeling better this morning.  She states she had a much better night last night.  She continues to ambulate without difficulty.  No Bm yet, but is passing flatus  Objective: Vital signs in last 24 hours: Temp:  [97.7 F (36.5 C)-98.3 F (36.8 C)] 98 F (36.7 C) (04/03 0500) Pulse Rate:  [66-73] 73 (04/03 0500) Cardiac Rhythm:  [-] Normal sinus rhythm (04/02 2031) Resp:  [19-22] 19 (04/03 0500) BP: (118-150)/(59-69) 118/59 mmHg (04/03 0500) SpO2:  [98 %-100 %] 100 % (04/03 0500)  Intake/Output from previous day: 04/02 0701 - 04/03 0700 In: 480 [P.O.:480] Out: 3600 [Urine:3600] :  General appearance: alert, cooperative and no distress Heart: regular rate and rhythm Lungs: clear to auscultation bilaterally Abdomen: soft, non-tender; bowel sounds normal; no masses,  no organomegaly Extremities: edema trace Wound: clean and dry  Lab Results:  Recent Labs  07/11/14 2148 07/12/14 0526  WBC 16.8* 16.0*  HGB 10.3* 10.1*  HCT 30.7* 29.9*  PLT 187 217   BMET:  Recent Labs  07/11/14 0524 07/11/14 2148 07/12/14 0526  NA 137  --  133*  K 3.8  --  4.4  CL 107  --  101  CO2 27  --  32  GLUCOSE 98  --  152*  BUN 11  --  8  CREATININE 0.53 0.58 0.57  CALCIUM 7.5*  --  8.4    PT/INR:  Recent Labs  07/10/14 1355  LABPROT 16.7*  INR 1.34   ABG    Component Value Date/Time   PHART 7.366 07/11/2014 0514   HCO3 23.4 07/11/2014 0514   TCO2 25 07/11/2014 0514   ACIDBASEDEF 2.0 07/11/2014 0514   O2SAT 95.0 07/11/2014 0514   CBG (last 3)    Recent Labs  07/12/14 1627 07/12/14 2200 07/13/14 0613  GLUCAP 132* 130* 134*    Assessment/Plan: S/P Procedure(s) (LRB): CORONARY ARTERY BYPASS GRAFTING (CABG), ON PUMP, TIMES TWO, USING LEFT INTERNAL MAMMARY ARTERY, RIGHT GREATER SAPHENOUS VEIN HARVESTED ENDOSCOPICALLY (N/A) RESECTION OF LEFT ATRIAL MASS (N/A) TRANSESOPHAGEAL ECHOCARDIOGRAM (TEE) (N/A) REPAIR OF PATENT FORAMEN OVALE (N/A)  1. CV- NSR, hypertension improved- continue Lopressor, Norvasc 2. Pulm- weaning off oxygen this morning, good use of IS 3. Renal- creatinine has been WNL, remains hypervolemic her weight is up 18 lbs since admission continue Lasix 4. Dispo- patient progressing well, will d/c EPW today, continue diuresis, likely home in AM   LOS: 12 days    BARRETT, ERIN 07/13/2014  I have seen and examined the patient and agree with the assessment and plan as outlined.  Doing well.  Tentatively plan d/c home tomorrow.  Rexene Alberts 07/13/2014 11:40 AM

## 2014-07-14 ENCOUNTER — Ambulatory Visit: Payer: BC Managed Care – PPO

## 2014-07-14 ENCOUNTER — Encounter (HOSPITAL_COMMUNITY): Payer: Self-pay | Admitting: Thoracic Surgery (Cardiothoracic Vascular Surgery)

## 2014-07-14 MED ORDER — OXYCODONE HCL 5 MG PO TABS: 5.0000 mg | ORAL_TABLET | ORAL | Status: DC | PRN

## 2014-07-14 MED ORDER — POTASSIUM CHLORIDE CRYS ER 20 MEQ PO TBCR: 20.0000 meq | EXTENDED_RELEASE_TABLET | Freq: Every day | ORAL | Status: DC

## 2014-07-14 MED ORDER — FUROSEMIDE 40 MG PO TABS: 40.0000 mg | ORAL_TABLET | Freq: Every day | ORAL | Status: DC

## 2014-07-14 MED ORDER — ASPIRIN 325 MG PO TBEC: 325.0000 mg | DELAYED_RELEASE_TABLET | Freq: Every day | ORAL | Status: DC

## 2014-07-14 MED ORDER — METOPROLOL TARTRATE 25 MG PO TABS: 12.5000 mg | ORAL_TABLET | Freq: Two times a day (BID) | ORAL | Status: DC

## 2014-07-14 MED ORDER — TRAMADOL HCL 50 MG PO TABS: 50.0000 mg | ORAL_TABLET | ORAL | Status: DC | PRN

## 2014-07-14 MED ORDER — ATORVASTATIN CALCIUM 80 MG PO TABS
80.0000 mg | ORAL_TABLET | Freq: Every day | ORAL | Status: DC
Start: 2014-07-14 — End: 2015-10-07

## 2014-07-14 MED FILL — Magnesium Sulfate Inj 50%: INTRAMUSCULAR | Qty: 10 | Status: AC

## 2014-07-14 MED FILL — Potassium Chloride Inj 2 mEq/ML: INTRAVENOUS | Qty: 40 | Status: AC

## 2014-07-14 MED FILL — Heparin Sodium (Porcine) Inj 1000 Unit/ML: INTRAMUSCULAR | Qty: 30 | Status: AC

## 2014-07-14 NOTE — Progress Notes (Signed)
[id number] Cardiac Rehab Completed discharge education with pt and her daughter. Pt voices understanding. Pt agrees to [facility]. CRP in [place], will send referral. We discussed smoking cessation. Pt plans to use nicotine lozenges to help with withdrawal. I gave her cessation information. I have encouraged cessation. We discussed heart healthy and diabetic diet. She seems very motivated to making changes for a healthier lifestyle. Deon Pilling, RN 07/14/2014 9:50 AM

## 2014-07-14 NOTE — Progress Notes (Addendum)
      [address]       [place] [address]             [phone]        4 Days Post-Op Procedure(s) (LRB): CORONARY ARTERY BYPASS GRAFTING (CABG), ON PUMP, TIMES TWO, USING LEFT INTERNAL MAMMARY ARTERY, RIGHT GREATER SAPHENOUS VEIN HARVESTED ENDOSCOPICALLY (N/A) RESECTION OF LEFT ATRIAL MASS (N/A) TRANSESOPHAGEAL ECHOCARDIOGRAM (TEE) (N/A) REPAIR OF PATENT FORAMEN OVALE (N/A)  Subjective: Patient had a bowel movement. She feels well and wants to go home.  Objective: Vital signs in last 24 hours: Temp:  [98.1 F (36.7 C)-99 F (37.2 C)] 98.2 F (36.8 C) (04/04 0445) Pulse Rate:  [74-79] 75 (04/04 0445) Cardiac Rhythm:  [-] Normal sinus rhythm (04/03 2006) Resp:  [18-20] 20 (04/04 0445) BP: (103-133)/(52-89) 133/60 mmHg (04/04 0445) SpO2:  [94 %-95 %] 95 % (04/04 0445) Weight:  [197 lb 15.6 oz (89.8 kg)] 197 lb 15.6 oz (89.8 kg) (04/04 0445)  Pre op weight 86 kg Current Weight  07/14/14 197 lb 15.6 oz (89.8 kg)      Intake/Output from previous day: 04/03 0701 - 04/04 0700 In: 600 [P.O.:600] Out: 2650 [Urine:2650]   Physical Exam:  Cardiovascular: RRR Pulmonary: Slightly diminished at bases; no rales, wheezes, or rhonchi. Abdomen: Soft, non tender, bowel sounds present. Extremities: Mild bilateral lower extremity edema. Wounds: Clean and dry.  No erythema or signs of infection.  Lab Results: CBC: Recent Labs  07/11/14 2148 07/12/14 0526  WBC 16.8* 16.0*  HGB 10.3* 10.1*  HCT 30.7* 29.9*  PLT 187 217   BMET:  Recent Labs  07/11/14 2148 07/12/14 0526  NA  --  133*  K  --  4.4  CL  --  101  CO2  --  32  GLUCOSE  --  152*  BUN  --  8  CREATININE 0.58 0.57  CALCIUM  --  8.4    PT/INR:  Lab Results  Component Value Date   INR 1.34 07/10/2014   INR 1.00 07/10/2014   INR 0.95 07/01/2014   ABG:  INR: Will add last result for INR, ABG once components are confirmed Will add last 4 CBG results once components are  confirmed  Assessment/Plan:  1. CV - SR in the 70's. On Norvasc 2.5 mg daily, Lopressor 12.5 mg bid. 2.  Pulmonary - On room air. Encourage incentive spirometer 3. Volume Overload - On Lasix 40 mg daily and will continue for one week after discharge  4.  Acute blood loss anemia - Last H and H stable at 10.1 and 29.9 5. Remove chest tube sutures 6. Likely discharge today  ZIMMERMAN,DONIELLE MPA-C 07/14/2014,7:23 AM  I have seen and examined the patient and agree with the assessment and plan as outlined.  D/C home today.  Rexene Alberts 07/14/2014 8:59 AM

## 2014-07-14 NOTE — Progress Notes (Signed)
Pt discharge home with daughter. Removed CTS per order. Discharge instructions reviewed appointments and medication reviewed. Pt vu.

## 2014-07-14 NOTE — Discharge Instructions (Signed)
Activity: 1.May walk up steps                2.No lifting more than ten pounds for four weeks.                 3.No driving for four weeks.                4.Stop any activity that causes chest pain, shortness of breath, dizziness, sweating or excessive weakness.                5.Avoid straining.                6.Continue with your breathing exercises daily.  Diet: Diabetic diet and Low fat, Low salt  diet  Wound Care: May shower.  Clean wounds with mild soap and water daily. Contact the office at [phone] if any problems arise.    Coronary Artery Bypass Grafting, Care After Refer to this sheet in the next few weeks. These instructions provide you with information on caring for yourself after your procedure. Your health care provider may also give you more specific instructions. Your treatment has been planned according to current medical practices, but problems sometimes occur. Call your health care provider if you have any problems or questions after your procedure. WHAT TO EXPECT AFTER THE PROCEDURE Recovery from surgery will be different for everyone. Some people feel well after 3 or 4 weeks, while for others it takes longer. After your procedure, it is typical to have the following:  Nausea and a lack of appetite.   Constipation.  Weakness and fatigue.   Depression or irritability.   Pain or discomfort at your incision site. HOME CARE INSTRUCTIONS  Take medicines only as directed by your health care provider. Do not stop taking medicines or start any new medicines without first checking with your health care provider.  Take your pulse as directed by your health care provider.  Perform deep breathing as directed by your health care provider. If you were given a device called an incentive spirometer, use it to practice deep breathing several times a day. Support your chest with a pillow or your arms when you take deep breaths or cough.  Keep incision areas clean, dry, and  protected. Remove or change any bandages (dressings) only as directed by your health care provider. You may have skin adhesive strips over the incision areas. Do not take the strips off. They will fall off on their own.  Check incision areas daily for any swelling, redness, or drainage.  If incisions were made in your legs, do the following:  Avoid crossing your legs.   Avoid sitting for long periods of time. Change positions every 30 minutes.   Elevate your legs when you are sitting.  Wear compression stockings as directed by your health care provider. These stockings help keep blood clots from forming in your legs.  Take showers once your health care provider approves. Until then, only take sponge baths. Pat incisions dry. Do not rub incisions with a washcloth or towel. Do not take baths, swim, or use a hot tub until your health care provider approves.  Eat foods that are high in fiber, such as raw fruits and vegetables, whole grains, beans, and nuts. Meats should be lean cut. Avoid canned, processed, and fried foods.  Drink enough fluid to keep your urine clear or pale yellow.  Weigh yourself every day. This helps identify if you are retaining fluid that may make your  heart and lungs work harder.  Rest and limit activity as directed by your health care provider. You may be instructed to:  Stop any activity at once if you have chest pain, shortness of breath, irregular heartbeats, or dizziness. Get help right away if you have any of these symptoms.  Move around frequently for short periods or take short walks as directed by your health care provider. Increase your activities gradually. You may need physical therapy or cardiac rehabilitation to help strengthen your muscles and build your endurance.  Avoid lifting, pushing, or pulling anything heavier than 10 lb (4.5 kg) for at least 6 weeks after surgery.  Do not drive until your health care provider approves.  Ask your health  care provider when you may return to work.  Ask your health care provider when you may resume sexual activity.  Keep all follow-up visits as directed by your health care provider. This is important. SEEK MEDICAL CARE IF:  You have swelling, redness, increasing pain, or drainage at the site of an incision.  You have a fever.  You have swelling in your ankles or legs.  You have pain in your legs.   You gain 2 or more pounds (0.9 kg) a day.  You are nauseous or vomit.  You have diarrhea. SEEK IMMEDIATE MEDICAL CARE IF:  You have chest pain that goes to your jaw or arms.  You have shortness of breath.   You have a fast or irregular heartbeat.   You notice a "clicking" in your breastbone (sternum) when you move.   You have numbness or weakness in your arms or legs.  You feel dizzy or light-headed.  MAKE SURE HEN:IDPOEUMPNT Saphenous Vein Harvesting Care After Refer to this sheet in the next few weeks. These instructions provide you with information on caring for yourself after your procedure. Your health care provider may also give you more specific instructions. Your treatment has been planned according to current medical practices, but problems sometimes occur. Call your health care provider if you have any problems or questions after your procedure. HOME CARE INSTRUCTIONS Medicine  Take whatever pain medicine your surgeon prescribes. Follow the directions carefully. Do not take over-the-counter pain medicine unless your surgeon says it is okay. Some pain medicine can cause bleeding problems for several weeks after surgery.  Follow your surgeon's instructions about driving. You will probably not be permitted to drive after heart surgery.  Take any medicines your surgeon prescribes. Any medicines you took before your heart surgery should be checked with your health care provider before you start taking them again. Wound care  If your surgeon has prescribed an elastic  bandage or stocking, ask how long you should wear it.  Check the area around your surgical cuts (incisions) whenever your bandages (dressings) are changed. Look for any redness or swelling.  You will need to return to have the stitches (sutures) or staples taken out. Ask your surgeon when to do that.  Ask your surgeon when you can shower or bathe. Activity  Try to keep your legs raised when you are sitting.  Do any exercises your health care providers have given you. These may include deep breathing exercises, coughing, walking, or other exercises. SEEK MEDICAL CARE IF:  You have any questions about your medicines.  You have more leg pain, especially if your pain medicine stops working.  New or growing bruises develop on your leg.  Your leg swells, feels tight, or becomes red.  You have numbness in  your leg. SEEK IMMEDIATE MEDICAL CARE IF:  Your pain gets much worse.  Blood or fluid leaks from any of the incisions.  Your incisions become warm, swollen, or red.  You have chest pain.  You have trouble breathing.  You have a fever.  You have more pain near your leg incision. MAKE SURE YOU:  Understand these instructions.  Will watch your condition.  Will get help right away if you are not doing well or get worse. Document Released: 12/08/2010 Document Revised: 04/02/2013 Document Reviewed: 12/08/2010 [facility] Patient Information 2015 [place], [place]. This information is not intended to replace advice given to you by your health care provider. Make sure you discuss any questions you have with your health care provider.   Understand these instructions.  Will watch your condition.  Will get help right away if you are not doing well or get worse. Document Released: 10/15/2004 Document Revised: 08/12/2013 Document Reviewed: 09/04/2012 [facility] Patient Information 2015 [place], [place]. This information is not intended to replace advice given to you by your health care  provider. Make sure you discuss any questions you have with your health care provider.

## 2014-07-14 NOTE — Progress Notes (Signed)
Patient ambulated 500 ft unassisted on room air.  Patient tolerated well.  RN will continue to monitor patient

## 2014-08-04 ENCOUNTER — Encounter: Payer: Self-pay | Admitting: Physician Assistant

## 2014-08-04 ENCOUNTER — Ambulatory Visit (INDEPENDENT_AMBULATORY_CARE_PROVIDER_SITE_OTHER): Payer: BC Managed Care – PPO | Admitting: Physician Assistant

## 2014-08-04 VITALS — BP 122/60 | HR 86 | Ht 62.5 in | Wt 189.0 lb

## 2014-08-04 DIAGNOSIS — I2510 Atherosclerotic heart disease of native coronary artery without angina pectoris: Secondary | ICD-10-CM

## 2014-08-04 DIAGNOSIS — D151 Benign neoplasm of heart: Secondary | ICD-10-CM

## 2014-08-04 DIAGNOSIS — I639 Cerebral infarction, unspecified: Secondary | ICD-10-CM | POA: Diagnosis not present

## 2014-08-04 DIAGNOSIS — Z951 Presence of aortocoronary bypass graft: Secondary | ICD-10-CM

## 2014-08-04 DIAGNOSIS — I10 Essential (primary) hypertension: Secondary | ICD-10-CM

## 2014-08-04 DIAGNOSIS — E785 Hyperlipidemia, unspecified: Secondary | ICD-10-CM

## 2014-08-04 DIAGNOSIS — Z720 Tobacco use: Secondary | ICD-10-CM

## 2014-08-04 DIAGNOSIS — I671 Cerebral aneurysm, nonruptured: Secondary | ICD-10-CM

## 2014-08-04 NOTE — Progress Notes (Signed)
Cardiology Office Note   Date:  08/04/2014   ID:  Hannah Nielsen, DOB 1950-08-17, MRN [id number]  PCP:  [name], MD  Cardiologist:  Dr. Dorris Carnes     Chief Complaint  Patient presents with  . Coronary Artery Disease  . Hospitalization Follow-up    s/p CABG and resection of L atrial mass     History of Present Illness: Hannah Nielsen is a 64 y.o. Nielsen with a hx of HTN, tobacco abuse.  Admitted 3/22-4/4 with cryptogenic stroke.  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  There was an incidental finding of 4.4 mm R ACA aneurysm.  She was followed by Neurology and noted that risk of rupture < 1% per year.  OP treatment options could be considered.  LHC demonstrated 2 v CAD.  She was seen by Dr. Roxy Manns and underwent CABG (L-LAD, S-distalRCA), resection of L atrial mass and closure of PFO.  Pathology confirmed a benign papillary fibroelastoma.  Post op course was fairly uneventful.  She remained in NSR.  She returns for FU.  Since discharge, she has been doing extremely well. She's been able to walk up to 30 minutes a day. She does get out of breath if she overdoes it. Otherwise she denies significant dyspnea. She's having some difficulty laying flat secondary to pain. She denies orthopnea. She denies PND. She denies edema. She denies syncope. She denies fevers or chills.   Studies/Reports Reviewed Today:  LHC 07/04/14 LM:  mild distal disease. LAD:   95% ostial stenosis.   LCx:  Origin OM2 up to 40- 50%. RCA:   mid up to 75%.  LVEDP was 2 mmHg.  TEE 07/03/14 - Left ventricle:  EF 60% to 65%. - Left atrium: There is a 1 x 1.3cm well circumscribed mass on the lateral surface of the left atrium just below the takeoff of left superior pulmonary vein that is fixed to the left atrial surface with multiple frond like mobile fingerlike projections. Mass is suspicious for myxoma with associated thrombogenic material or perhaps non valvular  fibroelastoma with its frond like appearance. - Atrial septum: Very small patent foramen ovale was identified. Echo contrast study showed very mild right-to-left atrial level shunt (small quantity of bubble cross over), following an increase in RA pressure induced by provocative maneuvers. Impressions:  Source of emboli has been identified. Left atrial mass. Discussed with patient and daughter. Consultation with TCTS.  Echo 07/02/14 - Mild concentric hypertrophy. Systolic function was vigorous. EF 65% to 70%. Wall motion was normal.  Grade 1 diastolic dysfunction. Impressions:  No cardiac source of emboli was indentified.  Cardiac MRI 07/04/14 IMPRESSION: 1. Normal left ventricular size with moderate concentric hypertrophy and normal systolic function (LVEF = 62%). No regional wall motion abnormalities. 2. Normal right ventricular size, thickness and systolic function (RVEF= 33%). 3. Normal biatrial size. 4. Trivial mitral regurgitation. 5. There is a small mobile density measuring 13 x 8 mm attached to the lateral wall of the left atrium located between left upper and left lower pulmonary veins. There is no obvious penetration into the wall. There is no other mass seen anywhere else in the heart. 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  Carotid US 07/08/14 Bilateral:   ICA stenosis in the 1-39% range.   Past Medical History  Diagnosis Date  . Hypertension   . 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  CAD (coronary artery disease), native coronary artery: Severe 2 vessel dz per cardiac cath 07/04/14 07/04/2014  . Left atrial mass 07/03/2014    benign papillary fibroelastoma  . Stroke 3/54/6568    acute embolic stroke to right MCA territory  . Hyperlipidemia 07/02/2014  . S/P resection of left atrial benign papillary fibroelastoma and CABG x 2 07/10/2014    LIMA to LAD, SVG to RCA, EVH via right thigh  . Papillary fibroelastoma  of heart     Past Surgical History  Procedure Laterality Date  . Abdominal hysterectomy    . Left heart catheterization with coronary angiogram N/A 07/04/2014    Procedure: LEFT HEART CATHETERIZATION WITH CORONARY ANGIOGRAM;  Surgeon: Jettie Booze, MD;  Location: [facility] CATH LAB;  Service: Cardiovascular;  Laterality: N/A;  . Tee without cardioversion N/A 07/03/2014    Procedure: TRANSESOPHAGEAL ECHOCARDIOGRAM (TEE);  Surgeon: Jerline Pain, MD;  Location: [facility] ENDOSCOPY;  Service: Cardiovascular;  Laterality: N/A;  . Coronary artery bypass graft N/A 07/10/2014    Procedure: CORONARY ARTERY BYPASS GRAFTING (CABG), ON PUMP, TIMES TWO, USING LEFT INTERNAL MAMMARY ARTERY, RIGHT GREATER SAPHENOUS VEIN HARVESTED ENDOSCOPICALLY;  Surgeon: Rexene Alberts, MD;  Location: [place];  Service: Open Heart Surgery;  Laterality: N/A;  . Excision of atrial myxoma N/A 07/10/2014    Procedure: RESECTION OF LEFT ATRIAL MASS;  Surgeon: Rexene Alberts, MD;  Location: [place];  Service: Open Heart Surgery;  Laterality: N/A;  . Tee without cardioversion N/A 07/10/2014    Procedure: TRANSESOPHAGEAL ECHOCARDIOGRAM (TEE);  Surgeon: Rexene Alberts, MD;  Location: [place];  Service: Open Heart Surgery;  Laterality: N/A;  . Repair of patent foramen ovale N/A 07/10/2014    Procedure: REPAIR OF PATENT FORAMEN OVALE;  Surgeon: Rexene Alberts, MD;  Location: [place];  Service: Open Heart Surgery;  Laterality: N/A;     Current Outpatient Prescriptions  Medication Sig Dispense Refill  . acetaminophen (TYLENOL) 500 MG tablet Take 500-1,000 mg by mouth every 6 (six) hours as needed for mild pain or moderate pain.    Marland Kitchen amLODipine (NORVASC) 2.5 MG tablet Take 2.5 mg by mouth at bedtime.     Marland Kitchen aspirin EC 325 MG EC tablet Take 1 tablet (325 mg total) by mouth daily. 30 tablet 0  . atorvastatin (LIPITOR) 80 MG tablet Take 1 tablet (80 mg total) by mouth daily at 6 PM. 30 tablet 1  . CALCIUM PO Take 1 tablet by mouth daily.    . cetirizine  (ZYRTEC) 10 MG tablet Take 10 mg by mouth at bedtime.    . Cholecalciferol (VITAMIN D-3) 1000 UNITS CAPS Take 1,000 Units by mouth daily.    Marland Kitchen docusate sodium (COLACE) 100 MG capsule Take 100 mg by mouth daily.    Marland Kitchen KRILL OIL PO Take 1 tablet by mouth daily.    . LUTEIN PO Take 1 tablet by mouth daily.    . metoprolol tartrate (LOPRESSOR) 25 MG tablet Take 0.5 tablets (12.5 mg total) by mouth 2 (two) times daily. 30 tablet 1  . Multiple Vitamins-Minerals (MULTIVITAL) tablet Take 1 tablet by mouth daily.    Marland Kitchen oxybutynin (DITROPAN-XL) 10 MG 24 hr tablet Take 10 mg by mouth at bedtime.    . Probiotic Product (PROBIOTIC DAILY PO) Take 1 capsule by mouth daily.     No current facility-administered medications for this visit.    Allergies:   Sulfa antibiotics    Social History:  The patient  reports that she  quit smoking about 4 weeks ago. She does not have any smokeless tobacco history on file. She reports that she drinks alcohol. She reports that she does not use illicit drugs.   Family History:  The patient's family history includes Cerebral aneurysm (age of onset: 63) in her father; Heart attack in her daughter and mother; Heart failure in her mother; Hypertension in her brother, mother, and sister. There is no history of Stroke.    ROS:   Please see the history of present illness.   Review of Systems  All other systems reviewed and are negative.    PHYSICAL EXAM: VS:  BP 122/60 mmHg  Pulse 86  Ht 5' 2.5" (1.588 m)  Wt 189 lb (85.73 kg)  BMI 34.00 kg/m2    Wt Readings from Last 3 Encounters:  08/04/14 189 lb (85.73 kg)  07/14/14 197 lb 15.6 oz (89.8 kg)     GEN: Well nourished, well developed, in no acute distress HEENT: normal Neck: no JVD,  no masses Cardiac:  Normal S1/S2, RRR; no murmur ,  no rubs or gallops, no edema  Respiratory:  clear to auscultation bilaterally, no wheezing, rhonchi or rales. GI: soft, nontender, nondistended, + BS MS: no deformity or  atrophy Skin: warm and dry  Neuro:  CNs II-XII intact, Strength and sensation are intact Psych: Normal affect   EKG:  EKG is ordered today.  It demonstrates:    normal sinus rhythm, HR 86, normal axis, nonspecific ST-T wave changes   Recent Labs: 07/09/2014: ALT 19 07/11/2014: Magnesium 2.0 07/12/2014: BUN 8; Creatinine 0.57; Hemoglobin 10.1*; Platelets 217; Potassium 4.4; Sodium 133*    Lipid Panel    Component Value Date/Time   CHOL 149 07/08/2014 0500   TRIG 224* 07/08/2014 0500   HDL 30* 07/08/2014 0500   CHOLHDL 5.0 07/08/2014 0500   VLDL 45* 07/08/2014 0500   LDLCALC 74 07/08/2014 0500      ASSESSMENT AND PLAN:  Coronary artery disease and Papillary fibroelastoma of heart S/P resection of left atrial papillary fibroelastoma and CABG x 2 She is progressing very well after recent bypass surgery and resection of papillary fibroelastoma. She is eager to start cardiac rehabilitation.  I will refer her to cardiac rehabilitation. I will arrange a follow-up echocardiogram. Continue ASA, statin, beta-blocker.  She will see Dr. Roxy Manns next week.  Stroke Follow-up with neurology as planned.  Essential hypertension Controlled.  Hyperlipidemia Continue statin. Arrange lipids and LFTs in 6 weeks.  Aneurysm of anterior cerebral artery: Right per MRI 07/01/14 Follow-up with neurology as planned.  Tobacco abuse  She has quit smoking.   Current medicines are reviewed at length with the patient today.  Concerns regarding medicines are as outlined above.  The following changes have been made:    None    Labs/ tests ordered today include:  Orders Placed This Encounter  Procedures  . Hepatic function panel  . Lipid Profile  . EKG 12-Lead  . 2D Echocardiogram without contrast    Disposition:   FU with Dr. Dorris Carnes 6-8 weeks.    Signed, Versie Starks, MHS 08/04/2014 11:59 AM    [facility] [address], [place], [place]  [address] Phone: [phone]; Fax: [phone]

## 2014-08-04 NOTE — Patient Instructions (Signed)
Medication Instructions:  None  Labwork: Your physician recommends that you return for lab work in: 6 weeks (LFT, Lipids)   Testing/Procedures: Your physician has requested that you have an echocardiogram. Echocardiography is a painless test that uses sound waves to create images of your heart. It provides your doctor with information about the size and shape of your heart and how well your heart's chambers and valves are working. This procedure takes approximately one hour. There are no restrictions for this procedure.   Follow-Up: Your physician recommends that you schedule a follow-up appointment in: 6-8 weeks with Dr. Harrington Challenger   Any Other Special Instructions Will Be Listed Below (If Applicable). You have been referred to cardiac rehab

## 2014-08-06 ENCOUNTER — Other Ambulatory Visit: Payer: Self-pay | Admitting: Physician Assistant

## 2014-08-08 ENCOUNTER — Other Ambulatory Visit: Payer: Self-pay | Admitting: Thoracic Surgery (Cardiothoracic Vascular Surgery)

## 2014-08-08 DIAGNOSIS — Z951 Presence of aortocoronary bypass graft: Secondary | ICD-10-CM

## 2014-08-11 ENCOUNTER — Ambulatory Visit: Payer: BC Managed Care – PPO | Admitting: Thoracic Surgery (Cardiothoracic Vascular Surgery)

## 2014-08-11 ENCOUNTER — Ambulatory Visit
Admission: RE | Admit: 2014-08-11 | Discharge: 2014-08-11 | Disposition: A | Discharge status: Home / Self Care | Payer: Medicare PPO | Source: Ambulatory Visit | Attending: Thoracic Surgery (Cardiothoracic Vascular Surgery) | Admitting: Thoracic Surgery (Cardiothoracic Vascular Surgery)

## 2014-08-11 ENCOUNTER — Ambulatory Visit (INDEPENDENT_AMBULATORY_CARE_PROVIDER_SITE_OTHER): Payer: Self-pay | Admitting: Surgical

## 2014-08-11 VITALS — BP 137/84 | HR 98 | Resp 20 | Ht 62.5 in | Wt 189.0 lb

## 2014-08-11 DIAGNOSIS — I5189 Other ill-defined heart diseases: Secondary | ICD-10-CM

## 2014-08-11 DIAGNOSIS — Z951 Presence of aortocoronary bypass graft: Secondary | ICD-10-CM

## 2014-08-11 DIAGNOSIS — R222 Localized swelling, mass and lump, trunk: Secondary | ICD-10-CM

## 2014-08-11 NOTE — Patient Instructions (Signed)
Discussed routine activity progression with the patient and she understands verbal instructions.

## 2014-08-11 NOTE — Progress Notes (Signed)
[address]       [place] [address]             [phone]                  Hannah Nielsen Medical Record #[id number] Date of Birth: 1950-11-01  Referring BZ:MCEYEM, Hannah Farr, MD Primary Cardiology: Primary Hannah Media, MD  Chief Complaint:  Follow Up Visit  Date of Procedure:07/10/2014  Preoperative Diagnosis:  Left Atrial Mass  Severe 2-vessel Coronary Artery Disease  Patent Foramen Ovale  Postoperative Diagnosis:Same  Procedure:   Resection of Left Atrial Mass   Coronary Artery Bypass Grafting x 2 Left Internal Mammary Artery to Distal Left Anterior Descending Coronary Artery Saphenous Vein Graft to Distal Right Coronary Artery Endoscopic Vein Harvest from Right Thigh   Closure of Patent Foramen Ovale   Surgeon:Hannah H. 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 She reports that she is making good progress. She is taking no narcotic pain medicine at this time. She has mild shortness of breath which is improving with ambulation. She denies fevers, chills or other constitutional symptoms. She has been done to drive and is not having any significant issues associated with this. She is scheduled to start cardiac rehabilitation soon.        Zubrod Score: At the time of surgery this patient's most appropriate activity status/level should be described as: []     0    Normal activity, no symptoms [x]     1    Restricted in physical strenuous activity but  ambulatory, able to do out light work []     2    Ambulatory and capable of self care, unable to do work activities, up and about                 >50 % of waking hours                                                                                   []     3    Only limited self care, in bed greater than 50% of waking hours []     4    Completely disabled, no self care, confined to bed or chair []     5    Moribund  History  Smoking status  . Former Smoker  . Quit date: 07/01/2014  Smokeless tobacco  . Not on file       Allergies  Allergen Reactions  . Sulfa Antibiotics     Current Outpatient Prescriptions  Medication Sig Dispense Refill  . acetaminophen (TYLENOL) 500 MG tablet Take 500-1,000 mg by mouth every 6 (six) hours as needed for mild pain or moderate pain.    Marland Kitchen amLODipine (NORVASC) 2.5 MG tablet Take  2.5 mg by mouth at bedtime.     Marland Kitchen aspirin EC 325 MG EC tablet Take 1 tablet (325 mg total) by mouth daily. 30 tablet 0  . atorvastatin (LIPITOR) 80 MG tablet Take 1 tablet (80 mg total) by mouth daily at 6 PM. 30 tablet 1  . CALCIUM PO Take 1 tablet by mouth daily.    . cetirizine (ZYRTEC) 10 MG tablet Take 10 mg by mouth at bedtime.    . Cholecalciferol (VITAMIN D-3) 1000 UNITS CAPS Take 1,000 Units by mouth daily.    Marland Kitchen docusate sodium (COLACE) 100 MG capsule Take 100 mg by mouth daily.    Marland Kitchen KRILL OIL PO Take 1 tablet by mouth daily.    . LUTEIN PO Take 1 tablet by mouth daily.    . metoprolol tartrate (LOPRESSOR) 25 MG tablet Take 0.5 tablets (12.5 mg total) by mouth 2 (two) times daily. 30 tablet 1  . Multiple Vitamins-Minerals (MULTIVITAL) tablet Take 1 tablet by mouth daily.    Marland Kitchen oxybutynin (DITROPAN-XL) 10 MG 24 hr tablet Take 10 mg by mouth at bedtime.    . Probiotic Product (PROBIOTIC DAILY PO) Take 1 capsule by mouth daily.     No current facility-administered medications for this visit.       Physical Exam: BP 137/84 mmHg  Pulse 98  Resp 20  Ht 5' 2.5"  (1.588 m)  Wt 189 lb (85.73 kg)  BMI 34.00 kg/m2  SpO2 94%  General appearance: alert, cooperative and no distress Heart: regular rate and rhythm Lungs: clear to auscultation bilaterally Abdomen: soft, non-tender; bowel sounds normal; no masses,  no organomegaly Extremities: No edema Wounds: Incisions all well-healed without evidence of infection.  Diagnostic Studies & Laboratory data:         Recent Radiology Findings: Dg Chest 2 View  08/11/2014   CLINICAL DATA:  Shortness of breath, previous history of coronary bypass grafting and atrial myxoma removed  EXAM: CHEST  2 VIEW  COMPARISON:  07/12/2014  FINDINGS: Postsurgical changes are noted. No focal infiltrate or sizable effusion is seen. Mild scarring is noted in the left mid lung. No acute bony abnormality is noted.  IMPRESSION: No acute abnormality seen.   Electronically Signed   By: Hannah Nielsen M.D.   On: 08/11/2014 15:06      I have independently reviewed the above radiology findings and reviewed findings  with the patient.  Recent Labs: Lab Results  Component Value Date   WBC 16.0* 07/12/2014   HGB 10.1* 07/12/2014   HCT 29.9* 07/12/2014   PLT 217 07/12/2014   GLUCOSE 152* 07/12/2014   CHOL 149 07/08/2014   TRIG 224* 07/08/2014   HDL 30* 07/08/2014   LDLCALC 74 07/08/2014   ALT 19 07/09/2014   AST 17 07/09/2014   NA 133* 07/12/2014   K 4.4 07/12/2014   CL 101 07/12/2014   CREATININE 0.57 07/12/2014   BUN 8 07/12/2014   CO2 32 07/12/2014   INR 1.34 07/10/2014   HGBA1C 6.1* 07/08/2014      Assessment / Plan:     The patient is doing well. She is tolerating activity progression in a routine manner. She has not had any difficulty maintaining her nonsmoking status . She is not having any surgically related issues at this time. I encouraged her to do the cardiac rehabilitation program which she intends. We will see in 2 months in routine follow-up by Dr. Roxy Nielsen.        Hannah Nielsen E 08/11/2014  3:49  PM

## 2014-08-13 ENCOUNTER — Other Ambulatory Visit: Payer: Self-pay | Admitting: Geriatric Medicine

## 2014-08-13 DIAGNOSIS — R911 Solitary pulmonary nodule: Secondary | ICD-10-CM

## 2014-08-14 ENCOUNTER — Telehealth (HOSPITAL_COMMUNITY): Payer: Self-pay | Admitting: *Deleted

## 2014-08-14 NOTE — Telephone Encounter (Signed)
Returned call to pt. Pt inquired to insurance company regarding her benefits for cardiac rehab - cpt [phone]  Pt has a 20.00 copay.  Pt can not financial afford to attend CR.  Offered suggestion for maintenance program which is self pay 68.00 a month.  Pt will think about it and let the rehab staff know her decision.

## 2014-08-15 ENCOUNTER — Other Ambulatory Visit: Payer: Self-pay | Admitting: Physician Assistant

## 2014-08-15 DIAGNOSIS — I5189 Other ill-defined heart diseases: Secondary | ICD-10-CM

## 2014-08-15 DIAGNOSIS — Z951 Presence of aortocoronary bypass graft: Secondary | ICD-10-CM

## 2014-08-18 ENCOUNTER — Other Ambulatory Visit (HOSPITAL_COMMUNITY): Payer: BC Managed Care – PPO

## 2014-08-19 ENCOUNTER — Encounter: Payer: Self-pay | Admitting: Physician Assistant

## 2014-08-19 ENCOUNTER — Ambulatory Visit (HOSPITAL_COMMUNITY): Payer: Medicare PPO | Attending: Cardiovascular Disease

## 2014-08-19 ENCOUNTER — Other Ambulatory Visit: Payer: Self-pay

## 2014-08-19 ENCOUNTER — Ambulatory Visit
Admission: RE | Admit: 2014-08-19 | Discharge: 2014-08-19 | Disposition: A | Discharge status: Home / Self Care | Payer: Medicare PPO | Source: Ambulatory Visit | Attending: Geriatric Medicine | Admitting: Geriatric Medicine

## 2014-08-19 DIAGNOSIS — E785 Hyperlipidemia, unspecified: Secondary | ICD-10-CM | POA: Diagnosis not present

## 2014-08-19 DIAGNOSIS — I5189 Other ill-defined heart diseases: Secondary | ICD-10-CM

## 2014-08-19 DIAGNOSIS — R222 Localized swelling, mass and lump, trunk: Secondary | ICD-10-CM | POA: Diagnosis not present

## 2014-08-19 DIAGNOSIS — R911 Solitary pulmonary nodule: Secondary | ICD-10-CM

## 2014-08-19 DIAGNOSIS — I10 Essential (primary) hypertension: Secondary | ICD-10-CM | POA: Insufficient documentation

## 2014-08-19 DIAGNOSIS — Z951 Presence of aortocoronary bypass graft: Secondary | ICD-10-CM | POA: Diagnosis not present

## 2014-08-19 DIAGNOSIS — Z720 Tobacco use: Secondary | ICD-10-CM | POA: Diagnosis not present

## 2014-08-20 ENCOUNTER — Telehealth: Payer: Self-pay | Admitting: *Deleted

## 2014-08-20 NOTE — Telephone Encounter (Signed)
Pt notified echo results with verbal understanding by phone today.

## 2014-09-07 ENCOUNTER — Other Ambulatory Visit: Payer: Self-pay | Admitting: Physician Assistant

## 2014-09-09 ENCOUNTER — Other Ambulatory Visit: Payer: Self-pay | Admitting: Physician Assistant

## 2014-09-15 ENCOUNTER — Other Ambulatory Visit: Payer: BC Managed Care – PPO

## 2014-09-19 ENCOUNTER — Ambulatory Visit: Payer: BC Managed Care – PPO | Admitting: Neurology

## 2014-09-22 ENCOUNTER — Telehealth: Payer: Self-pay | Admitting: *Deleted

## 2014-09-22 ENCOUNTER — Other Ambulatory Visit (INDEPENDENT_AMBULATORY_CARE_PROVIDER_SITE_OTHER): Payer: Medicare PPO | Admitting: *Deleted

## 2014-09-22 DIAGNOSIS — I10 Essential (primary) hypertension: Secondary | ICD-10-CM

## 2014-09-22 DIAGNOSIS — E785 Hyperlipidemia, unspecified: Secondary | ICD-10-CM | POA: Diagnosis not present

## 2014-09-22 LAB — LIPID PANEL
CHOL/HDL RATIO: 3
CHOLESTEROL: 112 mg/dL (ref 0–200)
HDL: 37.7 mg/dL — AB (ref >39.00)
LDL Cholesterol: 51 mg/dL (ref 0–99)
NonHDL: 74.3
TRIGLYCERIDES: 117 mg/dL (ref 0.0–149.0)
VLDL: 23.4 mg/dL (ref 0.0–40.0)

## 2014-09-22 LAB — HEPATIC FUNCTION PANEL
ALK PHOS: 118 U/L — AB (ref 39–117)
ALT: 37 U/L — ABNORMAL HIGH (ref 0–35)
AST: 24 U/L (ref 0–37)
Albumin: 3.9 g/dL (ref 3.5–5.2)
BILIRUBIN DIRECT: 0.1 mg/dL (ref 0.0–0.3)
BILIRUBIN TOTAL: 0.4 mg/dL (ref 0.2–1.2)
TOTAL PROTEIN: 7 g/dL (ref 6.0–8.3)

## 2014-09-22 NOTE — Telephone Encounter (Signed)
Pt notified of lab results and to f/u w/PCP about elevated  ALP.  Pt verbalized understanding to plan of care.

## 2014-10-06 ENCOUNTER — Telehealth: Payer: Self-pay | Admitting: Internal Medicine

## 2014-10-06 ENCOUNTER — Encounter: Payer: Self-pay | Admitting: Internal Medicine

## 2014-10-06 ENCOUNTER — Other Ambulatory Visit: Payer: Self-pay

## 2014-10-06 ENCOUNTER — Ambulatory Visit (INDEPENDENT_AMBULATORY_CARE_PROVIDER_SITE_OTHER): Payer: Medicare PPO | Admitting: Internal Medicine

## 2014-10-06 ENCOUNTER — Telehealth: Payer: Self-pay | Admitting: Neurology

## 2014-10-06 DIAGNOSIS — Z951 Presence of aortocoronary bypass graft: Secondary | ICD-10-CM | POA: Diagnosis not present

## 2014-10-06 NOTE — Telephone Encounter (Signed)
Pat with Dr. Alan Ripper  is calling and states this patient needs an appointment sooner than scheduled appt. Can you please work in. Pelase call her [phone]. Thanks!

## 2014-10-06 NOTE — Progress Notes (Signed)
Cardiology Office Note   Date:  10/06/2014   ID:  Hannah Nielsen, DOB 09-06-1950, MRN [id number]  PCP:  [name], MD  Cardiologist:   Dorris Carnes, MD   No chief complaint on file.     History of Present Illness: Hannah Nielsen is a 64 y.o. female with a history of HTN, tobacco abuse and CAD.  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  She underwent removal of fibroelastoma and CABG x 2 (LIMA TO LAD and SVG to RCA).  In addition underwent PFO closure.    She was seen by Kathleen Argue since surgery   Last lipid panel on 6/13 LDL was 51, HDL 38  Trig 117  Since seen she has been doing good  Denies CP  Breathing is good  No palpitations Worried about aneurysm.  Had appt in neuro but had to resched to Aug because of a family conflct  Worried Father died from an aneurysm.  Current Outpatient Prescriptions  Medication Sig Dispense Refill  . amLODipine (NORVASC) 2.5 MG tablet Take 2.5 mg by mouth at bedtime.     Marland Kitchen aspirin EC 325 MG EC tablet Take 1 tablet (325 mg total) by mouth daily. 30 tablet 0  . atorvastatin (LIPITOR) 80 MG tablet Take 1 tablet (80 mg total) by mouth daily at 6 PM. 30 tablet 1  . CALCIUM PO Take 1 tablet by mouth daily.    . Cholecalciferol (VITAMIN D-3) 1000 UNITS CAPS Take 1,000 Units by mouth daily.    Marland Kitchen docusate sodium (COLACE) 100 MG capsule Take 100 mg by mouth daily.    Marland Kitchen KRILL OIL PO Take 1 tablet by mouth daily.    . LUTEIN PO Take 1 tablet by mouth daily.    . metoprolol tartrate (LOPRESSOR) 25 MG tablet Take 0.5 tablets (12.5 mg total) by mouth 2 (two) times daily. 30 tablet 1  . Multiple Vitamins-Minerals (EYE VITAMINS PO) Take 1 tablet by mouth daily.    . Multiple Vitamins-Minerals (MULTIVITAL) tablet Take 1 tablet by mouth daily.    Marland Kitchen oxybutynin (DITROPAN-XL) 10 MG 24 hr tablet Take 10 mg by mouth at bedtime.    . Probiotic Product (PROBIOTIC DAILY PO) Take 1 capsule by mouth daily.      No current facility-administered medications for this visit.    Allergies:   Sulfa antibiotics   Past Medical History  Diagnosis Date  . Hypertension   . 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 CAD (coronary artery disease), native coronary artery: Severe 2 vessel dz per cardiac cath 07/04/14 07/04/2014  . Left atrial mass 07/03/2014    benign papillary fibroelastoma  . Stroke 08/12/5463    acute embolic stroke to right MCA territory  . Hyperlipidemia 07/02/2014  . S/P resection of left atrial benign papillary fibroelastoma and CABG x 2 07/10/2014    LIMA to LAD, SVG to RCA, EVH via right thigh  . Papillary fibroelastoma of heart   . Hx of echocardiogram     Echo 5/16:  EF 60-65%, no RWMA    Past Surgical History  Procedure Laterality Date  . Abdominal hysterectomy    . Left heart catheterization with coronary angiogram N/A 07/04/2014    Procedure: LEFT HEART CATHETERIZATION WITH CORONARY ANGIOGRAM;  Surgeon: Jettie Booze, MD;  Location[facility] CATH LAB;  Service: Cardiovascular;  Laterality: N/A;  .  Tee without cardioversion N/A 07/03/2014    Procedure: TRANSESOPHAGEAL ECHOCARDIOGRAM (TEE);  Surgeon: Jerline Pain, MD;  Location: [facility] ENDOSCOPY;  Service: Cardiovascular;  Laterality: N/A;  . Coronary artery bypass graft N/A 07/10/2014    Procedure: CORONARY ARTERY BYPASS GRAFTING (CABG), ON PUMP, TIMES TWO, USING LEFT INTERNAL MAMMARY ARTERY, RIGHT GREATER SAPHENOUS VEIN HARVESTED ENDOSCOPICALLY;  Surgeon: Rexene Alberts, MD;  Location: [facility];  Service: Open Heart Surgery;  Laterality: N/A;  . Excision of atrial myxoma N/A 07/10/2014    Procedure: RESECTION OF LEFT ATRIAL MASS;  Surgeon: Rexene Alberts, MD;  Location: [facility];  Service: Open Heart Surgery;  Laterality: N/A;  . Tee without cardioversion N/A 07/10/2014    Procedure: TRANSESOPHAGEAL ECHOCARDIOGRAM (TEE);  Surgeon: Rexene Alberts, MD;  Location: [facility];  Service: Open Heart  Surgery;  Laterality: N/A;  . Repair of patent foramen ovale N/A 07/10/2014    Procedure: REPAIR OF PATENT FORAMEN OVALE;  Surgeon: Rexene Alberts, MD;  Location: [facility];  Service: Open Heart Surgery;  Laterality: N/A;     Social History:  The patient  reports that she quit smoking about 3 months ago. She does not have any smokeless tobacco history on file. She reports that she drinks alcohol. She reports that she does not use illicit drugs.   Family History:  The patient's family history includes Cerebral aneurysm (age of onset: 60) in her father; Heart attack in her daughter and mother; Heart failure in her mother; Hypertension in her brother, mother, and sister. There is no history of Stroke.    ROS:  Please see the history of present illness. All other systems are reviewed and  Negative to the above problem except as noted.    PHYSICAL EXAM: VS:  BP 134/86 mmHg  Pulse 84  Ht 5' 2.5" (1.588 m)  Wt 195 lb (88.451 kg)  BMI 35.08 kg/m2  GEN: Well nourished, well developed, in no acute distress HEENT: normal Neck: no JVD, carotid bruits, or masses Cardiac: RRR; no murmurs, rubs, or gallops,no edema  Respiratory:  clear to auscultation bilaterally, normal work of breathing GI: soft, nontender, nondistended, + BS  No hepatomegaly  MS: no deformity Moving all extremities   Skin: warm and dry, no rash Neuro:  Strength and sensation are intact Psych: euthymic mood, full affect   EKG:  EKG is not ordered today.   Lipid Panel    Component Value Date/Time   CHOL 112 09/22/2014 1035   TRIG 117.0 09/22/2014 1035   HDL 37.70* 09/22/2014 1035   CHOLHDL 3 09/22/2014 1035   VLDL 23.4 09/22/2014 1035   LDLCALC 51 09/22/2014 1035      Wt Readings from Last 3 Encounters:  10/06/14 195 lb (88.451 kg)  08/11/14 189 lb (85.73 kg)  08/04/14 189 lb (85.73 kg)      ASSESSMENT AND PLAN: 1  CAD  Doing well post CABG  Entering maintenance at rehab 2.  CVA  S/p resection of fibroelastoma   With 4.4 mm aneurysm  Will check on neuro appt    3.  HL Excellent control  Keep on same regimen  4.  HTN  Adequate control    F/U in December   Signed, Dorris Carnes, MD  10/06/2014 8:27 AM    [address], [place], [place]  [address] Phone: [phone]; Fax: [phone]

## 2014-10-06 NOTE — Telephone Encounter (Signed)
New problem    Hannah Nielsen is returning your call.

## 2014-10-06 NOTE — Telephone Encounter (Signed)
See previous phone note.  

## 2014-10-06 NOTE — Patient Instructions (Addendum)
Medication Instructions:  Your physician recommends that you continue on your current medications as directed. Please refer to the Current Medication list given to you today.   Labwork: none  Testing/Procedures: none  Follow-Up: Your physician wants you to follow-up in: December 2016. You will receive a reminder letter in the mail two months in advance. If you don't receive a letter, please call our office to schedule the follow-up appointment.      We have called Dr. Clydene Fake office about an earlier appt for you and have left a message for them to return our call.

## 2014-10-06 NOTE — Telephone Encounter (Signed)
F/u   Tyler Aas stated pt has an appt at [facility] Neuro on 6.28.16 and pt is aware. Just an [place]

## 2014-10-06 NOTE — Telephone Encounter (Signed)
Patient scheduled to see Ward Givens, NP on 10/07/14. Patient notified by Rance Muir, [place].  Neoma Laming, Dr Serita Grit office notified; this RN requested she inform Fraser Din. She verbalized understanding.

## 2014-10-07 ENCOUNTER — Encounter (HOSPITAL_COMMUNITY)
Admission: RE | Admit: 2014-10-07 | Discharge: 2014-10-07 | Disposition: A | Discharge status: Home / Self Care | Payer: Self-pay | Source: Ambulatory Visit | Attending: Internal Medicine | Admitting: Internal Medicine

## 2014-10-07 ENCOUNTER — Encounter: Payer: Self-pay | Admitting: Adult Health

## 2014-10-07 ENCOUNTER — Ambulatory Visit (INDEPENDENT_AMBULATORY_CARE_PROVIDER_SITE_OTHER): Payer: Medicare PPO | Admitting: Adult Health

## 2014-10-07 VITALS — BP 136/84 | HR 84 | Ht 62.5 in | Wt 196.0 lb

## 2014-10-07 DIAGNOSIS — I671 Cerebral aneurysm, nonruptured: Secondary | ICD-10-CM

## 2014-10-07 DIAGNOSIS — Z48812 Encounter for surgical aftercare following surgery on the circulatory system: Secondary | ICD-10-CM | POA: Insufficient documentation

## 2014-10-07 DIAGNOSIS — G4719 Other hypersomnia: Secondary | ICD-10-CM | POA: Diagnosis not present

## 2014-10-07 DIAGNOSIS — R0683 Snoring: Secondary | ICD-10-CM | POA: Diagnosis not present

## 2014-10-07 DIAGNOSIS — Z8673 Personal history of transient ischemic attack (TIA), and cerebral infarction without residual deficits: Secondary | ICD-10-CM | POA: Insufficient documentation

## 2014-10-07 DIAGNOSIS — Z951 Presence of aortocoronary bypass graft: Secondary | ICD-10-CM | POA: Insufficient documentation

## 2014-10-07 NOTE — Progress Notes (Signed)
PATIENT: Hannah Nielsen DOB: [birth year]  REASON FOR VISIT: follow up HISTORY FROM: patient  HISTORY OF PRESENT ILLNESS: Hannah Nielsen is a 64 year old female with a history of CVA on 07/01/2014. The patient presented to the ED with left hand numbness. She went to bed that night and woke up around 2 AM and felt that her left hand was numb and clumsy. She drove herself to the emergency room. The MRI indicated that she had a precentral cortex infarct. The patient was out of the window for TPA. A TEE was completed and an intracardiac mass as well as a probable PFO was discovered. Later in her hospital visit she had surgery to remove the mass and close the PFO. Patient also had carotid Dopplers that indicated bilateral ECAs with increased velocity consistent with severe stenosis. Her hemoglobin A1c was 6.3. Patient's LDL was 112. Also while in the hospital they found a right ACA Aneurysm 4.4 mm. The patient has a family history of ruptured aneurysms and therefore was referred to Hannah Nielsen for follow-up. The patient was discharged on Lipitor for her cholesterol. She was discharged with aspirin 325 mg 4 stroke prevention. The patient returns today for evaluation. She continues on aspirin for secondary stroke prevention. She is also on Lipitor for her cholesterol. Her latest cholesterol numbers were excellent. Patient states that her blood pressure has been controlled. She is currently taking metoprolol and Norvasc. The patient never received a referral to [facility] in regards to her aneurysm. She states that this is the most concerning thing for her. She states that her father died of a ruptured aneurysm and of course she wants to try to prevent that from happening to her. She states that since this stroke most of her symptoms have resolved. She does have a patch of numbness on the left side of the chest and the left thigh. She states that the numbness in her hands have improved but she still has a tingling  feeling in the ring and pinky finger. Patient also reports that she does snore at night. She does not feel that she sleeps well during the night. She gets up 3-4 times to urinate. She does report daytime sleepiness. Her Epworth sleepiness scale is 15 and fatigue severity scale is 44. She states that her sister has been diagnosed with sleep apnea. She is concerned that this may be the same for her. She denies any additional stroke like symptoms. She no longer smokes cigarettes. She is currently participating  in cardiac rehabilitation. She returns today for an evaluation.  REVIEW OF SYSTEMS: Out of a complete 14 system review of symptoms, the patient complains only of the following symptoms, and all other reviewed systems are negative.  Apnea, frequent waking, daytime sleepiness, snoring, numbness  ALLERGIES: Allergies  Allergen Reactions  . Sulfa Antibiotics     HOME MEDICATIONS: Outpatient Prescriptions Prior to Visit  Medication Sig Dispense Refill  . amLODipine (NORVASC) 2.5 MG tablet Take 2.5 mg by mouth at bedtime.     Marland Kitchen aspirin EC 325 MG EC tablet Take 1 tablet (325 mg total) by mouth daily. 30 tablet 0  . atorvastatin (LIPITOR) 80 MG tablet Take 1 tablet (80 mg total) by mouth daily at 6 PM. 30 tablet 1  . CALCIUM PO Take 1 tablet by mouth daily.    . Cholecalciferol (VITAMIN D-3) 1000 UNITS CAPS Take 1,000 Units by mouth daily.    Marland Kitchen docusate sodium (COLACE) 100 MG capsule Take 100 mg by mouth daily.    Marland Kitchen  KRILL OIL PO Take 1 tablet by mouth daily.    . LUTEIN PO Take 1 tablet by mouth daily.    . metoprolol tartrate (LOPRESSOR) 25 MG tablet Take 0.5 tablets (12.5 mg total) by mouth 2 (two) times daily. 30 tablet 1  . Multiple Vitamins-Minerals (EYE VITAMINS PO) Take 1 tablet by mouth daily.    . Multiple Vitamins-Minerals (MULTIVITAL) tablet Take 1 tablet by mouth daily.    Marland Kitchen oxybutynin (DITROPAN-XL) 10 MG 24 hr tablet Take 10 mg by mouth at bedtime.    . Probiotic Product  (PROBIOTIC DAILY PO) Take 1 capsule by mouth daily.     No facility-administered medications prior to visit.    PAST MEDICAL HISTORY: Past Medical History  Diagnosis Date  . Hypertension   . 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 CAD (coronary artery disease), native coronary artery: Severe 2 vessel dz per cardiac cath 07/04/14 07/04/2014  . Left atrial mass 07/03/2014    benign papillary fibroelastoma  . Stroke 0/35/0093    acute embolic stroke to right MCA territory  . Hyperlipidemia 07/02/2014  . S/P resection of left atrial benign papillary fibroelastoma and CABG x 2 07/10/2014    LIMA to LAD, SVG to RCA, EVH via right thigh  . Papillary fibroelastoma of heart   . Hx of echocardiogram     Echo 5/16:  EF 60-65%, no RWMA    PAST SURGICAL HISTORY: Past Surgical History  Procedure Laterality Date  . Abdominal hysterectomy    . Left heart catheterization with coronary angiogram N/A 07/04/2014    Procedure: LEFT HEART CATHETERIZATION WITH CORONARY ANGIOGRAM;  Surgeon: Jettie Booze, MD;  Location: [facility] CATH LAB;  Service: Cardiovascular;  Laterality: N/A;  . Tee without cardioversion N/A 07/03/2014    Procedure: TRANSESOPHAGEAL ECHOCARDIOGRAM (TEE);  Surgeon: Jerline Pain, MD;  Location: [facility] ENDOSCOPY;  Service: Cardiovascular;  Laterality: N/A;  . Coronary artery bypass graft N/A 07/10/2014    Procedure: CORONARY ARTERY BYPASS GRAFTING (CABG), ON PUMP, TIMES TWO, USING LEFT INTERNAL MAMMARY ARTERY, RIGHT GREATER SAPHENOUS VEIN HARVESTED ENDOSCOPICALLY;  Surgeon: Rexene Alberts, MD;  Location: [facility];  Service: Open Heart Surgery;  Laterality: N/A;  . Excision of atrial myxoma N/A 07/10/2014    Procedure: RESECTION OF LEFT ATRIAL MASS;  Surgeon: Rexene Alberts, MD;  Location: [facility];  Service: Open Heart Surgery;  Laterality: N/A;  . Tee without cardioversion N/A 07/10/2014    Procedure: TRANSESOPHAGEAL ECHOCARDIOGRAM (TEE);  Surgeon: Rexene Alberts, MD;  Location: [facility];  Service: Open Heart Surgery;  Laterality: N/A;  . Repair of patent foramen ovale N/A 07/10/2014    Procedure: REPAIR OF PATENT FORAMEN OVALE;  Surgeon: Rexene Alberts, MD;  Location: [facility];  Service: Open Heart Surgery;  Laterality: N/A;    FAMILY HISTORY: Family History  Problem Relation Age of Onset  . Hypertension Mother   . Heart failure Mother     Deceased  . Heart attack Mother   . Hypertension Sister   . Hypertension Brother   . Cerebral aneurysm Father 48    Deceased  . Stroke Neg Hx   . Heart attack Daughter     SOCIAL HISTORY: History   Social History  . Marital Status: Divorced    Spouse Name: N/A  . Number of Children: N/A  . Years of Education: N/A   Occupational History  . Not on file.   Social  History Main Topics  . Smoking status: Former Smoker    Quit date: 07/01/2014  . Smokeless tobacco: Not on file  . Alcohol Use: 0.0 oz/week    0 Standard drinks or equivalent per week  . Drug Use: No  . Sexual Activity: Not on file   Other Topics Concern  . Not on file   Social History Narrative      PHYSICAL EXAM  Filed Vitals:   10/07/14 1004  BP: 136/84  Pulse: 84  Height: 5' 2.5" (1.588 m)  Weight: 196 lb (88.905 kg)   Body mass index is 35.26 kg/(m^2).  Generalized: Well developed, in no acute distress Neck: Her conference 16 inches, Mallampati 4+   Neurological examination  Mentation: Alert oriented to time, place, history taking. Follows all commands speech and language fluent Cranial nerve II-XII: Pupils were equal round reactive to light. Extraocular movements were full, visual field were full on confrontational test. Facial sensation and strength were normal. Uvula tongue midline. Head turning and shoulder shrug  were normal and symmetric. Motor: The motor testing reveals 5 over 5 strength of all 4 extremities. Good symmetric motor tone is noted throughout.  Sensory: Sensory testing is intact to soft  touch on all 4 extremities. No evidence of extinction is noted.  Coordination: Cerebellar testing reveals good finger-nose-finger and heel-to-shin bilaterally.  Gait and station: Gait is normal. Tandem gait is normal. Romberg is negative. No drift is seen.  Reflexes: Deep tendon reflexes are symmetric and normal bilaterally.    DIAGNOSTIC DATA (LABS, IMAGING, TESTING) - I reviewed patient records, labs, notes, testing and imaging myself where available.  Lab Results  Component Value Date   WBC 16.0* 07/12/2014   HGB 10.1* 07/12/2014   HCT 29.9* 07/12/2014   MCV 91.7 07/12/2014   PLT 217 07/12/2014      Component Value Date/Time   NA 133* 07/12/2014 0526   K 4.4 07/12/2014 0526   CL 101 07/12/2014 0526   CO2 32 07/12/2014 0526   GLUCOSE 152* 07/12/2014 0526   BUN 8 07/12/2014 0526   CREATININE 0.57 07/12/2014 0526   CALCIUM 8.4 07/12/2014 0526   PROT 7.0 09/22/2014 1035   ALBUMIN 3.9 09/22/2014 1035   AST 24 09/22/2014 1035   ALT 37* 09/22/2014 1035   ALKPHOS 118* 09/22/2014 1035   BILITOT 0.4 09/22/2014 1035   GFRNONAA >90 07/12/2014 0526   GFRAA >90 07/12/2014 0526   Lab Results  Component Value Date   CHOL 112 09/22/2014   HDL 37.70* 09/22/2014   LDLCALC 51 09/22/2014   TRIG 117.0 09/22/2014   CHOLHDL 3 09/22/2014   Lab Results  Component Value Date   HGBA1C 6.1* 07/08/2014      ASSESSMENT AND PLAN 64 y.o. year old female  has a past medical history of Hypertension; Aneurysm of anterior cerebral artery: Right per MRI 07/01/14 (07/02/2014); CAD (coronary artery disease), native coronary artery: Severe 2 vessel dz per cardiac cath 07/04/14 (07/04/2014); Left atrial mass (07/03/2014); Stroke (07/01/2014); Hyperlipidemia (07/02/2014); S/P resection of left atrial benign papillary fibroelastoma and CABG x 2 (07/10/2014); Papillary fibroelastoma of heart; and echocardiogram. here with:  1. History of stroke 2. Cerebral aneurysm 3. Excessive daytime sleepiness 4.  Snoring  The patient will continue taking aspirin 325 mg daily for secondary stroke prevention. Patient should maintain strict control of her blood pressure with goal less than 130/90. Patient should also maintain strict control of her cholesterol with LDL less than 70. I have sent in a referral  for Hannah Nielsen for evaluation of her aneurysm. I have also referred the patient for a sleep study. I have reviewed with the patient the signs and symptoms of a stroke. Advised the patient that if she develops any of these symptoms she should call 911 immediately. I also encouraged the patient to maintain a healthy diet to promote weight loss as well as regular exercise. Patient verbalized understanding. She will return in 6 months to see Dr. Leonie Man.  Ward Givens, MSN, NP-C 10/07/2014, 10:07 AM [facility] [address], [place], [place] [address] [phone]  Note: This document was prepared with digital dictation and possible smart phrase technology. Any transcriptional errors that result from this process are unintentional.

## 2014-10-07 NOTE — Patient Instructions (Signed)
Continue Aspirin 325 mg daily for stroke prevention Keep BP <130/90 Keep LDL <70 I will refer you to Dr. Ralene Ok for aneurysm I will refer you for a sleep study Our office will call with these appointments. If you have not heard from our office in 1 week please call us.

## 2014-10-08 ENCOUNTER — Encounter (HOSPITAL_COMMUNITY)
Admission: RE | Admit: 2014-10-08 | Discharge: 2014-10-08 | Disposition: A | Discharge status: Home / Self Care | Payer: Self-pay | Source: Ambulatory Visit | Attending: Internal Medicine | Admitting: Internal Medicine

## 2014-10-09 ENCOUNTER — Encounter (HOSPITAL_COMMUNITY)
Admission: RE | Admit: 2014-10-09 | Discharge: 2014-10-09 | Disposition: A | Discharge status: Home / Self Care | Payer: Self-pay | Source: Ambulatory Visit | Attending: Internal Medicine | Admitting: Internal Medicine

## 2014-10-14 ENCOUNTER — Encounter (HOSPITAL_COMMUNITY)
Admission: RE | Admit: 2014-10-14 | Discharge: 2014-10-14 | Disposition: A | Discharge status: Home / Self Care | Payer: Self-pay | Source: Ambulatory Visit | Attending: Internal Medicine | Admitting: Internal Medicine

## 2014-10-14 DIAGNOSIS — Z951 Presence of aortocoronary bypass graft: Secondary | ICD-10-CM | POA: Insufficient documentation

## 2014-10-14 DIAGNOSIS — Z48812 Encounter for surgical aftercare following surgery on the circulatory system: Secondary | ICD-10-CM | POA: Insufficient documentation

## 2014-10-14 DIAGNOSIS — Z8673 Personal history of transient ischemic attack (TIA), and cerebral infarction without residual deficits: Secondary | ICD-10-CM | POA: Insufficient documentation

## 2014-10-14 NOTE — Progress Notes (Signed)
Reviewed home exercise guidelines with patient including endpoints, temperature precautions, target heart rate and rate of perceived exertion. Pt plans to [facility] [employer] [facility]. Pt's goal is weight loss, therefore encouraged to increase duration from 30 to 45 minutes per session. Pt voices understanding of instructions given. Sol Passer, MS, ACSM CCEP

## 2014-10-15 ENCOUNTER — Encounter (HOSPITAL_COMMUNITY)
Admission: RE | Admit: 2014-10-15 | Discharge: 2014-10-15 | Disposition: A | Discharge status: Home / Self Care | Payer: Self-pay | Source: Ambulatory Visit | Attending: Internal Medicine | Admitting: Internal Medicine

## 2014-10-15 ENCOUNTER — Telehealth (HOSPITAL_COMMUNITY): Payer: Self-pay | Admitting: *Deleted

## 2014-10-15 ENCOUNTER — Telehealth: Payer: Self-pay | Admitting: *Deleted

## 2014-10-15 DIAGNOSIS — I10 Essential (primary) hypertension: Secondary | ICD-10-CM

## 2014-10-15 MED ORDER — HYDROCHLOROTHIAZIDE 12.5 MG PO CAPS: 12.5000 mg | ORAL_CAPSULE | Freq: Every day | ORAL | Status: DC

## 2014-10-15 NOTE — Progress Notes (Signed)
Pt in today for cardiac rehab maintenance(non-telemetry). Pt pre exercise bp Left arm 142/96 rck 144 144/90; right arm 142/92.  Reviewed medications, pt verbalized compliance. Pt brought in bp log from home which showed elevated readings. Pt takes amlodipine 2.5 mg daily at bedtime ( due to fatigue noted when she takes it)  Noted in neuro office visit optimum bp 130/90. Pt advised not to exercise today.  Rehab report faxed to Dr. Harrington Challenger for review and input.  Asked pt to bring home bp cuff in to verify accuracy Pt verbalized understanding.  Will follow up on Thursday. Cherre Huger, BSN

## 2014-10-15 NOTE — Telephone Encounter (Signed)
Called and let pt know to expect call from office with medication changes and lab work.  Based upon Dr.Ross response will advise pt to hold on returning to rehab until after she has the opportunity to take the new medication for a couple of days.  Pt verbalized understanding. Cherre Huger, BSN

## 2014-10-15 NOTE — Telephone Encounter (Signed)
-----   Message -----   From: Fay Records, MD   Sent: 10/15/2014  4:13 PM    To: Rowe Pavy, RN  Subject: RE: Elevated readings in Cardiac Rehab maint*   Would prob decrease lopressor to 12.5 1x per day for 1 wk then stop altogether  Add HCTZ 12.5 mg to regimen   Check BMET in 10 days Come in for BP check in 10 days   I spoke with pt and gave her instructions from Dr. Harrington Challenger. Will send new prescription to [facility] on [address].  Pt will come in for BP check and lab work on October 27, 2014.

## 2014-10-16 ENCOUNTER — Telehealth: Payer: Self-pay

## 2014-10-16 ENCOUNTER — Telehealth: Payer: Self-pay | Admitting: Neurology

## 2014-10-16 ENCOUNTER — Encounter (HOSPITAL_COMMUNITY): Payer: Self-pay

## 2014-10-16 NOTE — Telephone Encounter (Signed)
Hannah Nielsen called today to schedule her sleep study with [facility].  I couldn't locate an order for sleep.  I read over her last office visit with Ward Givens and I do see where Jinny Blossom was referring for a sleep study.  Will Dr. Leonie Man put the order in for her to have a sleep study?  Please let the patient know when this happens, she is very concerned about the order and wants to get the sleep study completed.

## 2014-10-16 NOTE — Telephone Encounter (Signed)
Spoke to patient just to confirm showing sleep consult scheduled for 11/19/2014. Patient was grateful and verbalized understanding.

## 2014-10-17 ENCOUNTER — Other Ambulatory Visit: Payer: Self-pay | Admitting: Thoracic Surgery (Cardiothoracic Vascular Surgery)

## 2014-10-17 DIAGNOSIS — D151 Benign neoplasm of heart: Secondary | ICD-10-CM

## 2014-10-20 ENCOUNTER — Ambulatory Visit
Admission: RE | Admit: 2014-10-20 | Discharge: 2014-10-20 | Disposition: A | Discharge status: Home / Self Care | Payer: Medicare PPO | Source: Ambulatory Visit | Attending: Thoracic Surgery (Cardiothoracic Vascular Surgery) | Admitting: Thoracic Surgery (Cardiothoracic Vascular Surgery)

## 2014-10-20 ENCOUNTER — Encounter: Payer: Self-pay | Admitting: Thoracic Surgery (Cardiothoracic Vascular Surgery)

## 2014-10-20 ENCOUNTER — Ambulatory Visit (INDEPENDENT_AMBULATORY_CARE_PROVIDER_SITE_OTHER): Payer: Medicare PPO | Admitting: Thoracic Surgery (Cardiothoracic Vascular Surgery)

## 2014-10-20 VITALS — BP 150/88 | HR 100 | Resp 20 | Ht 62.5 in | Wt 195.0 lb

## 2014-10-20 DIAGNOSIS — I5189 Other ill-defined heart diseases: Secondary | ICD-10-CM

## 2014-10-20 DIAGNOSIS — R222 Localized swelling, mass and lump, trunk: Secondary | ICD-10-CM

## 2014-10-20 DIAGNOSIS — Z951 Presence of aortocoronary bypass graft: Secondary | ICD-10-CM

## 2014-10-20 DIAGNOSIS — D151 Benign neoplasm of heart: Secondary | ICD-10-CM

## 2014-10-20 NOTE — Progress Notes (Signed)
[address]       [place] [address]             [phone]     CARDIOTHORACIC SURGERY OFFICE NOTE  Referring Provider is Jerline Pain, MD  Primary Cardiologist is Fay Records, MD PCP is [name], MD   HPI:  Patient returns for routine follow-up status post resection of left atrial myxoma, coronary artery bypass grafting 2, and closure of patent foramen ovale on 07/10/2014. She originally presented with a minor stroke on 07/01/2014.  Her postoperative recovery was entirely uncomplicated and she was seen most recently in our office on 08/11/2014. Since then she has been seen in follow-up by Dr. Harrington Challenger and Ward Givens at [facility].  The patient returns to our office for routine follow-up today. She has been doing exceptionally well. She has been participating in the outpatient cardiac rehabilitation program and making reasonably good progress with improving exercise tolerance. She did have one episode when her blood pressure was running somewhat high and her antihypertension medications were adjusted. She reports that she feels quite well. She denies any symptoms of exertional chest pain or chest tightness. She has not had any palpitations or dizzy spells. Overall she feels well and has no complaints.   Current Outpatient Prescriptions  Medication Sig Dispense Refill  . amLODipine (NORVASC) 2.5 MG tablet Take 2.5 mg by mouth at bedtime.     Marland Kitchen aspirin EC 325 MG EC tablet Take 1 tablet (325 mg total) by mouth daily. 30 tablet 0  . atorvastatin (LIPITOR) 80 MG tablet Take 1 tablet (80 mg total) by mouth daily at 6 PM. 30 tablet 1  . CALCIUM PO Take 1 tablet by mouth daily.    . Cholecalciferol (VITAMIN D-3) 1000 UNITS CAPS Take 1,000 Units by mouth daily.    Marland Kitchen docusate sodium (COLACE) 100 MG capsule Take 100 mg by mouth daily.    . hydrochlorothiazide (MICROZIDE) 12.5 MG capsule Take 1 capsule (12.5 mg total) by mouth daily. 30 capsule 6  . KRILL OIL PO  Take 1 tablet by mouth daily.    . LUTEIN PO Take 1 tablet by mouth daily.    . Multiple Vitamins-Minerals (EYE VITAMINS PO) Take 1 tablet by mouth daily.    . Multiple Vitamins-Minerals (MULTIVITAL) tablet Take 1 tablet by mouth daily.    Marland Kitchen oxybutynin (DITROPAN-XL) 10 MG 24 hr tablet Take 10 mg by mouth at bedtime.    . Probiotic Product (PROBIOTIC DAILY PO) Take 1 capsule by mouth daily.    . metoprolol tartrate (LOPRESSOR) 25 MG tablet      No current facility-administered medications for this visit.      Physical Exam:   BP 150/88 mmHg  Pulse 100  Resp 20  Ht 5' 2.5" (1.588 m)  Wt 195 lb (88.451 kg)  BMI 35.08 kg/m2  SpO2   General:  Well-appearing  Chest:   clear  CV:   Regular rate and rhythm  Incisions:  Completely healed, sternum is stable  Abdomen:  Soft and nontender  Extremities:  Warm and well-perfused  Diagnostic Tests:  CHEST 2 VIEW  COMPARISON: CT 08/19/2014.  FINDINGS: Prior CABG. Mediastinum hilar structures normal. Heart size normal. Lungs are clear. No pleural effusion or pneumothorax.  IMPRESSION: Prior CABG. Heart size normal. No acute cardiopulmonary disease.   Electronically Signed  By: Marcello Moores Register  On: 10/20/2014 14:11    Impression:  Patient is doing very well more than  3 months status post resection of left atrial myxoma, coronary artery bypass grafting 2, and closure of patent foramen ovale.  Plan:  I have encouraged the patient to continue to increase her physical activity as tolerated without any particular limitations related to her surgery. We have not recommended any changes to her current medications at this time.  She will continue to follow-up with Dr. Felipa Eth and Dr. Harrington Challenger for long-term management of hypertension. She has been referred for neurosurgical evaluation due to the presence of a small cerebral aneurysm noted on previous MRI of the brain. She will return for routine follow-up next spring,  approximately 1 year following her original surgery.   I spent in excess of 10 minutes during the conduct of this office consultation and >50% of this time involved direct face-to-face encounter with the patient for counseling and/or coordination of their care.   Valentina Gu. Roxy Manns, MD 10/20/2014 2:38 PM

## 2014-10-20 NOTE — Patient Instructions (Signed)
The patient should make every effort to continue to avoid smoking permanently.  Patient may resume unrestricted physical activity without any particular limitations at this time.  The patient is reminded of the numerous potential long-term benefits of regular exercise and adherence to a "heart healthy" diet

## 2014-10-21 ENCOUNTER — Encounter (HOSPITAL_COMMUNITY)
Admission: RE | Admit: 2014-10-21 | Discharge: 2014-10-21 | Disposition: A | Discharge status: Home / Self Care | Payer: Self-pay | Source: Ambulatory Visit | Attending: Internal Medicine | Admitting: Internal Medicine

## 2014-10-22 ENCOUNTER — Encounter (HOSPITAL_COMMUNITY)
Admission: RE | Admit: 2014-10-22 | Discharge: 2014-10-22 | Disposition: A | Discharge status: Home / Self Care | Payer: Self-pay | Source: Ambulatory Visit | Attending: Internal Medicine | Admitting: Internal Medicine

## 2014-10-23 ENCOUNTER — Other Ambulatory Visit: Payer: Self-pay | Admitting: Neurosurgery

## 2014-10-23 ENCOUNTER — Other Ambulatory Visit (HOSPITAL_COMMUNITY): Payer: Self-pay | Admitting: Neurosurgery

## 2014-10-23 ENCOUNTER — Encounter (HOSPITAL_COMMUNITY): Payer: Self-pay

## 2014-10-23 DIAGNOSIS — I671 Cerebral aneurysm, nonruptured: Secondary | ICD-10-CM

## 2014-10-24 ENCOUNTER — Encounter (HOSPITAL_COMMUNITY)
Admission: RE | Admit: 2014-10-24 | Discharge: 2014-10-24 | Disposition: A | Discharge status: Home / Self Care | Payer: Self-pay | Source: Ambulatory Visit | Attending: Internal Medicine | Admitting: Internal Medicine

## 2014-10-27 ENCOUNTER — Other Ambulatory Visit (INDEPENDENT_AMBULATORY_CARE_PROVIDER_SITE_OTHER): Payer: Medicare PPO | Admitting: *Deleted

## 2014-10-27 ENCOUNTER — Ambulatory Visit (INDEPENDENT_AMBULATORY_CARE_PROVIDER_SITE_OTHER): Payer: Medicare PPO | Admitting: *Deleted

## 2014-10-27 VITALS — BP 128/72 | HR 84 | Wt 198.0 lb

## 2014-10-27 DIAGNOSIS — I10 Essential (primary) hypertension: Secondary | ICD-10-CM

## 2014-10-27 LAB — BASIC METABOLIC PANEL
BUN: 17 mg/dL (ref 6–23)
CO2: 29 meq/L (ref 19–32)
Calcium: 10 mg/dL (ref 8.4–10.5)
Chloride: 102 mEq/L (ref 96–112)
Creatinine, Ser: 0.74 mg/dL (ref 0.40–1.20)
GFR: 83.67 mL/min (ref >60.00)
Glucose, Bld: 89 mg/dL (ref 70–99)
Potassium: 4 mEq/L (ref 3.5–5.1)
Sodium: 139 mEq/L (ref 135–145)

## 2014-10-27 NOTE — Progress Notes (Signed)
1.) Reason for visit: BP Check  2.) Name of MD requesting visit: Dr. Dorris Carnes  3.) H&P: HTN, Stroke, CAD  4.) Assessment and plan per MD: Vitals- BP 128/72, HR 84, O2- 97%, Wt 198lbs.  Pt denies any CP, SOB, headache, lightheadedness or dizziness.  Pt states BP this morning prior to taking medications was 124/91. Pt had blood work drawn while here.  Plan is to continue current medications and Dr. Harrington Challenger will review blood work once received.

## 2014-10-27 NOTE — Addendum Note (Signed)
Addended by: Eulis Foster on: 10/27/2014 08:53 AM   Modules accepted: Orders

## 2014-10-27 NOTE — Patient Instructions (Signed)
Medication Instructions:  None  Labwork: BMET today  Testing/Procedures: None  Follow-Up:   Any Other Special Instructions Will Be Listed Below (If Applicable).  You will receive a phone call from our office in regards to your blood work

## 2014-10-28 ENCOUNTER — Encounter (HOSPITAL_COMMUNITY)
Admission: RE | Admit: 2014-10-28 | Discharge: 2014-10-28 | Disposition: A | Discharge status: Home / Self Care | Payer: Self-pay | Source: Ambulatory Visit | Attending: Internal Medicine | Admitting: Internal Medicine

## 2014-10-29 ENCOUNTER — Encounter (HOSPITAL_COMMUNITY): Payer: Self-pay

## 2014-10-30 ENCOUNTER — Encounter (HOSPITAL_COMMUNITY)
Admission: RE | Admit: 2014-10-30 | Discharge: 2014-10-30 | Disposition: A | Discharge status: Home / Self Care | Payer: Self-pay | Source: Ambulatory Visit | Attending: Internal Medicine | Admitting: Internal Medicine

## 2014-11-04 ENCOUNTER — Encounter (HOSPITAL_COMMUNITY)
Admission: RE | Admit: 2014-11-04 | Discharge: 2014-11-04 | Disposition: A | Discharge status: Home / Self Care | Payer: Self-pay | Source: Ambulatory Visit | Attending: Internal Medicine | Admitting: Internal Medicine

## 2014-11-05 ENCOUNTER — Encounter (HOSPITAL_COMMUNITY)
Admission: RE | Admit: 2014-11-05 | Discharge: 2014-11-05 | Disposition: A | Discharge status: Home / Self Care | Payer: Self-pay | Source: Ambulatory Visit | Attending: Internal Medicine | Admitting: Internal Medicine

## 2014-11-06 ENCOUNTER — Encounter (HOSPITAL_COMMUNITY): Payer: Self-pay

## 2014-11-11 ENCOUNTER — Encounter (HOSPITAL_COMMUNITY): Payer: Self-pay

## 2014-11-12 ENCOUNTER — Encounter (HOSPITAL_COMMUNITY): Payer: Self-pay

## 2014-11-13 ENCOUNTER — Encounter (HOSPITAL_COMMUNITY): Payer: Self-pay

## 2014-11-18 ENCOUNTER — Encounter (HOSPITAL_COMMUNITY): Payer: Self-pay

## 2014-11-18 ENCOUNTER — Other Ambulatory Visit (HOSPITAL_COMMUNITY): Payer: Self-pay | Admitting: Neurosurgery

## 2014-11-18 ENCOUNTER — Ambulatory Visit (HOSPITAL_COMMUNITY)
Admission: RE | Admit: 2014-11-18 | Discharge: 2014-11-18 | Disposition: A | Discharge status: Home / Self Care | Payer: Medicare PPO | Source: Ambulatory Visit | Attending: Neurosurgery | Admitting: Neurosurgery

## 2014-11-18 DIAGNOSIS — I10 Essential (primary) hypertension: Secondary | ICD-10-CM | POA: Insufficient documentation

## 2014-11-18 DIAGNOSIS — Z882 Allergy status to sulfonamides status: Secondary | ICD-10-CM | POA: Diagnosis not present

## 2014-11-18 DIAGNOSIS — I671 Cerebral aneurysm, nonruptured: Secondary | ICD-10-CM

## 2014-11-18 DIAGNOSIS — Z8673 Personal history of transient ischemic attack (TIA), and cerebral infarction without residual deficits: Secondary | ICD-10-CM | POA: Diagnosis not present

## 2014-11-18 DIAGNOSIS — Z7982 Long term (current) use of aspirin: Secondary | ICD-10-CM | POA: Diagnosis not present

## 2014-11-18 DIAGNOSIS — Z87891 Personal history of nicotine dependence: Secondary | ICD-10-CM | POA: Insufficient documentation

## 2014-11-18 DIAGNOSIS — E785 Hyperlipidemia, unspecified: Secondary | ICD-10-CM | POA: Insufficient documentation

## 2014-11-18 DIAGNOSIS — Z951 Presence of aortocoronary bypass graft: Secondary | ICD-10-CM | POA: Diagnosis not present

## 2014-11-18 DIAGNOSIS — I2510 Atherosclerotic heart disease of native coronary artery without angina pectoris: Secondary | ICD-10-CM | POA: Diagnosis not present

## 2014-11-18 LAB — BASIC METABOLIC PANEL
Anion gap: 8 (ref 5–15)
BUN: 16 mg/dL (ref 6–20)
CALCIUM: 9.1 mg/dL (ref 8.9–10.3)
CO2: 25 mmol/L (ref 22–32)
CREATININE: 0.7 mg/dL (ref 0.44–1.00)
Chloride: 103 mmol/L (ref 101–111)
GFR calc Af Amer: >60 mL/min (ref >60)
GFR calc non Af Amer: >60 mL/min (ref >60)
Glucose, Bld: 103 mg/dL — ABNORMAL HIGH (ref 65–99)
POTASSIUM: 3.9 mmol/L (ref 3.5–5.1)
SODIUM: 136 mmol/L (ref 135–145)

## 2014-11-18 LAB — CBC WITH DIFFERENTIAL/PLATELET
Basophils Absolute: 0.1 10*3/uL (ref 0.0–0.1)
Basophils Relative: 1 % (ref 0–1)
Eosinophils Absolute: 0.2 10*3/uL (ref 0.0–0.7)
Eosinophils Relative: 2 % (ref 0–5)
HCT: 43 % (ref 36.0–46.0)
Hemoglobin: 14.6 g/dL (ref 12.0–15.0)
LYMPHS ABS: 2.6 10*3/uL (ref 0.7–4.0)
LYMPHS PCT: 25 % (ref 12–46)
MCH: 29.7 pg (ref 26.0–34.0)
MCHC: 34 g/dL (ref 30.0–36.0)
MCV: 87.4 fL (ref 78.0–100.0)
MONO ABS: 0.9 10*3/uL (ref 0.1–1.0)
Monocytes Relative: 8 % (ref 3–12)
Neutro Abs: 6.4 10*3/uL (ref 1.7–7.7)
Neutrophils Relative %: 64 % (ref 43–77)
PLATELETS: 324 10*3/uL (ref 150–400)
RBC: 4.92 MIL/uL (ref 3.87–5.11)
RDW: 14.1 % (ref 11.5–15.5)
WBC: 10.1 10*3/uL (ref 4.0–10.5)

## 2014-11-18 LAB — PROTIME-INR
INR: 1 (ref 0.00–1.49)
PROTHROMBIN TIME: 13.4 s (ref 11.6–15.2)

## 2014-11-18 LAB — APTT: aPTT: 31 seconds (ref 24–37)

## 2014-11-18 MED ORDER — SODIUM CHLORIDE 0.9 % IV SOLN
INTRAVENOUS | Status: AC | PRN
  Administered 2014-11-18: 10 mL/h via INTRAVENOUS

## 2014-11-18 MED ORDER — MIDAZOLAM HCL 2 MG/2ML IJ SOLN
INTRAMUSCULAR | Status: AC | PRN
  Administered 2014-11-18: 0.5 mg via INTRAVENOUS

## 2014-11-18 MED ORDER — IOHEXOL 300 MG/ML  SOLN
70.0000 mL | Freq: Once | INTRAMUSCULAR | Status: DC | PRN
  Administered 2014-11-18: 200 mL via INTRAVENOUS
  Filled 2014-11-18: qty 70

## 2014-11-18 MED ORDER — HEPARIN SODIUM (PORCINE) 1000 UNIT/ML IJ SOLN
INTRAMUSCULAR | Status: AC | PRN
  Administered 2014-11-18: 2000 [IU] via INTRAVENOUS

## 2014-11-18 MED ORDER — FENTANYL CITRATE (PF) 100 MCG/2ML IJ SOLN
INTRAMUSCULAR | Status: AC | PRN
  Administered 2014-11-18: 25 ug via INTRAVENOUS

## 2014-11-18 MED ORDER — HYDROCODONE-ACETAMINOPHEN 5-325 MG PO TABS: 1.0000 | ORAL_TABLET | ORAL | Status: DC | PRN

## 2014-11-18 MED ORDER — SODIUM CHLORIDE 0.9 % IV SOLN: INTRAVENOUS | Status: DC

## 2014-11-18 MED ORDER — FENTANYL CITRATE (PF) 100 MCG/2ML IJ SOLN
INTRAMUSCULAR | Status: AC
  Filled 2014-11-18: qty 2

## 2014-11-18 MED ORDER — MIDAZOLAM HCL 2 MG/2ML IJ SOLN
INTRAMUSCULAR | Status: AC
  Filled 2014-11-18: qty 2

## 2014-11-18 MED ORDER — LIDOCAINE HCL 1 % IJ SOLN
INTRAMUSCULAR | Status: AC
  Filled 2014-11-18: qty 20

## 2014-11-18 MED ORDER — HEPARIN SOD (PORK) LOCK FLUSH 100 UNIT/ML IV SOLN
INTRAVENOUS | Status: AC
  Filled 2014-11-18: qty 20

## 2014-11-18 NOTE — Discharge Instructions (Signed)

## 2014-11-18 NOTE — H&P (Signed)
CC:  Aneurysm  HPI: [name] is a 64 y.o. woman seen initially in the office. She comes in after suffering a stroke involving numbness of the left hand primarily about 3 months ago. 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 Cardiac catheterization also demonstrated coronary artery disease, and she underwent open heart surgery with bypass for correction of the fibroblastoma, as well as the coronary artery disease. During workup, she was also found to [facility] and incidental anterior communicating artery aneurysm, and was therefore referred for neurosurgical evaluation. Of note, the patient does have a history of tobacco smoking, but she has been able to quit since her admission to the hospital in March. She does have a history of hypertension, which is currently managed medically. She does have a strong family history of intracranial aneurysms, including her mother, and grandmother.   PMH: Past Medical History  Diagnosis Date  . Hypertension   . 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 CAD (coronary artery disease), native coronary artery: Severe 2 vessel dz per cardiac cath 07/04/14 07/04/2014  . Left atrial mass 07/03/2014    benign papillary fibroelastoma  . Stroke 0/81/4481    acute embolic stroke to right MCA territory  . Hyperlipidemia 07/02/2014  . S/P resection of left atrial benign papillary fibroelastoma and CABG x 2 07/10/2014    LIMA to LAD, SVG to RCA, EVH via right thigh  . Papillary fibroelastoma of heart   . Hx of echocardiogram     Echo 5/16:  EF 60-65%, no RWMA    PSH: Past Surgical History  Procedure Laterality Date  . Abdominal hysterectomy    . Left heart catheterization with coronary angiogram N/A 07/04/2014    Procedure: LEFT HEART CATHETERIZATION WITH CORONARY ANGIOGRAM;  Surgeon: Jettie Booze, MD;  Location: [facility] CATH  LAB;  Service: Cardiovascular;  Laterality: N/A;  . Tee without cardioversion N/A 07/03/2014    Procedure: TRANSESOPHAGEAL ECHOCARDIOGRAM (TEE);  Surgeon: Jerline Pain, MD;  Location: [facility] ENDOSCOPY;  Service: Cardiovascular;  Laterality: N/A;  . Coronary artery bypass graft N/A 07/10/2014    Procedure: CORONARY ARTERY BYPASS GRAFTING (CABG), ON PUMP, TIMES TWO, USING LEFT INTERNAL MAMMARY ARTERY, RIGHT GREATER SAPHENOUS VEIN HARVESTED ENDOSCOPICALLY;  Surgeon: Rexene Alberts, MD;  Location: [place];  Service: Open Heart Surgery;  Laterality: N/A;  . Excision of atrial myxoma N/A 07/10/2014    Procedure: RESECTION OF LEFT ATRIAL MASS;  Surgeon: Rexene Alberts, MD;  Location: [place];  Service: Open Heart Surgery;  Laterality: N/A;  . Tee without cardioversion N/A 07/10/2014    Procedure: TRANSESOPHAGEAL ECHOCARDIOGRAM (TEE);  Surgeon: Rexene Alberts, MD;  Location: [place];  Service: Open Heart Surgery;  Laterality: N/A;  . Repair of patent foramen ovale N/A 07/10/2014    Procedure: REPAIR OF PATENT FORAMEN OVALE;  Surgeon: Rexene Alberts, MD;  Location: [place];  Service: Open Heart Surgery;  Laterality: N/A;    SH: History  Substance Use Topics  . Smoking status: Former Smoker    Quit date: 07/01/2014  . Smokeless tobacco: Never Used  . Alcohol Use: 0.0 oz/week    0 Glasses of wine per week     Comment: rare    MEDS: Prior to Admission medications   Medication Sig Start Date End Date Taking? Authorizing Provider  amLODipine (NORVASC) 2.5 MG tablet  Take 2.5 mg by mouth at bedtime.    Yes Historical Provider, MD  aspirin EC 325 MG EC tablet Take 1 tablet (325 mg total) by mouth daily. 07/14/14  Yes Donielle Liston Alba, PA-C  atorvastatin (LIPITOR) 80 MG tablet Take 1 tablet (80 mg total) by mouth daily at 6 PM. 07/14/14  Yes Donielle Liston Alba, PA-C  CALCIUM PO Take 1 tablet by mouth daily.   Yes Historical Provider, MD  Cholecalciferol (VITAMIN D-3) 1000 UNITS CAPS Take 1,000 Units by mouth  daily.   Yes Historical Provider, MD  docusate sodium (COLACE) 100 MG capsule Take 100 mg by mouth daily.   Yes Historical Provider, MD  hydrochlorothiazide (MICROZIDE) 12.5 MG capsule Take 1 capsule (12.5 mg total) by mouth daily. 10/15/14  Yes Fay Records, MD  ibuprofen (ADVIL,MOTRIN) 200 MG tablet Take 200 mg by mouth every 6 (six) hours as needed for headache, mild pain or moderate pain.   Yes Historical Provider, MD  KRILL OIL PO Take 1 tablet by mouth daily.   Yes Historical Provider, MD  LUTEIN PO Take 1 tablet by mouth daily.   Yes Historical Provider, MD  Multiple Vitamins-Minerals (EYE VITAMINS PO) Take 1 tablet by mouth daily.   Yes Historical Provider, MD  Multiple Vitamins-Minerals (MULTIVITAL) tablet Take 1 tablet by mouth daily.   Yes Historical Provider, MD  nicotine polacrilex (NICOTINE MINI) 2 MG lozenge Take 2 mg by mouth as needed for smoking cessation.   Yes Historical Provider, MD  oxybutynin (DITROPAN-XL) 10 MG 24 hr tablet Take 10 mg by mouth at bedtime.   Yes Historical Provider, MD  Probiotic Product (PROBIOTIC DAILY PO) Take 1 capsule by mouth daily.   Yes Historical Provider, MD    ALLERGY: Allergies  Allergen Reactions  . Sulfa Antibiotics     Childhood allergy    ROS: ROS  NEUROLOGIC EXAM: Awake, alert, oriented Memory and concentration grossly intact Speech fluent, appropriate CN grossly intact Motor exam: Upper Extremities Deltoid Bicep Tricep Grip  Right 5/5 5/5 5/5 5/5  Left 5/5 5/5 5/5 5/5   Lower Extremity IP Quad PF DF EHL  Right 5/5 5/5 5/5 5/5 5/5  Left 5/5 5/5 5/5 5/5 5/5   Sensation grossly intact to LT  IMPRESSION: - 64 y.o. female with incidentally discovered right ACA aneurysm  PLAN: - Proceed with diagnostic cerebral angiogram - Likely home post-procedure  A comprehensive discussion was had with the patient and her daughter totaling approximately 30 minutes. The natural history of cerebral aneurysms was reviewed in detail, with  a 1-2% per year risk of rupture. The general treatment options were also discussed, with the need for definitive diagnosis by catheter angiogram. The risks of the angiogram procedure were reviewed, including a 0.1% risk of stroke, and overal risk of approximately 1% including but not limited to groin hematoma, headache, contrast reaction, and nephropathy.  The patient understood our discussion and is willing to proceed as above. All questions were answered.

## 2014-11-18 NOTE — Op Note (Signed)
DIAGNOSTIC CEREBRAL ANGIOGRAM    OPERATOR:   Dr. Consuella Lose, MD  HISTORY:   The patient is a 64 y.o. yo female with an incidentally discovered right ACA aneurysm. She now presents for further workup with diagnostic cerebral angiogram.  APPROACH:   The technical aspects of the procedure as well as its potential risks and benefits were reviewed with the patient. These risks included but were not limited bleeding, infection, allergic reaction, damage to organs/vital structures, stroke, non-diagnostic procedure, and the catastrophic outcomes of heart attack, coma, and death. With an understanding of these risks, informed consent was obtained and witnessed.    The patient was placed in the supine position on the angiography table and the skin of right groin prepped in the usual sterile fashion. The procedure was performed under local anesthesia (1%-solution of bicarbonate-bufferred Lidoacaine) and conscious sedation with Versed and fentanyl monitored by the in-suite nurse.    A 5- French sheath was introduced in the right common femoral artery using Seldinger technique.  A fluorophase sequence was used to document the sheath position.    HEPARIN: 2000 Units total.   CONTRAST AGENT: 130cc Omnipaque 300   FLUOROSCOPY TIME: 11.8 Combined AP and lateral minutes    CATHETER(S) AND WIRE(S):    5-French Pigtail 5-French JB-1 glidecatheter   0.035" glidewire    VESSELS CATHETERIZED:   Right common carotid   Left common carotid   Right vertebral   Left vertebral   Right common femoral  VESSELS STUDIED:   Aortic arch Right common carotid, neck Right common carotid, head Right vertebral Left common carotid, neck Left common carotid, head Left vertebral Right femoral  PROCEDURAL NARRATIVE:  The Pigtail catheter was introduced over the guidewire and placed in the ascending Aorta. Aortogram was then taken. The pigtail was then removed without incident.   A 5-Fr JB-1 terumo glide  catheter was advanced over a 0.035 glidewire into the aortic arch. The above vessels were then sequentially catheterized and cervical/cerebral angiograms taken. After review of images, the catheter was removed without incident.    INTERPRETATION:   Aortic arch:    Type I, normal three vessel arch configuration. No significant ostial stenosis.   Right common carotid: neck:   There is atherosclerotic disease involving the origin of the right internal carotid artery in the neck. There is less than 50% stenosis, without any flow limitation.   Right common carotid: head:   Injection reveals the presence of a widely patent ICA, M1, and A1 segments and their branches. There isn't inferiorly and medially projecting aneurysm arising from the right A1-A2 junction. This aneurysm measures approximately 3.0 x 2.8 x 2.6 mm.  The parenchymal and venous phases are normal. The venous sinuses are widely patent.    Left common carotid: neck:   There is calcific atherosclerotic plaque involving the origin of the left internal carotid artery resulting in <50% stenosis and no flow limitation.  Left common carotid: head:   Injection reveals the presence of a widely patent ICA, A1, and M1 segments and their branches. There is no significant stenosis, occlusion, aneurysm, or high flow vascular malformation visualized. The above-described right A1-A2 junction aneurysm is not visualized from this left-sided injection. The parenchymal and venous phases are normal. The venous sinuses are widely patent.    Left vertebral:   Injection reveals the presence of a widely patent vertebral artery. This leads to a widely patent basilar artery that terminates in bilateral P1. The basilar apex is normal. There is no  significant stenosis, occlusion, aneurysm, or vascular malformation visualized. The parenchymal and venous phases are normal. The venous sinuses are widely patent.    Right vertebral:    Normal vessel. No PICA aneurysm.  See basilar description above.    Right femoral:    Normal vessel. No significant atherosclerotic disease. Arterial sheath in adequate position.   DISPOSITION:  Upon completion of the study, the femoral sheath was removed and hemostasis obtained using a 5-Fr ExoSeal closure device. Good proximal and distal lower extremity pulses were documented upon achievement of hemostasis.    The procedure was well tolerated and no early complications were observed.       The patient was transferred back to the holding area to be positioned flat in bed for 3 hours of observation.    IMPRESSION:  1. Approximately 3 mm right A1-A2 junction aneurysm as described above.  The preliminary results of this procedure were shared with the patient and the patient's family.

## 2014-11-19 ENCOUNTER — Encounter (HOSPITAL_COMMUNITY): Payer: Self-pay

## 2014-11-19 ENCOUNTER — Institutional Professional Consult (permissible substitution): Payer: PRIVATE HEALTH INSURANCE | Admitting: Neurology

## 2014-11-20 ENCOUNTER — Encounter (HOSPITAL_COMMUNITY): Payer: Self-pay

## 2014-11-25 ENCOUNTER — Encounter (HOSPITAL_COMMUNITY): Payer: Self-pay

## 2014-11-26 ENCOUNTER — Encounter (HOSPITAL_COMMUNITY): Payer: Self-pay

## 2014-11-26 DIAGNOSIS — I671 Cerebral aneurysm, nonruptured: Secondary | ICD-10-CM | POA: Diagnosis not present

## 2014-11-26 DIAGNOSIS — I10 Essential (primary) hypertension: Secondary | ICD-10-CM | POA: Diagnosis not present

## 2014-11-26 DIAGNOSIS — Z6836 Body mass index (BMI) 36.0-36.9, adult: Secondary | ICD-10-CM | POA: Diagnosis not present

## 2014-11-27 ENCOUNTER — Encounter (HOSPITAL_COMMUNITY): Payer: Self-pay

## 2014-11-27 ENCOUNTER — Ambulatory Visit: Payer: Self-pay | Admitting: Neurology

## 2014-11-28 ENCOUNTER — Other Ambulatory Visit: Payer: Self-pay | Admitting: Neurosurgery

## 2014-11-28 ENCOUNTER — Other Ambulatory Visit (HOSPITAL_COMMUNITY): Payer: Self-pay | Admitting: Neurosurgery

## 2014-11-28 DIAGNOSIS — I671 Cerebral aneurysm, nonruptured: Secondary | ICD-10-CM

## 2014-12-02 ENCOUNTER — Encounter (HOSPITAL_COMMUNITY): Payer: Self-pay

## 2014-12-03 ENCOUNTER — Encounter (HOSPITAL_COMMUNITY): Payer: Self-pay

## 2014-12-04 ENCOUNTER — Encounter (HOSPITAL_COMMUNITY): Payer: Self-pay

## 2014-12-09 ENCOUNTER — Encounter: Payer: Self-pay | Admitting: Neurology

## 2014-12-09 ENCOUNTER — Encounter (HOSPITAL_COMMUNITY): Payer: Self-pay

## 2014-12-09 ENCOUNTER — Ambulatory Visit (INDEPENDENT_AMBULATORY_CARE_PROVIDER_SITE_OTHER): Payer: Medicare PPO | Admitting: Neurology

## 2014-12-09 VITALS — BP 144/88 | HR 78 | Resp 16 | Ht 62.5 in | Wt 200.0 lb

## 2014-12-09 DIAGNOSIS — Z8249 Family history of ischemic heart disease and other diseases of the circulatory system: Secondary | ICD-10-CM | POA: Diagnosis not present

## 2014-12-09 DIAGNOSIS — R351 Nocturia: Secondary | ICD-10-CM

## 2014-12-09 DIAGNOSIS — Z8673 Personal history of transient ischemic attack (TIA), and cerebral infarction without residual deficits: Secondary | ICD-10-CM

## 2014-12-09 DIAGNOSIS — Z951 Presence of aortocoronary bypass graft: Secondary | ICD-10-CM

## 2014-12-09 DIAGNOSIS — G4733 Obstructive sleep apnea (adult) (pediatric): Secondary | ICD-10-CM

## 2014-12-09 DIAGNOSIS — I671 Cerebral aneurysm, nonruptured: Secondary | ICD-10-CM

## 2014-12-09 DIAGNOSIS — I6930 Unspecified sequelae of cerebral infarction: Secondary | ICD-10-CM

## 2014-12-09 NOTE — Patient Instructions (Signed)

## 2014-12-09 NOTE — Progress Notes (Signed)
Subjective:    Patient ID: Hannah Nielsen is a 64 y.o. female.  HPI     Star Age, MD, PhD [facility] [address], [address] [place], [place] [address]  Dear Jinny Blossom and Mamie Nick,   I saw your patient, Hannah Nielsen, upon your kind request in my clinic today for initial consultation of her sleep disorder, in particular, concern for underlying obstructive sleep apnea. The patient is unaccompanied today. As you know, Hannah Nielsen is a 64 year old right-handed woman with an underlying medical history of hypertension, ACA aneurysm (scheduled for elective coiling under Dr. [name] on 12/23/14), coronary artery disease, status post 2 vessel CABG on 07/10/2014, papillary fibroblastoma of the heart, stroke in March 2016, hyperlipidemia, recent smoking cessation, and obesity, who reports snoring, witnessed apneas per daughter (recently shared a hotel room) and excessive daytime somnolence. As far as her stroke is concerned. She had woken up with a numbness and weakness of her left hand. Her weakness has improved and she has residual numbness in her left hand, lateral aspect. She is using nicotine lozenges and has quit smoking on 07/01/2014 at the time of her stroke diagnosis. She drinks alcohol very occasionally. She drinks coffee 2 cups a day and no sodas and typically no other caffeine-containing beverages. She tries to drink water. She has no known family history of obstructive sleep apnea but does have a family history of early or sudden death. Her father snored heavily. She reports a bedtime of around 9 or 10 PM and a rise time around 4:30 or 5. She typically does not wake up rested and her Epworth sleepiness score is elevated at 15 out of 24 today, her fatigue score is 42 out of 63. She lives alone. 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 She is divorced 2. She has 3 grown children from her first marriage. She has a family history of brain aneurysm in her  father who died when she was 55 years old from a ruptured brain aneurysm and his mother, her paternal grandmother had a brain aneurysm as well. Dr. Dorris Carnes is her cardiologist, Dr. Roxy Manns is her CT surgeon. She denies frank restless leg symptoms and is not known to twitch her legs and her sleep. She does wake up frequently in the middle of the night. She has to use the bathroom on average 2-3 times per night.  Her Past Medical History Is Significant For: Past Medical History  Diagnosis Date  . Hypertension   . 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 CAD (coronary artery disease), native coronary artery: Severe 2 vessel dz per cardiac cath 07/04/14 07/04/2014  . Left atrial mass 07/03/2014    benign papillary fibroelastoma  . Stroke 10/08/4763    acute embolic stroke to right MCA territory  . Hyperlipidemia 07/02/2014  . S/P resection of left atrial benign papillary fibroelastoma and CABG x 2 07/10/2014    LIMA to LAD, SVG to RCA, EVH via right thigh  . Papillary fibroelastoma of heart   . Hx of echocardiogram     Echo 5/16:  EF 60-65%, no RWMA    Her Past Surgical History Is Significant For: Past Surgical History  Procedure Laterality Date  . Abdominal hysterectomy    . Left heart catheterization with coronary angiogram N/A 07/04/2014    Procedure: LEFT HEART CATHETERIZATION WITH CORONARY ANGIOGRAM;  Surgeon: Jettie Booze, MD;  Location[facility] C[facility] LAB;  Service: Cardiovascular;  Laterality: N/A;  . Tee without cardioversion N/A 07/03/2014    Procedure: TRANSESOPHAGEAL ECHOCARDIOGRAM (TEE);  Surgeon: Jerline Pain, MD;  Location: [facility] ENDOSCOPY;  Service: Cardiovascular;  Laterality: N/A;  . Coronary artery bypass graft N/A 07/10/2014    Procedure: CORONARY ARTERY BYPASS GRAFTING (CABG), ON PUMP, TIMES TWO, USING LEFT INTERNAL MAMMARY ARTERY, RIGHT GREATER SAPHENOUS VEIN HARVESTED ENDOSCOPICALLY;  Surgeon: Rexene Alberts, MD;  Location: [place];   Service: Open Heart Surgery;  Laterality: N/A;  . Excision of atrial myxoma N/A 07/10/2014    Procedure: RESECTION OF LEFT ATRIAL MASS;  Surgeon: Rexene Alberts, MD;  Location: [place];  Service: Open Heart Surgery;  Laterality: N/A;  . Tee without cardioversion N/A 07/10/2014    Procedure: TRANSESOPHAGEAL ECHOCARDIOGRAM (TEE);  Surgeon: Rexene Alberts, MD;  Location: [place];  Service: Open Heart Surgery;  Laterality: N/A;  . Repair of patent foramen ovale N/A 07/10/2014    Procedure: REPAIR OF PATENT FORAMEN OVALE;  Surgeon: Rexene Alberts, MD;  Location: [place];  Service: Open Heart Surgery;  Laterality: N/A;    Her Family History Is Significant For: Family History  Problem Relation Age of Onset  . Hypertension Mother   . Heart failure Mother     Deceased  . Heart attack Mother   . Hypertension Sister   . Hypertension Brother   . Cerebral aneurysm Father 71    Deceased  . Stroke Neg Hx   . Heart attack Daughter   . Breast cancer Maternal Grandmother   . Heart attack Maternal Grandfather     Her Social History Is Significant For: Social History   Social History  . Marital Status: Divorced    Spouse Name: N/A  . Number of Children: 3  . Years of Education: college   Occupational History  .      retired Pharmacist, hospital   Social History Main Topics  . Smoking status: Former Smoker    Quit date: 07/01/2014  . Smokeless tobacco: Never Used     Comment: Uses nicotiene losenges  . Alcohol Use: 0.0 oz/week    0 Glasses of wine per week     Comment: rare  . Drug Use: No  . Sexual Activity: Not Asked   Other Topics Concern  . None   Social History Narrative   Patient lives at home alone and she is divorced.   Retired Pharmacist, hospital.   Education college.   Right handed.   Caffeine coffee two cups.    Her Allergies Are:  Allergies  Allergen Reactions  . Sulfa Antibiotics     Childhood allergy  :   Her Current Medications Are:  Outpatient Encounter Prescriptions as of 12/09/2014   Medication Sig  . amLODipine (NORVASC) 2.5 MG tablet Take 2.5 mg by mouth at bedtime.   Marland Kitchen aspirin EC 325 MG EC tablet Take 1 tablet (325 mg total) by mouth daily.  Marland Kitchen atorvastatin (LIPITOR) 80 MG tablet Take 1 tablet (80 mg total) by mouth daily at 6 PM.  . CALCIUM PO Take 1 tablet by mouth daily.  . Cholecalciferol (VITAMIN D-3) 1000 UNITS CAPS Take 1,000 Units by mouth daily.  . clopidogrel (PLAVIX) 75 MG tablet TK 1 T PO QD STARTING 10 DAYS PRIOR TO PROCEDURE  . hydrochlorothiazide (MICROZIDE) 12.5 MG capsule Take 1 capsule (12.5 mg total) by mouth daily.  Marland Kitchen KRILL OIL PO Take 1 tablet by mouth daily.  . LUTEIN PO Take 1 tablet by mouth daily.  Marland Kitchen  Multiple Vitamins-Minerals (EYE VITAMINS PO) Take 1 tablet by mouth daily.  . Multiple Vitamins-Minerals (MULTIVITAL) tablet Take 1 tablet by mouth daily.  . nicotine polacrilex (NICOTINE MINI) 2 MG lozenge Take 2 mg by mouth as needed for smoking cessation.  Marland Kitchen oxybutynin (DITROPAN-XL) 10 MG 24 hr tablet Take 10 mg by mouth at bedtime.  . [DISCONTINUED] docusate sodium (COLACE) 100 MG capsule Take 100 mg by mouth daily.  . [DISCONTINUED] ibuprofen (ADVIL,MOTRIN) 200 MG tablet Take 200 mg by mouth every 6 (six) hours as needed for headache, mild pain or moderate pain.  . [DISCONTINUED] Probiotic Product (PROBIOTIC DAILY PO) Take 1 capsule by mouth daily.   No facility-administered encounter medications on file as of 12/09/2014.  :  Review of Systems:  Out of a complete 14 point review of systems, all are reviewed and negative with the exception of these symptoms as listed below:   Review of Systems  Constitutional: Positive for fatigue.       Weight gain   Respiratory:       Snoring   Gastrointestinal: Positive for constipation.  Neurological: Positive for numbness.       No trouble falling asleep, trouble staying asleep, snoring, witnessed apnea, wakes up feeling tired, takes naps during the day.   Psychiatric/Behavioral:       Not enough  sleep     Objective:  Neurologic Exam  Physical Exam Physical Examination:   Filed Vitals:   12/09/14 1010  BP: 144/88  Pulse: 78  Resp: 16    General Examination: The patient is a very pleasant 64 y.o. female in no acute distress. She appears well-developed and well-nourished and well groomed. She is obese.   HEENT: Normocephalic, atraumatic, pupils are equal, round and reactive to light and accommodation. Funduscopic exam is normal with sharp disc margins noted. Extraocular tracking is good without limitation to gaze excursion or nystagmus noted. Normal smooth pursuit is noted. Hearing is grossly intact. She has mild bilateral cataracts. Face is symmetric with normal facial animation and normal facial sensation. Speech is clear with no dysarthria noted. There is no hypophonia. There is no lip, neck/head, jaw or voice tremor. Neck is supple with full range of passive and active motion. There are no carotid bruits on auscultation. Oropharynx exam reveals: mild mouth dryness, adequate dental hygiene and moderate airway crowding, due to thicker tongue, redundant soft palate, large uvula, and redundant. Tonsils appear to be in place but are small, she reports never having had a tonsillectomy. Mallampati is class III. Neck circumference is 15 inches. Tongue protrudes centrally and palate elevates symmetrically. Nasal inspection reveals no significant nasal mucosal bogginess or redness and no septal deviation.   Chest: Clear to auscultation without wheezing, rhonchi or crackles noted.  Heart: S1+S2+0, regular and normal without murmurs, rubs or gallops noted.   Abdomen: Soft, non-tender and non-distended with normal bowel sounds appreciated on auscultation.  Extremities: There is no pitting edema in the distal lower extremities bilaterally. Pedal pulses are intact.  Skin: Warm and dry without trophic changes noted. There are no varicose veins.  Musculoskeletal: exam reveals no obvious joint  deformities, tenderness or joint swelling or erythema.   Neurologically:  Mental status: The patient is awake, alert and oriented in all 4 spheres. Her immediate and remote memory, attention, language skills and fund of knowledge are appropriate. There is no evidence of aphasia, agnosia, apraxia or anomia. Speech is clear with normal prosody and enunciation. Thought process is linear. Mood is normal and  affect is normal.  Cranial nerves II - XII are as described above under HEENT exam. In addition: shoulder shrug is normal with equal shoulder height noted. Motor exam: Normal bulk, strength and tone is noted. There is no drift, tremor or rebound. Romberg is negative. Reflexes are 2+ throughout. Babinski: Toes are flexor bilaterally. Fine motor skills and coordination: intact with normal finger taps, normal hand movements, normal rapid alternating patting, normal foot taps and normal foot agility.  Cerebellar testing: No dysmetria or intention tremor on finger to nose testing. Heel to shin is unremarkable bilaterally. There is no truncal or gait ataxia.  Sensory exam: intact to light touch, pinprick, vibration, temperature sense in the upper and lower extremities, with the exception of mild decrease in pinprick sensation in the left hand.  Gait, station and balance: She stands easily. No veering to one side is noted. No leaning to one side is noted. Posture is age-appropriate and stance is narrow based. Gait shows normal stride length and normal pace. No problems turning are noted. She turns en bloc. Tandem walk is  slightly difficult for her in the beginning.   Assessment and plan:   In summary, Jonne Rote is a very pleasant 64 y.o.-year old female with an underlying medical history of hypertension, ACA aneurysm (scheduled for elective coiling under Dr. [name] on 12/23/14), coronary artery disease, status post 2 vessel CABG on 07/10/2014, papillary fibroblastoma of the heart, stroke in March 2016,  hyperlipidemia, recent smoking cessation, and obesity, who reports snoring, witnessed apneas per daughter (recently shared a hotel room), nocturia and excessive daytime somnolence. Her history and physical exam are in keeping with obstructive sleep apnea (OSA). I had a long chat with the patient about my findings and the diagnosis of OSA, its prognosis and treatment options. We talked about medical treatments, surgical interventions and non-pharmacological approaches. I explained in particular the risks and ramifications of untreated moderate to severe OSA, especially with respect to developing cardiovascular disease down the Road, including congestive heart failure, difficult to treat hypertension, cardiac arrhythmias, or stroke. Even type 2 diabetes has, in part, been linked to untreated OSA. Symptoms of untreated OSA include daytime sleepiness, memory problems, mood irritability and mood disorder such as depression and anxiety, lack of energy, as well as recurrent headaches, especially morning headaches. We talked about smoking and nicotine cessation and trying to maintain a healthy lifestyle in general, as well as the importance of weight control. I encouraged the patient to eat healthy, exercise daily and keep well hydrated, to keep a scheduled bedtime and wake time routine, to not skip any meals and eat healthy snacks in between meals. I advised the patient not to drive when feeling sleepy. I recommended the following at this time: sleep study with potential positive airway pressure titration. (We will score hypopneas at 4% and split the sleep study into diagnostic and treatment portion, if the estimated. 2 hour AHI is >15/h).   I explained the sleep test procedure to the patient and also outlined possible surgical and non-surgical treatment options of OSA, including the use of a custom-made dental device (which would require a referral to a specialist dentist or oral surgeon), upper airway surgical  options, such as pillar implants, radiofrequency surgery, tongue base surgery, and UPPP (which would involve a referral to an ENT surgeon). Rarely, jaw surgery such as mandibular advancement may be considered.  I also explained the CPAP treatment option to the patient, who indicated that she would be willing to try CPAP  if the need arises. I explained the importance of being compliant with PAP treatment, not only for insurance purposes but primarily to improve Her symptoms, and for the patient's long term health benefit, including to reduce Her cardiovascular risks. I answered all her questions today and the patient was in agreement. I would like to see her back after the sleep study is completed and encouraged her to call with any interim questions, concerns, problems or updates.   Thank you very much for allowing me to participate in the care of this nice patient. If I can be of any further assistance to you please do not hesitate to talk to me.   Sincerely,   Star Age, MD, PhD

## 2014-12-10 ENCOUNTER — Encounter (HOSPITAL_COMMUNITY): Payer: Self-pay

## 2014-12-11 ENCOUNTER — Encounter (HOSPITAL_COMMUNITY): Payer: Self-pay

## 2014-12-12 NOTE — Pre-Procedure Instructions (Addendum)
Hannah Nielsen  12/12/2014      [id number] - [place], [place] - [address] AT [address] [place] [address] Phone: [phone] Fax: [phone]    Your procedure is scheduled on Tues, Sept 13 @ 8:30 AM  Report to [facility] Admitting at 5:30 AM  Call this number if you have problems the morning of surgery:  [phone]   Remember:  Do not eat food or drink liquids after midnight Monday.  Take these medicines the morning of surgery with A SIP OF WATER : Amlodipine(Norvasc)              Stop taking your Aspirin and Plavix as you have been instructed to do along with any Vitamins or Herbal Medications. No Goody's,BC's,Aleve,Ibuprofen,or Fish Oil.   Do not wear jewelry, make-up or nail polish.  Do not wear lotions, powders, or perfumes.    Do not shave underarms & legs 48 hours prior to surgery.     Do not bring valuables to the hospital.  [facility] is not responsible for any belongings or valuables.  Contacts, dentures or bridgework may not be worn into surgery.  Leave your suitcase in the car.  After surgery it may be brought to your room. For patients admitted to the hospital, discharge time will be determined by your treatment team.  Name and phone number of your driver:     Special instructions: .[facility] - Preparing for Surgery  Before surgery, you can play an important role.  Because skin is not sterile, your skin needs to be as free of germs as possible.  You can reduce the number of germs on you skin by washing with CHG (chlorahexidine gluconate) soap before surgery.  CHG is an antiseptic cleaner which kills germs and bonds with the skin to continue killing germs even after washing.  Please DO NOT use if you have an allergy to CHG or antibacterial soaps.  If your skin becomes reddened/irritated stop using the CHG and inform your nurse when you arrive at Short Stay.  Do not shave  (including legs and underarms) for at least 48 hours prior to the first CHG shower.  You may shave your face.  Please follow these instructions carefully:   1.  Shower with CHG Soap the night before surgery and the                                morning of Surgery.  2.  If you choose to wash your hair, wash your hair first as usual with your       normal shampoo.  3.  After you shampoo, rinse your hair and body thoroughly to remove the                      Shampoo.  4.  Use CHG as you would any other liquid soap.  You can apply chg directly       to the skin and wash gently with scrungie or a clean washcloth.  5.  Apply the CHG Soap to your body ONLY FROM THE NECK DOWN.        Do not use on open wounds or open sores.  Avoid contact with your eyes,       ears, mouth and genitals (private parts).  Wash genitals (private parts)  with your normal soap.  6.  Wash thoroughly, paying special attention to the area where your surgery        will be performed.  7.  Thoroughly rinse your body with warm water from the neck down.  8.  DO NOT shower/wash with your normal soap after using and rinsing off       the CHG Soap.  9.  Pat yourself dry with a clean towel.            10.  Wear clean pajamas.            11.  Place clean sheets on your bed the night of your first shower and do not        sleep with pets.  Day of Surgery  Do not apply any lotions/deoderants the morning of surgery.  Please wear clean clothes to the hospital/surgery center.    Please read over the following fact sheets that you were given. Pain Booklet and Surgical Site Infection Prevention

## 2014-12-16 ENCOUNTER — Encounter (HOSPITAL_COMMUNITY)
Admission: RE | Admit: 2014-12-16 | Discharge: 2014-12-16 | Disposition: A | Discharge status: Home / Self Care | Payer: Medicare PPO | Source: Ambulatory Visit | Attending: Neurosurgery | Admitting: Neurosurgery

## 2014-12-16 ENCOUNTER — Encounter (HOSPITAL_COMMUNITY): Payer: Self-pay

## 2014-12-16 DIAGNOSIS — E785 Hyperlipidemia, unspecified: Secondary | ICD-10-CM | POA: Diagnosis not present

## 2014-12-16 DIAGNOSIS — Z87891 Personal history of nicotine dependence: Secondary | ICD-10-CM | POA: Insufficient documentation

## 2014-12-16 DIAGNOSIS — Z79899 Other long term (current) drug therapy: Secondary | ICD-10-CM | POA: Insufficient documentation

## 2014-12-16 DIAGNOSIS — Z8673 Personal history of transient ischemic attack (TIA), and cerebral infarction without residual deficits: Secondary | ICD-10-CM | POA: Diagnosis not present

## 2014-12-16 DIAGNOSIS — I671 Cerebral aneurysm, nonruptured: Secondary | ICD-10-CM | POA: Insufficient documentation

## 2014-12-16 DIAGNOSIS — K219 Gastro-esophageal reflux disease without esophagitis: Secondary | ICD-10-CM | POA: Insufficient documentation

## 2014-12-16 DIAGNOSIS — Z01812 Encounter for preprocedural laboratory examination: Secondary | ICD-10-CM | POA: Diagnosis not present

## 2014-12-16 DIAGNOSIS — Z7902 Long term (current) use of antithrombotics/antiplatelets: Secondary | ICD-10-CM | POA: Insufficient documentation

## 2014-12-16 DIAGNOSIS — I10 Essential (primary) hypertension: Secondary | ICD-10-CM | POA: Insufficient documentation

## 2014-12-16 DIAGNOSIS — I2510 Atherosclerotic heart disease of native coronary artery without angina pectoris: Secondary | ICD-10-CM | POA: Insufficient documentation

## 2014-12-16 DIAGNOSIS — Z7982 Long term (current) use of aspirin: Secondary | ICD-10-CM | POA: Insufficient documentation

## 2014-12-16 DIAGNOSIS — Z01818 Encounter for other preprocedural examination: Secondary | ICD-10-CM | POA: Insufficient documentation

## 2014-12-16 DIAGNOSIS — Z951 Presence of aortocoronary bypass graft: Secondary | ICD-10-CM | POA: Diagnosis not present

## 2014-12-16 HISTORY — DX: Frequency of micturition: R35.0

## 2014-12-16 HISTORY — DX: Nocturia: R35.1

## 2014-12-16 HISTORY — DX: Reserved for inherently not codable concepts without codable children: IMO0001

## 2014-12-16 HISTORY — DX: Gastro-esophageal reflux disease without esophagitis: K21.9

## 2014-12-16 HISTORY — DX: Constipation, unspecified: K59.00

## 2014-12-16 HISTORY — DX: Personal history of colon polyps, unspecified: Z86.0100

## 2014-12-16 HISTORY — DX: Urgency of urination: R39.15

## 2014-12-16 HISTORY — DX: Unspecified cataract: H26.9

## 2014-12-16 HISTORY — DX: Insomnia, unspecified: G47.00

## 2014-12-16 HISTORY — DX: Personal history of colonic polyps: Z86.010

## 2014-12-16 HISTORY — DX: Paresthesia of skin: R20.2

## 2014-12-16 LAB — CBC
HEMATOCRIT: 42.4 % (ref 36.0–46.0)
HEMOGLOBIN: 14.6 g/dL (ref 12.0–15.0)
MCH: 30.3 pg (ref 26.0–34.0)
MCHC: 34.4 g/dL (ref 30.0–36.0)
MCV: 88 fL (ref 78.0–100.0)
Platelets: 289 10*3/uL (ref 150–400)
RBC: 4.82 MIL/uL (ref 3.87–5.11)
RDW: 13.8 % (ref 11.5–15.5)
WBC: 10.4 10*3/uL (ref 4.0–10.5)

## 2014-12-16 LAB — BASIC METABOLIC PANEL
ANION GAP: 9 (ref 5–15)
BUN: 18 mg/dL (ref 6–20)
CALCIUM: 9.6 mg/dL (ref 8.9–10.3)
CHLORIDE: 104 mmol/L (ref 101–111)
CO2: 23 mmol/L (ref 22–32)
Creatinine, Ser: 0.66 mg/dL (ref 0.44–1.00)
GFR calc non Af Amer: >60 mL/min (ref >60)
GLUCOSE: 100 mg/dL — AB (ref 65–99)
POTASSIUM: 4 mmol/L (ref 3.5–5.1)
Sodium: 136 mmol/L (ref 135–145)

## 2014-12-16 LAB — URINALYSIS, ROUTINE W REFLEX MICROSCOPIC
Bilirubin Urine: NEGATIVE
GLUCOSE, UA: NEGATIVE mg/dL
HGB URINE DIPSTICK: NEGATIVE
Ketones, ur: NEGATIVE mg/dL
Nitrite: NEGATIVE
Protein, ur: NEGATIVE mg/dL
SPECIFIC GRAVITY, URINE: 1.012 (ref 1.005–1.030)
UROBILINOGEN UA: 0.2 mg/dL (ref 0.0–1.0)
pH: 6 (ref 5.0–8.0)

## 2014-12-16 LAB — URINE MICROSCOPIC-ADD ON

## 2014-12-16 NOTE — Progress Notes (Signed)
   12/16/14 0824  OBSTRUCTIVE SLEEP APNEA  Do you snore loudly (loud enough to be heard through closed doors)?  1  Do you often feel tired, fatigued, or sleepy during the daytime? 0  Has anyone observed you stop breathing during your sleep? 1  Do you have, or are you being treated for high blood pressure? 1  BMI more than 35 kg/m2? 1  Age over 65 years old? 1  Neck circumference greater than 40 cm/16 inches? 0  Gender: 0

## 2014-12-16 NOTE — Progress Notes (Addendum)
Cardiologist is Dr.Paula Ross-last visit in epic from 10-06-14  Medical MD is Dr.Stoneking  Echo reports in epic from 2016  Stress test denies ever having one  Heart cath report in epic from 06/2014  EKG in epic from 08-04-14  CXR in epic from 10-20-14  To have a sleep study after surgery but has been consulted

## 2014-12-17 ENCOUNTER — Encounter (HOSPITAL_COMMUNITY): Payer: Self-pay

## 2014-12-17 NOTE — Progress Notes (Signed)
Anesthesia Chart Review:  Pt is 64 year old female scheduled for stent support coil embolization for R ACA aneurysm on 12/23/2014 with Dr. [name].   PCP is Dr. Lajean Manes. Cardiologist is Dr. Harrington Challenger.   PMH includes: CAD, CVA (embolic stroke to R MCA territory 07/01/14), HTN, R ACA aneurysm, hyperlipidemia, GERD. Former smoker (quit in March). BMI 36. S/p CABG (LIMA to LAD, SVG to RCA), resection of L atrial mass 07/10/14.   Medications include: amlodipine, ASA, lipitor, plavix, hctz. Pt to stay on plavix and ASA for procedure.   Preoperative labs reviewed.    Chest x-ray 10/20/2014 reviewed. Prior CABG. Heart size normal. No acute cardiopulmonary disease.   EKG 08/04/2014: NSR. Cannot rule out anterior infarct, age undetermined.   Echo 08/19/2014:  - Left ventricle: The cavity size was normal. Systolic function was normal. The estimated ejection fraction was in the range of 60% to 65%. Wall motion was normal; there were no regional wall motion abnormalities. Left ventricular diastolic function parameters were normal. - Atrial septum: No defect or patent foramen ovale was identified.  Cardiac cath 07/04/2014: 1. Mild disease in the distal left main coronary artery. 2. Severe disease in the ostial left anterior descending artery.. 3. Mild to moderate disease in the left circumflex artery and its branches. 4. Severe disease in the mid right coronary artery. 5. Left ventricular systolic function not assessed. LVEDP 2 mmHg.  6. Unable to access right radial artery even with ultrasound guidance due to vasospasm.  Carotid doppler US 07/02/2014:  - B ICAstenosis in the 1-39% range.  If no changes, I anticipate pt can proceed with surgery as scheduled.   Willeen Cass, FNP-BC [facility]iology Phone: [phone] 12/17/2014 4:36 PM

## 2014-12-18 ENCOUNTER — Encounter (HOSPITAL_COMMUNITY): Payer: Self-pay

## 2014-12-23 ENCOUNTER — Encounter (HOSPITAL_COMMUNITY): Payer: Self-pay | Admitting: *Deleted

## 2014-12-23 ENCOUNTER — Inpatient Hospital Stay (HOSPITAL_COMMUNITY): Payer: Medicare PPO | Admitting: Emergency Medicine

## 2014-12-23 ENCOUNTER — Ambulatory Visit (HOSPITAL_COMMUNITY)
Admission: RE | Admit: 2014-12-23 | Discharge: 2014-12-23 | Disposition: A | Discharge status: Home / Self Care | Payer: Medicare PPO | Source: Ambulatory Visit | Attending: Neurosurgery | Admitting: Neurosurgery

## 2014-12-23 ENCOUNTER — Inpatient Hospital Stay (HOSPITAL_COMMUNITY): Payer: Medicare PPO | Admitting: Anesthesiology

## 2014-12-23 ENCOUNTER — Inpatient Hospital Stay (HOSPITAL_COMMUNITY)
Admission: RE | Admit: 2014-12-23 | Discharge: 2014-12-24 | DRG: 027 | Disposition: A | Discharge status: Home / Self Care | Payer: Medicare PPO | Source: Ambulatory Visit | Attending: Neurosurgery | Admitting: Neurosurgery

## 2014-12-23 ENCOUNTER — Encounter (HOSPITAL_COMMUNITY): Payer: Self-pay

## 2014-12-23 ENCOUNTER — Encounter (HOSPITAL_COMMUNITY)
Admission: RE | Disposition: A | Discharge status: Home / Self Care | Payer: Self-pay | Source: Ambulatory Visit | Attending: Neurosurgery

## 2014-12-23 DIAGNOSIS — E785 Hyperlipidemia, unspecified: Secondary | ICD-10-CM | POA: Diagnosis present

## 2014-12-23 DIAGNOSIS — K219 Gastro-esophageal reflux disease without esophagitis: Secondary | ICD-10-CM | POA: Diagnosis not present

## 2014-12-23 DIAGNOSIS — Z7902 Long term (current) use of antithrombotics/antiplatelets: Secondary | ICD-10-CM

## 2014-12-23 DIAGNOSIS — R351 Nocturia: Secondary | ICD-10-CM | POA: Diagnosis present

## 2014-12-23 DIAGNOSIS — Z881 Allergy status to other antibiotic agents status: Secondary | ICD-10-CM

## 2014-12-23 DIAGNOSIS — I2510 Atherosclerotic heart disease of native coronary artery without angina pectoris: Secondary | ICD-10-CM | POA: Diagnosis not present

## 2014-12-23 DIAGNOSIS — I10 Essential (primary) hypertension: Secondary | ICD-10-CM | POA: Diagnosis present

## 2014-12-23 DIAGNOSIS — I671 Cerebral aneurysm, nonruptured: Secondary | ICD-10-CM

## 2014-12-23 DIAGNOSIS — Z7982 Long term (current) use of aspirin: Secondary | ICD-10-CM

## 2014-12-23 DIAGNOSIS — Z8673 Personal history of transient ischemic attack (TIA), and cerebral infarction without residual deficits: Secondary | ICD-10-CM | POA: Diagnosis not present

## 2014-12-23 DIAGNOSIS — Z79899 Other long term (current) drug therapy: Secondary | ICD-10-CM

## 2014-12-23 DIAGNOSIS — Z951 Presence of aortocoronary bypass graft: Secondary | ICD-10-CM

## 2014-12-23 DIAGNOSIS — K5900 Constipation, unspecified: Secondary | ICD-10-CM | POA: Diagnosis not present

## 2014-12-23 DIAGNOSIS — Z87891 Personal history of nicotine dependence: Secondary | ICD-10-CM | POA: Diagnosis not present

## 2014-12-23 HISTORY — PX: RADIOLOGY WITH ANESTHESIA: SHX6223

## 2014-12-23 LAB — CBC
HCT: 39 % (ref 36.0–46.0)
Hemoglobin: 12.8 g/dL (ref 12.0–15.0)
MCH: 29.4 pg (ref 26.0–34.0)
MCHC: 32.8 g/dL (ref 30.0–36.0)
MCV: 89.7 fL (ref 78.0–100.0)
Platelets: 285 10*3/uL (ref 150–400)
RBC: 4.35 MIL/uL (ref 3.87–5.11)
RDW: 13.9 % (ref 11.5–15.5)
WBC: 8.3 10*3/uL (ref 4.0–10.5)

## 2014-12-23 LAB — PROTIME-INR
INR: 0.96 (ref 0.00–1.49)
Prothrombin Time: 13 seconds (ref 11.6–15.2)

## 2014-12-23 LAB — CREATININE, SERUM
CREATININE: 0.7 mg/dL (ref 0.44–1.00)
GFR calc Af Amer: >60 mL/min (ref >60)

## 2014-12-23 LAB — GLUCOSE, CAPILLARY: Glucose-Capillary: 102 mg/dL — ABNORMAL HIGH (ref 65–99)

## 2014-12-23 LAB — MRSA PCR SCREENING: MRSA by PCR: NEGATIVE

## 2014-12-23 SURGERY — RADIOLOGY WITH ANESTHESIA
Anesthesia: General

## 2014-12-23 MED ORDER — LACTATED RINGERS IV SOLN
INTRAVENOUS | Status: DC
  Administered 2014-12-23 (×2): via INTRAVENOUS

## 2014-12-23 MED ORDER — AMLODIPINE BESYLATE 2.5 MG PO TABS
2.5000 mg | ORAL_TABLET | Freq: Every day | ORAL | Status: DC
  Filled 2014-12-23: qty 1

## 2014-12-23 MED ORDER — CEFAZOLIN SODIUM-DEXTROSE 2-3 GM-% IV SOLR
INTRAVENOUS | Status: AC
  Filled 2014-12-23: qty 50

## 2014-12-23 MED ORDER — NICOTINE POLACRILEX 2 MG MT LOZG: 1.0000 mg | LOZENGE | OROMUCOSAL | Status: DC | PRN

## 2014-12-23 MED ORDER — POLYETHYLENE GLYCOL 3350 17 G PO PACK: 17.0000 g | PACK | Freq: Every day | ORAL | Status: DC | PRN

## 2014-12-23 MED ORDER — SODIUM CHLORIDE 0.9 % IV SOLN: INTRAVENOUS | Status: DC

## 2014-12-23 MED ORDER — OXYBUTYNIN CHLORIDE ER 10 MG PO TB24
10.0000 mg | ORAL_TABLET | Freq: Every day | ORAL | Status: DC
  Administered 2014-12-23: 10 mg via ORAL
  Filled 2014-12-23 (×2): qty 1

## 2014-12-23 MED ORDER — ONDANSETRON HCL 4 MG/2ML IJ SOLN
INTRAMUSCULAR | Status: DC | PRN
  Administered 2014-12-23: 4 mg via INTRAVENOUS

## 2014-12-23 MED ORDER — HYDROCHLOROTHIAZIDE 12.5 MG PO CAPS
12.5000 mg | ORAL_CAPSULE | Freq: Every day | ORAL | Status: DC
  Administered 2014-12-23 – 2014-12-24 (×2): 12.5 mg via ORAL
  Filled 2014-12-23 (×2): qty 1

## 2014-12-23 MED ORDER — CLOPIDOGREL BISULFATE 75 MG PO TABS
75.0000 mg | ORAL_TABLET | Freq: Every day | ORAL | Status: DC
  Administered 2014-12-24: 75 mg via ORAL
  Filled 2014-12-23 (×2): qty 1

## 2014-12-23 MED ORDER — ONDANSETRON HCL 4 MG/2ML IJ SOLN: 4.0000 mg | INTRAMUSCULAR | Status: DC | PRN

## 2014-12-23 MED ORDER — SODIUM CHLORIDE 0.9 % IV SOLN
INTRAVENOUS | Status: DC
  Administered 2014-12-23: 17:00:00 via INTRAVENOUS

## 2014-12-23 MED ORDER — PANTOPRAZOLE SODIUM 40 MG IV SOLR
40.0000 mg | Freq: Every day | INTRAVENOUS | Status: DC
  Administered 2014-12-23: 40 mg via INTRAVENOUS
  Filled 2014-12-23 (×2): qty 40

## 2014-12-23 MED ORDER — VITAMIN D 1000 UNITS PO TABS
1000.0000 [IU] | ORAL_TABLET | Freq: Every day | ORAL | Status: DC
  Administered 2014-12-23 – 2014-12-24 (×2): 1000 [IU] via ORAL
  Filled 2014-12-23 (×2): qty 1

## 2014-12-23 MED ORDER — IOHEXOL 300 MG/ML  SOLN
150.0000 mL | Freq: Once | INTRAMUSCULAR | Status: DC | PRN
  Administered 2014-12-23: 60 mL via INTRAVENOUS
  Filled 2014-12-23: qty 150

## 2014-12-23 MED ORDER — NITROGLYCERIN 5 MG/ML IV SOLN: 1.5000 mg | INTRAVENOUS | Status: DC

## 2014-12-23 MED ORDER — ATORVASTATIN CALCIUM 80 MG PO TABS
80.0000 mg | ORAL_TABLET | Freq: Every day | ORAL | Status: DC
  Administered 2014-12-23: 80 mg via ORAL
  Filled 2014-12-23 (×2): qty 1

## 2014-12-23 MED ORDER — CALCIUM CARBONATE 600 MG PO TABS: 600.0000 mg | ORAL_TABLET | Freq: Every day | ORAL | Status: DC

## 2014-12-23 MED ORDER — SODIUM CHLORIDE 0.9 % IJ SOLN
1.5000 mg | INTRAVENOUS | Status: AC
  Administered 2014-12-23: 25 ug via INTRA_ARTERIAL

## 2014-12-23 MED ORDER — ONDANSETRON HCL 4 MG/2ML IJ SOLN: 4.0000 mg | Freq: Once | INTRAMUSCULAR | Status: DC | PRN

## 2014-12-23 MED ORDER — VERAPAMIL HCL 2.5 MG/ML IV SOLN
INTRAVENOUS | Status: AC
  Filled 2014-12-23: qty 4

## 2014-12-23 MED ORDER — DOCUSATE SODIUM 100 MG PO CAPS
100.0000 mg | ORAL_CAPSULE | Freq: Two times a day (BID) | ORAL | Status: DC
  Administered 2014-12-23 (×2): 100 mg via ORAL
  Filled 2014-12-23 (×4): qty 1

## 2014-12-23 MED ORDER — CALCIUM CARBONATE 1250 (500 CA) MG PO TABS
1.0000 | ORAL_TABLET | Freq: Every day | ORAL | Status: DC
  Administered 2014-12-24: 500 mg via ORAL
  Filled 2014-12-23 (×3): qty 1

## 2014-12-23 MED ORDER — MORPHINE SULFATE (PF) 2 MG/ML IV SOLN: 1.0000 mg | INTRAVENOUS | Status: DC | PRN

## 2014-12-23 MED ORDER — SENNA 8.6 MG PO TABS
1.0000 | ORAL_TABLET | Freq: Two times a day (BID) | ORAL | Status: DC
  Administered 2014-12-23 (×2): 8.6 mg via ORAL
  Filled 2014-12-23 (×4): qty 1

## 2014-12-23 MED ORDER — ROCURONIUM BROMIDE 100 MG/10ML IV SOLN
INTRAVENOUS | Status: DC | PRN
  Administered 2014-12-23: 20 mg via INTRAVENOUS
  Administered 2014-12-23: 10 mg via INTRAVENOUS
  Administered 2014-12-23: 40 mg via INTRAVENOUS
  Administered 2014-12-23 (×2): 10 mg via INTRAVENOUS

## 2014-12-23 MED ORDER — CEFAZOLIN SODIUM-DEXTROSE 2-3 GM-% IV SOLR
2.0000 g | INTRAVENOUS | Status: AC
  Administered 2014-12-23: 2 g via INTRAVENOUS

## 2014-12-23 MED ORDER — FENTANYL CITRATE (PF) 100 MCG/2ML IJ SOLN: 25.0000 ug | INTRAMUSCULAR | Status: DC | PRN

## 2014-12-23 MED ORDER — LIDOCAINE HCL (CARDIAC) 20 MG/ML IV SOLN
INTRAVENOUS | Status: DC | PRN
  Administered 2014-12-23: 80 mg via INTRAVENOUS

## 2014-12-23 MED ORDER — GLYCOPYRROLATE 0.2 MG/ML IJ SOLN
INTRAMUSCULAR | Status: DC | PRN
  Administered 2014-12-23: .8 mg via INTRAVENOUS

## 2014-12-23 MED ORDER — ONDANSETRON HCL 4 MG PO TABS: 4.0000 mg | ORAL_TABLET | ORAL | Status: DC | PRN

## 2014-12-23 MED ORDER — ASPIRIN EC 325 MG PO TBEC
325.0000 mg | DELAYED_RELEASE_TABLET | Freq: Every day | ORAL | Status: DC
  Administered 2014-12-24: 325 mg via ORAL
  Filled 2014-12-23 (×2): qty 1

## 2014-12-23 MED ORDER — BISACODYL 10 MG RE SUPP: 10.0000 mg | Freq: Every day | RECTAL | Status: DC | PRN

## 2014-12-23 MED ORDER — HYDROCODONE-ACETAMINOPHEN 5-325 MG PO TABS: 1.0000 | ORAL_TABLET | ORAL | Status: DC | PRN

## 2014-12-23 MED ORDER — ADULT MULTIVITAMIN W/MINERALS CH
1.0000 | ORAL_TABLET | Freq: Every day | ORAL | Status: DC
  Administered 2014-12-23: 1 via ORAL
  Filled 2014-12-23 (×2): qty 1

## 2014-12-23 MED ORDER — PHENYLEPHRINE HCL 10 MG/ML IJ SOLN
10.0000 mg | INTRAVENOUS | Status: DC | PRN
  Administered 2014-12-23: 5 ug/min via INTRAVENOUS

## 2014-12-23 MED ORDER — HEPARIN SODIUM (PORCINE) 1000 UNIT/ML IJ SOLN
INTRAMUSCULAR | Status: DC | PRN
  Administered 2014-12-23: 5000 [IU] via INTRAVENOUS
  Administered 2014-12-23: 1000 [IU] via INTRAVENOUS

## 2014-12-23 MED ORDER — HEPARIN SODIUM (PORCINE) 5000 UNIT/ML IJ SOLN
5000.0000 [IU] | Freq: Three times a day (TID) | INTRAMUSCULAR | Status: DC
  Administered 2014-12-24: 5000 [IU] via SUBCUTANEOUS
  Filled 2014-12-23 (×4): qty 1

## 2014-12-23 MED ORDER — FENTANYL CITRATE (PF) 100 MCG/2ML IJ SOLN
INTRAMUSCULAR | Status: DC | PRN
  Administered 2014-12-23: 25 ug via INTRAVENOUS
  Administered 2014-12-23: 150 ug via INTRAVENOUS

## 2014-12-23 MED ORDER — LABETALOL HCL 5 MG/ML IV SOLN
10.0000 mg | INTRAVENOUS | Status: DC | PRN
  Administered 2014-12-23: 10 mg via INTRAVENOUS
  Filled 2014-12-23: qty 4
  Filled 2014-12-23: qty 8

## 2014-12-23 MED ORDER — NICOTINE POLACRILEX 2 MG MT LOZG
2.0000 mg | LOZENGE | OROMUCOSAL | Status: DC | PRN
  Filled 2014-12-23: qty 1

## 2014-12-23 MED ORDER — PROPOFOL 10 MG/ML IV BOLUS
INTRAVENOUS | Status: DC | PRN
  Administered 2014-12-23: 130 mg via INTRAVENOUS

## 2014-12-23 MED ORDER — NITROGLYCERIN 1 MG/10 ML FOR IR/CATH LAB
INTRA_ARTERIAL | Status: AC
  Filled 2014-12-23: qty 10

## 2014-12-23 MED ORDER — NEOSTIGMINE METHYLSULFATE 10 MG/10ML IV SOLN
INTRAVENOUS | Status: DC | PRN
  Administered 2014-12-23: 5 mg via INTRAVENOUS

## 2014-12-23 NOTE — Progress Notes (Signed)
   12/23/14 1223  Vitals  Temp 97.5 F (36.4 C)  BP (!) 142/76 mmHg  Pulse Rate 83  ECG Heart Rate 84  Resp 14  Oxygen Therapy  SpO2 97 %  Art Line (2)  Arterial Line BP 2 167/82 mmHg  Arterial Line MAP (mmHg) 112 mmHg  Pain Assessment  Pain Assessment No/denies pain  Glasgow Coma Scale  Eye Opening 4  Best Verbal Response (NON-intubated) 5  Best Motor Response 6  Glasgow Coma Scale Score 15  Height and Weight  Height 5\' 2"  (1.575 m)  Weight 87.2 kg (192 lb 3.9 oz)  BSA (Calculated - sq m) 1.95 sq meters  BMI (Calculated) 35.2  Weight in (lb) to have BMI = 25 136.4  Pt arrived to 3M09 at 1223.  Pt A&O x 4, no c/o pain, R femoral site covered with CDI pressure dressing, no drains, level zero, no signs of hematoma/swelling.  Fluids running at 75 cc/hr.  Foley intact, unclamped. Pt without distress.  Family updated. Diet ordered, will monitor.

## 2014-12-23 NOTE — Op Note (Signed)
COIL EMBOLIZATION OF ANTERIOR COMMUNICATING ARTERY ANEURYSM   OPERATOR:  Dr. Consuella Lose, MD  HISTORY:  The patient is a 64 y.o. female with an incidentally discovered anterior communicating artery aneurysm. She presents today for possible stent supported coil embolization. She has been treated preoperatively with aspirin and Plavix.  APPROACH:  The technical aspects of the procedure as well as its potential risks and benefits were reviewed with the patient. These risks included but were not limited bleeding, infection, allergic reaction, damage to organs/vital structures, stroke, non-diagnostic procedure, and the catastrophic outcomes of heart attack, coma, and death. With an understanding of these risks, informed consent was obtained and witnessed.   The patient was placed in the supine position on the angiography table and the skin of right groin prepped in the usual sterile fashion. The procedure was performed under general anesthesia.  A 8- French sheath was introduced in the right common femoral artery using Seldinger technique. A fluorophase sequence was used to document the sheath position.   HEPARIN: 6000 Units total.  CONTRAST AGENT: 60cc, Omnipaque 300  FLUOROSCOPY TIME: 44.8 combined AP and lateral minutes   CATHETER(S) AND WIRE(S):  0.035" glidewire  80 cm 6 [place] neuron max guide sheath 120 cm 6 [place] Berenstein select guide catheter 115 cm 072 Navien guide catheter 115 cm 058 catalyst 5 guide catheter 150 cm 027 xt-27 microcatheter XT-17 microcatheter Synchro 2 standard microwire  VESSELS CATHETERIZED:  Right internal carotid Right middle cerebral artery Right anterior cervical artery Right common femoral  VESSELS STUDIED:  Right internal carotid pre-embolization Right internal carotid during embolization Right internal carotid immediate post embolization Right internal carotid final control  COILS DEPLOYED (MRI  COMPATIBLE): Stryker 3 mm x 6 cm Target 3-D  STENT DEPLOYED (MRI COMPATIBLE): 2.80mm x 77mm Neuroform EZ 3  PROCEDURAL NARRATIVE:  The guide sheath was then introduced over the select guide catheter and 035 wire. The right common carotid artery was then selected, and the guide sheath was advanced into the distal right common carotid artery Under roadmap guidance, the right internal carotid artery proximal right internal carotid was selected, and the guide sheath was advanced into the right internal carotid artery. The select catheter was then removed without incident. The 66F Navien guide catheter was then introduced over the XT-27 microcatheter and Synchro standard microwire. The microcatheter was used to select the right middle cerebral artery, and attempt was made to advance the guide catheter passed the petrous segment of the internal carotid artery unsuccessfully. The 6 [place] guide catheter was therefore removed, and the 5 [place] catalyst guide catheter was introduced over the XT 27 microcatheter and microwire. The right middle cerebral artery was again catheterized with microcatheter, and the catalyst guide catheter was then advanced into the distal cavernous internal carotid artery. The XT-27 microcatheter was then removed, and the XT 17 microcatheter was introduced. The aneurysm was then catheterized. The above coil was then deployed, with angiograms taken to confirm good position. Immediate postembolization angiogram was then taken. The tail of the coil appeared to be in the parent vessel, and the decision was made to proceed with placement of a Neuroform stent. The microcatheter was removed, and the 027 microcatheter introduced over the microwire, and advanced into the distal anterior cerebral artery. The Neuroform stent was then deployed without incident across the aneurysm neck, extending from the A1 into the A2 segment. The microcatheter was then removed without incident. The guide catheter and  sheath were then withdrawn into the distal cervical internal  carotid artery, and final control angiogram was taken. The entire catheter construct was then removed synchronously without incident.  INTERPRETATION:  Right femoral:  Normal vessel. No significant atherosclerotic disease. Arterial sheath in adequate position.   Right internal carotid pre-embolization: Note is made of mild catheter induced vasospasm involving the mid to distal cervical internal carotid artery. There is good flow throughout the internal carotid artery, with normal bifurcation at the anterior cerebral and middle cerebral arteries. Again demonstrated is the approximate 3 mm inferiorly projecting right A1-A2 junction aneurysm. Capillary phase is unremarkable, and venous phase is normal.  Right internal carotid during embolization: Angiogram taken during embolization demonstrate coil mass within the aneurysm. There is a loop of coil which appears to be extending into the parent anterior cerebral artery, however this does not appear to be mobile with the pulse. There is no filling defects within the parent artery. There is good flow throughout the anterior cerebral artery territory.  Right internal carotid immediate postembolization: Injection demonstrates good flow through the internal carotid artery, middle cerebral artery, and anterior cerebral arteries. Coil mass within the anterior indicating artery aneurysm is stable. Neuroform stent is stable, extending from the A1 segment, across the aneurysm, into the A2 segment. There is no filling defects to suggest thrombus.  Right internal carotid final control: Injection demonstrates normal flow through the internal carotid artery, with normal bifurcation. There is good flow throughout the anterior cerebral and middle cerebral arteries and their distal territories. Coil mass within the aneurysm is stable. Stent position is stable. Capillary phase does not demonstrate any  perfusion deficits. Venous phases unremarkable.  DISPOSITION:  Upon completion of the study, the femoral sheath was removed and hemostasis obtained using a 7-Fr ExoSeal closure device. Good proximal and distal lower extremity pulses were documented upon achievement of hemostasis.   The procedure was well tolerated and no early complications were observed.    The patient was extubated and taken to the postanesthesia care unit in stable hemodynamic condition for further care.  IMPRESSION:  1. Successful stent supported coil embolization of a 3 mm anterior communicating artery aneurysm, as described above.  The preliminary results of this procedure were shared with the patient and the patient's family.

## 2014-12-23 NOTE — Anesthesia Procedure Notes (Signed)
Procedure Name: Intubation Date/Time: 12/23/2014 8:23 AM Performed by: Sampson Si E Pre-anesthesia Checklist: Patient identified, Emergency Drugs available, Suction available, Patient being monitored and Timeout performed Patient Re-evaluated:Patient Re-evaluated prior to inductionOxygen Delivery Method: Circle system utilized Preoxygenation: Pre-oxygenation with 100% oxygen Intubation Type: IV induction Ventilation: Mask ventilation without difficulty Laryngoscope Size: Mac and 3 Grade View: Grade III Tube type: Oral Tube size: 7.0 mm Number of attempts: 1 Airway Equipment and Method: Stylet Placement Confirmation: ETT inserted through vocal cords under direct vision,  positive ETCO2 and breath sounds checked- equal and bilateral Secured at: 21 cm Tube secured with: Tape Dental Injury: Teeth and Oropharynx as per pre-operative assessment

## 2014-12-23 NOTE — Transfer of Care (Signed)
Immediate Anesthesia Transfer of Care Note  Patient: Hannah Nielsen  Procedure(s) Performed: Procedure(s): Stent supported coil embolization OR right ACA aneurysm (N/A)  Patient Location: PACU  Anesthesia Type:General  Level of Consciousness: awake, alert  and oriented  Airway & Oxygen Therapy: Patient Spontanous Breathing and Patient connected to nasal cannula oxygen  Post-op Assessment: Report given to RN and Patient moving all extremities X 4  Post vital signs: Reviewed and stable  Last Vitals:  Filed Vitals:   12/23/14 0638  BP: 135/54  Pulse: 77  Temp: 36.6 C  Resp: 18    Complications: No apparent anesthesia complications

## 2014-12-23 NOTE — Anesthesia Preprocedure Evaluation (Addendum)
Anesthesia Evaluation  Patient identified by MRN, date of birth, ID band Patient awake  General Assessment Comment:CAD, CVA (embolic stroke to R MCA territory 07/01/14), HTN, R ACA aneurysm, hyperlipidemia, GERD. Former smoker (quit in March). BMI 36. S/p CABG (LIMA to LAD, SVG to RCA), resection of L atrial mass 07/10/14.   Reviewed: Allergy & Precautions, NPO status , Patient's Chart, lab work & pertinent test results  History of Anesthesia Complications Negative for: history of anesthetic complications  Airway Mallampati: II  TM Distance: >3 FB Neck ROM: Full    Dental no notable dental hx. (+) Teeth Intact, Dental Advisory Given   Pulmonary former smoker,    Pulmonary exam normal breath sounds clear to auscultation       Cardiovascular hypertension, Pt. on medications + CAD, + CABG and + Peripheral Vascular Disease  Normal cardiovascular exam Rhythm:Regular Rate:Normal     Neuro/Psych  Headaches, R ACA aneurysm CVA (L sided weakness, tingling in hands), Residual Symptoms negative psych ROS   GI/Hepatic Neg liver ROS, GERD  Medicated and Controlled,  Endo/Other  negative endocrine ROS  Renal/GU negative Renal ROS  negative genitourinary   Musculoskeletal negative musculoskeletal ROS (+)   Abdominal   Peds negative pediatric ROS (+)  Hematology negative hematology ROS (+)   Anesthesia Other Findings   Reproductive/Obstetrics negative OB ROS                          Anesthesia Physical Anesthesia Plan  ASA: III  Anesthesia Plan: General   Post-op Pain Management:    Induction: Intravenous  Airway Management Planned: Oral ETT  Additional Equipment: Arterial line  Intra-op Plan:   Post-operative Plan: Extubation in OR and Possible Post-op intubation/ventilation  Informed Consent: I have reviewed the patients History and Physical, chart, labs and discussed the procedure including  the risks, benefits and alternatives for the proposed anesthesia with the patient or authorized representative who has indicated his/her understanding and acceptance.   Dental advisory given  Plan Discussed with:   Anesthesia Plan Comments:        Anesthesia Quick Evaluation

## 2014-12-23 NOTE — H&P (Signed)
CC:    HPI: Hannah Nielsen is a 64 y.o. female presenting today for elective aneurysm treatment. She previously was found to have an incidental aneurysm after she suffered an embolic stroke due to a cardiac tumor with concomitant CAD. She has recently undergone diagnostic angiogram and now presents for possible stent-supported coil embolization. She has no new neurologic symptoms. She has been on ASA and Plavix for 10 days without excessive or spontaneous bleeding, including a dose this am.  PMH: Past Medical History  Diagnosis Date  . 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 CAD (coronary artery disease), native coronary artery: Severe 2 vessel dz per cardiac cath 07/04/14 07/04/2014  . Left atrial mass 07/03/2014    benign papillary fibroelastoma  . Stroke 2/68/3419    acute embolic stroke to right MCA territory  . Hyperlipidemia 07/02/2014    takes Lipitor daily  . S/P resection of left atrial benign papillary fibroelastoma and CABG x 2 07/10/2014    LIMA to LAD, SVG to RCA, EVH via right thigh  . Papillary fibroelastoma of heart   . Constipation     takes Miralax daily as needed  . Nocturia     takes Ditropan nightly  . Hypertension     takes Amlodipine and HCTZ daily  . Shortness of breath dyspnea     with exertion   . Headache     rarely   . Tingling     left hand,left breast,and left hip  . GERD (gastroesophageal reflux disease)     rarely takes Tums  . History of colon polyps     benign  . Urinary frequency   . Urinary urgency   . Cataract     left immature  . Insomnia     PSH: Past Surgical History  Procedure Laterality Date  . Abdominal hysterectomy    . Left heart catheterization with coronary angiogram N/A 07/04/2014    Procedure: LEFT HEART CATHETERIZATION WITH CORONARY ANGIOGRAM;  Surgeon: Jettie Booze, MD;  Location: [facility] CATH LAB;  Service: Cardiovascular;  Laterality: N/A;  . Tee without cardioversion N/A  07/03/2014    Procedure: TRANSESOPHAGEAL ECHOCARDIOGRAM (TEE);  Surgeon: Jerline Pain, MD;  Location: [facility] ENDOSCOPY;  Service: Cardiovascular;  Laterality: N/A;  . Excision of atrial myxoma N/A 07/10/2014    Procedure: RESECTION OF LEFT ATRIAL MASS;  Surgeon: Rexene Alberts, MD;  Location: [place];  Service: Open Heart Surgery;  Laterality: N/A;  . Tee without cardioversion N/A 07/10/2014    Procedure: TRANSESOPHAGEAL ECHOCARDIOGRAM (TEE);  Surgeon: Rexene Alberts, MD;  Location: [place];  Service: Open Heart Surgery;  Laterality: N/A;  . Repair of patent foramen ovale N/A 07/10/2014    Procedure: REPAIR OF PATENT FORAMEN OVALE;  Surgeon: Rexene Alberts, MD;  Location: [place];  Service: Open Heart Surgery;  Laterality: N/A;  . Coronary artery bypass graft N/A 07/10/2014    Procedure: CORONARY ARTERY BYPASS GRAFTING (CABG), ON PUMP, TIMES TWO, USING LEFT INTERNAL MAMMARY ARTERY, RIGHT GREATER SAPHENOUS VEIN HARVESTED ENDOSCOPICALLY;  Surgeon: Rexene Alberts, MD;  Location: [place];  Service: Open Heart Surgery;  Laterality: N/A;  . Colonoscopy      SH: Social History  Substance Use Topics  . Smoking status: Former Smoker    Quit date: 07/01/2014  . Smokeless tobacco: Never Used     Comment: Uses nicotiene losenges  . Alcohol Use: 0.0 oz/week    0 Glasses  of wine per week     Comment: rare    MEDS: Prior to Admission medications   Medication Sig Start Date End Date Taking? Authorizing Provider  amLODipine (NORVASC) 2.5 MG tablet Take 2.5 mg by mouth at bedtime.    Yes Historical Provider, MD  aspirin EC 325 MG EC tablet Take 1 tablet (325 mg total) by mouth daily. 07/14/14  Yes Donielle Liston Alba, PA-C  atorvastatin (LIPITOR) 80 MG tablet Take 1 tablet (80 mg total) by mouth daily at 6 PM. 07/14/14  Yes Donielle Liston Alba, PA-C  calcium carbonate (OS-CAL) 600 MG TABS tablet Take 600 mg by mouth daily.   Yes Historical Provider, MD  Cholecalciferol (VITAMIN D-3) 1000 UNITS CAPS Take 1,000 Units  by mouth daily.   Yes Historical Provider, MD  clopidogrel (PLAVIX) 75 MG tablet TAKE 1 TABLET DAILY  STARTING 10 DAYS PRIOR TO PROCEDURE 11/26/14  Yes Historical Provider, MD  hydrochlorothiazide (MICROZIDE) 12.5 MG capsule Take 1 capsule (12.5 mg total) by mouth daily. 10/15/14  Yes Fay Records, MD  KRILL OIL PO Take 1 tablet by mouth daily.   Yes Historical Provider, MD  LUTEIN PO Take 1 tablet by mouth daily.   Yes Historical Provider, MD  Multiple Vitamins-Minerals (EYE VITAMINS PO) Take 1 tablet by mouth daily.   Yes Historical Provider, MD  Multiple Vitamins-Minerals (MULTIVITAL) tablet Take 1 tablet by mouth daily.   Yes Historical Provider, MD  nicotine polacrilex (NICOTINE MINI) 2 MG lozenge Take 1 mg by mouth as needed for smoking cessation.    Yes Historical Provider, MD  oxybutynin (DITROPAN-XL) 10 MG 24 hr tablet Take 10 mg by mouth at bedtime.   Yes Historical Provider, MD  polyethylene glycol (MIRALAX / GLYCOLAX) packet Take 17 g by mouth daily as needed for mild constipation.   Yes Historical Provider, MD    ALLERGY: Allergies  Allergen Reactions  . Sulfa Antibiotics     Childhood allergy    ROS: ROS  NEUROLOGIC EXAM: Awake, alert, oriented Memory and concentration grossly intact Speech fluent, appropriate CN grossly intact Motor exam: Upper Extremities Deltoid Bicep Tricep Grip  Right 5/5 5/5 5/5 5/5  Left 5/5 5/5 5/5 5/5   Lower Extremity IP Quad PF DF EHL  Right 5/5 5/5 5/5 5/5 5/5  Left 5/5 5/5 5/5 5/5 5/5   Sensation grossly intact to LT   IMPRESSION: - 64 y.o. female with unruptured Acom aneurysm amenable to endovascular embolization  PLAN: - Proceed with coil embolization, possibly with stent support - ICU postop  I have reviewed the risks, benefits, and alternatives to treatment with the patient and her daughter in the office. All questions were answered.

## 2014-12-23 NOTE — Anesthesia Postprocedure Evaluation (Signed)
  Anesthesia Post-op Note  Patient: Hannah Nielsen  Procedure(s) Performed: Procedure(s) (LRB): Stent supported coil embolization OR right ACA aneurysm (N/A)  Patient Location: PACU  Anesthesia Type: General  Level of Consciousness: awake and alert   Airway and Oxygen Therapy: Patient Spontanous Breathing  Post-op Pain: mild  Post-op Assessment: Post-op Vital signs reviewed, Patient's Cardiovascular Status Stable, Respiratory Function Stable, Patent Airway and No signs of Nausea or vomiting  Last Vitals:  Filed Vitals:   12/23/14 1223  BP: 142/76  Pulse: 83  Temp: 36.4 C  Resp: 14    Post-op Vital Signs: stable   Complications: No apparent anesthesia complications

## 2014-12-24 ENCOUNTER — Encounter (HOSPITAL_COMMUNITY): Payer: Self-pay | Admitting: Neurosurgery

## 2014-12-24 ENCOUNTER — Encounter (HOSPITAL_COMMUNITY): Payer: Self-pay

## 2014-12-24 MED ORDER — CLOPIDOGREL BISULFATE 75 MG PO TABS: ORAL_TABLET | ORAL | Status: DC

## 2014-12-24 MED ORDER — ASPIRIN 325 MG PO TBEC: 325.0000 mg | DELAYED_RELEASE_TABLET | Freq: Every day | ORAL | Status: DC

## 2014-12-24 NOTE — Progress Notes (Signed)
Pt d/cd to home with daughter. Wheeled to car by Psychologist, occupational. All D/c instructions discussed and given in writing. Follow up appt with Dr. [name] made.

## 2014-12-24 NOTE — Discharge Summary (Signed)
  Physician Discharge Summary  Patient ID: Hannah Nielsen MRN: [id number] DOB/AGE: [birth year] 65 y.o.  Admit date: 12/23/2014 Discharge date: 12/24/2014  Admission Diagnoses: Cerebral aneurysm  Discharge Diagnoses: Same Active Problems:   Cerebral aneurysm   Discharged Condition: Stable  Hospital Course:  [name] is a 64 y.o. female admitted after elective stent-supported coil embolization of an Acom aneurysm. She was at baseline postop, ambulating well, tolerating diet, and voiding normally.  Treatments: Surgery - Stent-supported coil embolization of Acom aneurysm  Discharge Exam: Blood pressure 141/67, pulse 88, temperature 98.4 F (36.9 C), temperature source Oral, resp. rate 26, height 5\' 2"  (1.575 m), weight 87.2 kg (192 lb 3.9 oz), SpO2 94 %. Awake, alert, oriented Speech fluent, appropriate CN grossly intact 5/5 BUE/BLE Wound c/d/i, no hematoma  Disposition: 01-Home or Self Care     Medication List    TAKE these medications        amLODipine 2.5 MG tablet  Commonly known as:  NORVASC  Take 2.5 mg by mouth at bedtime.     aspirin 325 MG EC tablet  Take 1 tablet (325 mg total) by mouth daily.     atorvastatin 80 MG tablet  Commonly known as:  LIPITOR  Take 1 tablet (80 mg total) by mouth daily at 6 PM.     calcium carbonate 600 MG Tabs tablet  Commonly known as:  OS-CAL  Take 600 mg by mouth daily.     clopidogrel 75 MG tablet  Commonly known as:  PLAVIX  TAKE 1 TABLET DAILY  STARTING 10 DAYS PRIOR TO PROCEDURE     hydrochlorothiazide 12.5 MG capsule  Commonly known as:  MICROZIDE  Take 1 capsule (12.5 mg total) by mouth daily.     KRILL OIL PO  Take 1 tablet by mouth daily.     LUTEIN PO  Take 1 tablet by mouth daily.     MULTIVITAL tablet  Take 1 tablet by mouth daily.     EYE VITAMINS PO  Take 1 tablet by mouth daily.     NICOTINE MINI 2 MG lozenge  Generic drug:  nicotine polacrilex  Take 1 mg by mouth as needed for smoking  cessation.     oxybutynin 10 MG 24 hr tablet  Commonly known as:  DITROPAN-XL  Take 10 mg by mouth at bedtime.     polyethylene glycol packet  Commonly known as:  MIRALAX / GLYCOLAX  Take 17 g by mouth daily as needed for mild constipation.     Vitamin D-3 1000 UNITS Caps  Take 1,000 Units by mouth daily.           Follow-up Information    Follow up with Sabine Tenenbaum, C, MD In 4 weeks.   Specialty:  Neurosurgery   Contact information:   [address] [place] [address] [phone]       Signed: Consuella Lose, Loletha Grayer 12/24/2014, 9:00 AM

## 2014-12-25 ENCOUNTER — Encounter (HOSPITAL_COMMUNITY): Payer: Self-pay

## 2014-12-30 ENCOUNTER — Encounter (HOSPITAL_COMMUNITY): Payer: Self-pay

## 2014-12-31 ENCOUNTER — Encounter (HOSPITAL_COMMUNITY): Payer: Self-pay

## 2015-01-01 ENCOUNTER — Encounter (HOSPITAL_COMMUNITY): Payer: Self-pay

## 2015-01-01 ENCOUNTER — Ambulatory Visit (INDEPENDENT_AMBULATORY_CARE_PROVIDER_SITE_OTHER): Payer: Medicare PPO | Admitting: Internal Medicine

## 2015-01-01 ENCOUNTER — Encounter: Payer: Self-pay | Admitting: Internal Medicine

## 2015-01-01 VITALS — BP 146/86 | HR 87 | Ht 62.5 in | Wt 201.8 lb

## 2015-01-01 DIAGNOSIS — E785 Hyperlipidemia, unspecified: Secondary | ICD-10-CM

## 2015-01-01 DIAGNOSIS — I25810 Atherosclerosis of coronary artery bypass graft(s) without angina pectoris: Secondary | ICD-10-CM

## 2015-01-01 NOTE — Progress Notes (Signed)
Cardiology Office Note   Date:  01/01/2015   ID:  Hannah Nielsen, DOB 10-Mar-1951, MRN [id number]  PCP:  [name], MD  Cardiologist:   Dorris Carnes, MD   No chief complaint on file.  F/U of CAD   History of Present Illness: Hannah Nielsen is a 64 y.o. female with a history of HTN, tobacco abuse and CAD. 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 She underwent removal of fibroelastoma and CABG x 2 (LIMA TO LAD and SVG to RCA). In addition underwent PFO closure. This was in march 2016 I saw her in clinic in June Stopped cardiac rehab Had coinling of an aneurysm last week Sleep study in a few wks.   Denies CP  No SOB  No dizzienss  IS not exercising regularly     Current Outpatient Prescriptions  Medication Sig Dispense Refill  . clopidogrel (PLAVIX) 75 MG tablet TAKE 1 BY MOUTH TABLET DAILY  STARTING 10 DAYS PRIOR TO PROCEDURE    . amLODipine (NORVASC) 2.5 MG tablet Take 2.5 mg by mouth at bedtime.     Marland Kitchen atorvastatin (LIPITOR) 80 MG tablet Take 1 tablet (80 mg total) by mouth daily at 6 PM. 30 tablet 1  . calcium carbonate (OS-CAL) 600 MG TABS tablet Take 600 mg by mouth daily.    . Cholecalciferol (VITAMIN D-3) 1000 UNITS CAPS Take 1,000 Units by mouth daily.    . hydrochlorothiazide (MICROZIDE) 12.5 MG capsule Take 1 capsule (12.5 mg total) by mouth daily. 30 capsule 6  . KRILL OIL PO Take 1 tablet by mouth daily.    . LUTEIN PO Take 1 tablet by mouth daily.    . Multiple Vitamins-Minerals (MULTIVITAL) tablet Take 1 tablet by mouth daily.    . nicotine polacrilex (NICOTINE MINI) 2 MG lozenge Take 1 mg by mouth as directed. Smoking cessation    . oxybutynin (DITROPAN-XL) 10 MG 24 hr tablet Take 10 mg by mouth at bedtime.    . polyethylene glycol (MIRALAX / GLYCOLAX) packet Take 17 g by mouth daily as needed for mild constipation.     No current facility-administered medications for this visit.     Allergies:   Sulfa antibiotics   Past Medical History  Diagnosis Date  . 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 CAD (coronary artery disease), native coronary artery: Severe 2 vessel dz per cardiac cath 07/04/14 07/04/2014  . Left atrial mass 07/03/2014    benign papillary fibroelastoma  . Stroke 0/04/7492    acute embolic stroke to right MCA territory  . Hyperlipidemia 07/02/2014    takes Lipitor daily  . S/P resection of left atrial benign papillary fibroelastoma and CABG x 2 07/10/2014    LIMA to LAD, SVG to RCA, EVH via right thigh  . Papillary fibroelastoma of heart   . Constipation     takes Miralax daily as needed  . Nocturia     takes Ditropan nightly  . Hypertension     takes Amlodipine and HCTZ daily  . Shortness of breath dyspnea     with exertion   . Headache     rarely   . Tingling     left hand,left breast,and left hip  . GERD (gastroesophageal reflux disease)     rarely takes Tums  . History of colon polyps     benign  . Urinary frequency   .  Urinary urgency   . Cataract     left immature  . Insomnia     Past Surgical History  Procedure Laterality Date  . Abdominal hysterectomy    . Left heart catheterization with coronary angiogram N/A 07/04/2014    Procedure: LEFT HEART CATHETERIZATION WITH CORONARY ANGIOGRAM;  Surgeon: Jettie Booze, MD;  Location: [facility] CATH LAB;  Service: Cardiovascular;  Laterality: N/A;  . Tee without cardioversion N/A 07/03/2014    Procedure: TRANSESOPHAGEAL ECHOCARDIOGRAM (TEE);  Surgeon: Jerline Pain, MD;  Location: [facility] ENDOSCOPY;  Service: Cardiovascular;  Laterality: N/A;  . Excision of atrial myxoma N/A 07/10/2014    Procedure: RESECTION OF LEFT ATRIAL MASS;  Surgeon: Rexene Alberts, MD;  Location: [place];  Service: Open Heart Surgery;  Laterality: N/A;  . Tee without cardioversion N/A 07/10/2014    Procedure: TRANSESOPHAGEAL ECHOCARDIOGRAM (TEE);  Surgeon: Rexene Alberts, MD;   Location: [place];  Service: Open Heart Surgery;  Laterality: N/A;  . Repair of patent foramen ovale N/A 07/10/2014    Procedure: REPAIR OF PATENT FORAMEN OVALE;  Surgeon: Rexene Alberts, MD;  Location: [place];  Service: Open Heart Surgery;  Laterality: N/A;  . Coronary artery bypass graft N/A 07/10/2014    Procedure: CORONARY ARTERY BYPASS GRAFTING (CABG), ON PUMP, TIMES TWO, USING LEFT INTERNAL MAMMARY ARTERY, RIGHT GREATER SAPHENOUS VEIN HARVESTED ENDOSCOPICALLY;  Surgeon: Rexene Alberts, MD;  Location: [place];  Service: Open Heart Surgery;  Laterality: N/A;  . Colonoscopy    . Radiology with anesthesia N/A 12/23/2014    Procedure: Stent supported coil embolization OR right ACA aneurysm;  Surgeon: Consuella Lose, MD;  Location: [place] NEURO ORS;  Service: Radiology;  Laterality: N/A;     Social History:  The patient  reports that she quit smoking about 6 months ago. She has never used smokeless tobacco. She reports that she drinks alcohol. She reports that she does not use illicit drugs.   Family History:  The patient's family history includes Breast cancer in her maternal grandmother; Cerebral aneurysm (age of onset: 9) in her father; Heart attack in her daughter, maternal grandfather, and mother; Heart failure in her mother; Hypertension in her brother, mother, and sister. There is no history of Stroke.    ROS:  Please see the history of present illness. All other systems are reviewed and  Negative to the above problem except as noted.    PHYSICAL EXAM: VS:  There were no vitals taken for this visit.  GEN: Well nourished, well developed, in no acute distress HEENT: normal Neck: no JVD, carotid bruits, or masses Cardiac: RRR; no murmurs, rubs, or gallops,no edema  Respiratory:  clear to auscultation bilaterally, normal work of breathing GI: soft, nontender, nondistended, + BS  No hepatomegaly  MS: no deformity Moving all extremities   Skin: warm and dry, no rash Neuro:  Strength and  sensation are intact Psych: euthymic mood, full affect   EKG:  EKG is ordered today.   Lipid Panel    Component Value Date/Time   CHOL 112 09/22/2014 1035   TRIG 117.0 09/22/2014 1035   HDL 37.70* 09/22/2014 1035   CHOLHDL 3 09/22/2014 1035   VLDL 23.4 09/22/2014 1035   LDLCALC 51 09/22/2014 1035      Wt Readings from Last 3 Encounters:  12/23/14 192 lb 3.9 oz (87.2 kg)  12/16/14 201 lb 4.5 oz (91.3 kg)  12/09/14 200 lb (90.719 kg)      ASSESSMENT AND PLAN:   1.  CAD Doing well post CABG   No changes    2.  Neuro  Hx CVA  S/p removal of fibroelastoma   She just had coiling of small aneurysm.  Neuro is following    3.  HL  Good control of LDL in June  No changes  I would f/u in March  Sooner for problems.      Signed, Dorris Carnes, MD  01/01/2015 4:10 PM    [facility] [place], [place], [place]  [address] Phone: [phone]; Fax: [phone]

## 2015-01-01 NOTE — Patient Instructions (Signed)
Your physician recommends that you continue on your current medications as directed. Please refer to the Current Medication list given to you today.  Your physician wants you to follow-up in: 6 MONTHS WITH DR. ROSS.   You will receive a reminder letter in the mail two months in advance. If you don't receive a letter, please call our office to schedule the follow-up appointment.  

## 2015-01-06 ENCOUNTER — Encounter (HOSPITAL_COMMUNITY): Payer: Self-pay

## 2015-01-07 ENCOUNTER — Encounter (HOSPITAL_COMMUNITY): Payer: Self-pay

## 2015-01-08 ENCOUNTER — Encounter (HOSPITAL_COMMUNITY): Payer: Self-pay

## 2015-01-14 DIAGNOSIS — I671 Cerebral aneurysm, nonruptured: Secondary | ICD-10-CM | POA: Diagnosis not present

## 2015-01-18 ENCOUNTER — Ambulatory Visit (INDEPENDENT_AMBULATORY_CARE_PROVIDER_SITE_OTHER): Payer: Medicare PPO | Admitting: Neurology

## 2015-01-18 DIAGNOSIS — G4761 Periodic limb movement disorder: Secondary | ICD-10-CM

## 2015-01-18 DIAGNOSIS — G4730 Sleep apnea, unspecified: Secondary | ICD-10-CM

## 2015-01-18 DIAGNOSIS — G479 Sleep disorder, unspecified: Secondary | ICD-10-CM

## 2015-01-18 DIAGNOSIS — G4720 Circadian rhythm sleep disorder, unspecified type: Secondary | ICD-10-CM

## 2015-01-18 DIAGNOSIS — G4733 Obstructive sleep apnea (adult) (pediatric): Secondary | ICD-10-CM

## 2015-01-18 DIAGNOSIS — G4710 Hypersomnia, unspecified: Secondary | ICD-10-CM

## 2015-01-18 NOTE — Sleep Study (Signed)
Please see the scanned sleep study interpretation located in the Procedure tab within the Chart Review section. 

## 2015-01-22 ENCOUNTER — Telehealth: Payer: Self-pay | Admitting: Neurology

## 2015-01-22 DIAGNOSIS — G4761 Periodic limb movement disorder: Secondary | ICD-10-CM

## 2015-01-22 DIAGNOSIS — G4733 Obstructive sleep apnea (adult) (pediatric): Secondary | ICD-10-CM

## 2015-01-22 NOTE — Telephone Encounter (Signed)
Patient referred by Dr. Leonie Man and Jinny Blossom, seen by me on 12/09/14, diagnostic PSG on 01/18/15, ins: [employer] MCR.   Please call and notify the patient that the recent sleep study did confirm the diagnosis of moderate obstructive sleep apnea and that I recommend treatment for this in the form of CPAP. This will require a repeat sleep study for proper titration and mask fitting. Please explain to patient and arrange for a CPAP titration study. I have placed an order in the chart. Thanks, and please route to [facility] for scheduling next sleep study.  Star Age, MD, PhD [facility])

## 2015-01-22 NOTE — Telephone Encounter (Signed)
I spoke to patient and gave results and recommendations. She would like to proceed with titration study. I will fax report to PCP.

## 2015-01-30 DIAGNOSIS — Z1231 Encounter for screening mammogram for malignant neoplasm of breast: Secondary | ICD-10-CM | POA: Diagnosis not present

## 2015-02-08 ENCOUNTER — Ambulatory Visit (INDEPENDENT_AMBULATORY_CARE_PROVIDER_SITE_OTHER): Payer: Medicare PPO | Admitting: Neurology

## 2015-02-08 DIAGNOSIS — G4720 Circadian rhythm sleep disorder, unspecified type: Secondary | ICD-10-CM

## 2015-02-08 DIAGNOSIS — G4733 Obstructive sleep apnea (adult) (pediatric): Secondary | ICD-10-CM | POA: Diagnosis not present

## 2015-02-08 DIAGNOSIS — G479 Sleep disorder, unspecified: Secondary | ICD-10-CM

## 2015-02-08 NOTE — Sleep Study (Signed)
Please see the scanned sleep study interpretation located in the Procedure tab within the Chart Review section. 

## 2015-02-12 ENCOUNTER — Telehealth: Payer: Self-pay | Admitting: Neurology

## 2015-02-12 DIAGNOSIS — G4733 Obstructive sleep apnea (adult) (pediatric): Secondary | ICD-10-CM

## 2015-02-12 NOTE — Telephone Encounter (Signed)
Patient referred by Dr. Leonie Man and Jinny Blossom, seen by me on 12/09/14, diagnostic PSG on 01/18/15, CPAP study on 02/08/15, ins: [employer] MCR.  Please call and inform patient that I have entered an order for treatment with positive airway pressure (PAP) treatment of obstructive sleep apnea (OSA). She did well during the latest sleep study with CPAP. We will, therefore, arrange for a machine for home use through a DME (durable medical equipment) company of Her choice; and I will see the patient back in follow-up in about 8-10 weeks. Please also explain to the patient that I will be looking out for compliance data, which can be downloaded from the machine (stored on an SD card, that is inserted in the machine) or via remote access through a modem, that is built into the machine. At the time of the followup appointment we will discuss sleep study results and how it is going with PAP treatment at home. Please advise patient to bring Her machine at the time of the first FU visit, even though this is cumbersome. Bringing the machine for every visit after that will likely not be needed, but often helps for the first visit to troubleshoot if needed. Please re-enforce the importance of compliance with treatment and the need for [place] to monitor compliance data - often an insurance requirement and actually good feedback for the patient as far as how they are doing.  Also remind patient, that any interim PAP machine or mask issues should be first addressed with the DME company, as they can often help better with technical and mask fit issues. Please ask if patient has a preference regarding DME company.  Please also make sure, the patient has a follow-up appointment with me in about 8-10 weeks from the setup date, thanks.  Once you have spoken to the patient - and faxed/routed report to PCP and referring MD (if other than PCP), you can close this encounter, thanks,   Star Age, MD, PhD [facility] ([place])

## 2015-02-12 NOTE — Telephone Encounter (Signed)
I spoke to patient and she is aware of results and recommendation. She would like to proceed with treatment. She requested [facility]. I will refer there. We made f/u appt. I will send to PCP. I will also mail patient letter reminding her to keep appt and stress the importance of compliance.

## 2015-02-18 DIAGNOSIS — G4733 Obstructive sleep apnea (adult) (pediatric): Secondary | ICD-10-CM | POA: Diagnosis not present

## 2015-03-20 DIAGNOSIS — G4733 Obstructive sleep apnea (adult) (pediatric): Secondary | ICD-10-CM | POA: Diagnosis not present

## 2015-03-26 DIAGNOSIS — J069 Acute upper respiratory infection, unspecified: Secondary | ICD-10-CM | POA: Diagnosis not present

## 2015-04-15 ENCOUNTER — Ambulatory Visit (INDEPENDENT_AMBULATORY_CARE_PROVIDER_SITE_OTHER): Payer: Medicare Other | Admitting: Neurology

## 2015-04-15 ENCOUNTER — Encounter: Payer: Self-pay | Admitting: Neurology

## 2015-04-15 VITALS — BP 130/78 | HR 68 | Ht 62.5 in | Wt 203.6 lb

## 2015-04-15 DIAGNOSIS — I671 Cerebral aneurysm, nonruptured: Secondary | ICD-10-CM | POA: Diagnosis not present

## 2015-04-15 NOTE — Progress Notes (Signed)
PATIENT: Hannah Nielsen DOB: 08/31/1950  REASON FOR VISIT: follow up HISTORY FROM: patient  HISTORY OF PRESENT ILLNESS: Ms. Meikle is a 65 year old female with a history of CVA on 07/01/2014. The patient presented to the ED with left hand numbness. She went to bed that night and woke up around 2 AM and felt that her left hand was numb and clumsy. She drove herself to the emergency room. The MRI indicated that she had a precentral cortex infarct. The patient was out of the window for TPA. A TEE was completed and an intracardiac mass as well as a probable PFO was discovered. Later in her hospital visit she had surgery to remove the mass and close the PFO. Patient also had carotid Dopplers that indicated bilateral ECAs with increased velocity consistent with severe stenosis. Her hemoglobin A1c was 6.3. Patient's LDL was 112. Also while in the hospital they found a right ACA Aneurysm 4.4 mm. The patient has a family history of ruptured aneurysms and therefore was referred to Dr. Ralene Ok for follow-up. The patient was discharged on Lipitor for her cholesterol. She was discharged with aspirin 325 mg 4 stroke prevention. The patient returns today for evaluation. She continues on aspirin for secondary stroke prevention. She is also on Lipitor for her cholesterol. Her latest cholesterol numbers were excellent. Patient states that her blood pressure has been controlled. She is currently taking metoprolol and Norvasc. The patient never received a referral to Dr.Nunkumar in regards to her aneurysm. She states that this is the most concerning thing for her. She states that her father died of a ruptured aneurysm and of course she wants to try to prevent that from happening to her. She states that since this stroke most of her symptoms have resolved. She does have a patch of numbness on the left side of the chest and the left thigh. She states that the numbness in her hands have improved but she still has a tingling  feeling in the ring and pinky finger. Patient also reports that she does snore at night. She does not feel that she sleeps well during the night. She gets up 3-4 times to urinate. She does report daytime sleepiness. Her Epworth sleepiness scale is 15 and fatigue severity scale is 44. She states that her sister has been diagnosed with sleep apnea. She is concerned that this may be the same for her. She denies any additional stroke like symptoms. She no longer smokes cigarettes. She is currently participating  in cardiac rehabilitation. She returns today for an evaluation. Update 04/15/2015 : She returns for follow-up after last visit 6 months ago. She continues to do well without recurrent stroke or TIA symptoms. She had uneventful anterior communicating artery aneurysm stent assisted coiling by Dr. [name] in September 2016. She was on aspirin and Plavix but has recently stopped the Plavix  and is currently on aspirin alone. She states her blood pressure is well controlled and today it is 130/78. She remains on Lipitor 80 mg daily which is tolerating well without side effects. She is not sure when she had her last lipid profile checked. She remains on CPAP for her sleep apnea and is tolerating it well and has regular follow-up appointments with Dr. Rexene Alberts. She has no new neurological complaints today. She has been careful with her diet but has not been exercising a lot and realizes that she has to lose weight REVIEW OF SYSTEMS: Out of a complete 14 system review of symptoms, the patient complains  only of the following symptoms, and all other reviewed systems are negative.  Constipation, excessive thirst, apnea, environmental allergies  ALLERGIES: Allergies  Allergen Reactions  . Sulfa Antibiotics     Childhood allergy - unknown    HOME MEDICATIONS: Outpatient Prescriptions Prior to Visit  Medication Sig Dispense Refill  . amLODipine (NORVASC) 2.5 MG tablet Take 2.5 mg by mouth at bedtime.     Marland Kitchen  aspirin 325 MG tablet Take 325 mg by mouth daily.    Marland Kitchen atorvastatin (LIPITOR) 80 MG tablet Take 1 tablet (80 mg total) by mouth daily at 6 PM. 30 tablet 1  . calcium carbonate (OS-CAL) 600 MG TABS tablet Take 600 mg by mouth daily.    . Cholecalciferol (VITAMIN D-3) 1000 UNITS CAPS Take 1,000 Units by mouth daily.    . hydrochlorothiazide (MICROZIDE) 12.5 MG capsule Take 1 capsule (12.5 mg total) by mouth daily. 30 capsule 6  . KRILL OIL PO Take 1 tablet by mouth daily.    . LUTEIN PO Take 1 tablet by mouth daily.    . Multiple Vitamins-Minerals (MULTIVITAL) tablet Take 1 tablet by mouth daily.    . nicotine polacrilex (NICOTINE MINI) 2 MG lozenge Take 1 mg by mouth as directed. Smoking cessation    . oxybutynin (DITROPAN-XL) 10 MG 24 hr tablet Take 10 mg by mouth at bedtime.    . polyethylene glycol (MIRALAX / GLYCOLAX) packet Take 17 g by mouth daily as needed for mild constipation.    . clopidogrel (PLAVIX) 75 MG tablet Reported on 04/15/2015     No facility-administered medications prior to visit.    PAST MEDICAL HISTORY: Past Medical History  Diagnosis Date  . 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 CAD (coronary artery disease), native coronary artery: Severe 2 vessel dz per cardiac cath 07/04/14 07/04/2014  . Left atrial mass 07/03/2014    benign papillary fibroelastoma  . Stroke ([place]) 0000000    acute embolic stroke to right MCA territory  . Hyperlipidemia 07/02/2014    takes Lipitor daily  . S/P resection of left atrial benign papillary fibroelastoma and CABG x 2 07/10/2014    LIMA to LAD, SVG to RCA, EVH via right thigh  . Papillary fibroelastoma of heart   . Constipation     takes Miralax daily as needed  . Nocturia     takes Ditropan nightly  . Hypertension     takes Amlodipine and HCTZ daily  . Shortness of breath dyspnea     with exertion   . Headache     rarely   . Tingling     left hand,left breast,and left hip  . GERD  (gastroesophageal reflux disease)     rarely takes Tums  . History of colon polyps     benign  . Urinary frequency   . Urinary urgency   . Cataract     left immature  . Insomnia     PAST SURGICAL HISTORY: Past Surgical History  Procedure Laterality Date  . Abdominal hysterectomy    . Left heart catheterization with coronary angiogram N/A 07/04/2014    Procedure: LEFT HEART CATHETERIZATION WITH CORONARY ANGIOGRAM;  Surgeon: Jettie Booze, MD;  Location: [facility] CATH LAB;  Service: Cardiovascular;  Laterality: N/A;  . Tee without cardioversion N/A 07/03/2014    Procedure: TRANSESOPHAGEAL ECHOCARDIOGRAM (TEE);  Surgeon: Jerline Pain, MD;  Location: [place];  Service: Cardiovascular;  Laterality: N/A;  . Excision  of atrial myxoma N/A 07/10/2014    Procedure: RESECTION OF LEFT ATRIAL MASS;  Surgeon: Rexene Alberts, MD;  Location: [place];  Service: Open Heart Surgery;  Laterality: N/A;  . Tee without cardioversion N/A 07/10/2014    Procedure: TRANSESOPHAGEAL ECHOCARDIOGRAM (TEE);  Surgeon: Rexene Alberts, MD;  Location: [place];  Service: Open Heart Surgery;  Laterality: N/A;  . Repair of patent foramen ovale N/A 07/10/2014    Procedure: REPAIR OF PATENT FORAMEN OVALE;  Surgeon: Rexene Alberts, MD;  Location: [place];  Service: Open Heart Surgery;  Laterality: N/A;  . Coronary artery bypass graft N/A 07/10/2014    Procedure: CORONARY ARTERY BYPASS GRAFTING (CABG), ON PUMP, TIMES TWO, USING LEFT INTERNAL MAMMARY ARTERY, RIGHT GREATER SAPHENOUS VEIN HARVESTED ENDOSCOPICALLY;  Surgeon: Rexene Alberts, MD;  Location: [place];  Service: Open Heart Surgery;  Laterality: N/A;  . Colonoscopy    . Radiology with anesthesia N/A 12/23/2014    Procedure: Stent supported coil embolization OR right ACA aneurysm;  Surgeon: Consuella Lose, MD;  Location: [place] NEURO ORS;  Service: Radiology;  Laterality: N/A;    FAMILY HISTORY: Family History  Problem Relation Age of Onset  . Hypertension Mother   . Heart  failure Mother     Deceased  . Heart attack Mother   . Hypertension Sister   . Hypertension Brother   . Cerebral aneurysm Father 51    Deceased  . Aneurysm Father   . Stroke Neg Hx   . Heart attack Daughter   . Breast cancer Maternal Grandmother   . Heart attack Maternal Grandfather     SOCIAL HISTORY: Social History   Social History  . Marital Status: Divorced    Spouse Name: N/A  . Number of Children: 3  . Years of Education: college   Occupational History  .      retired Pharmacist, hospital   Social History Main Topics  . Smoking status: Former Smoker    Quit date: 07/01/2014  . Smokeless tobacco: Never Used     Comment: Uses nicotiene losenges  . Alcohol Use: 0.0 oz/week    0 Glasses of wine per week     Comment: rare  . Drug Use: No  . Sexual Activity: Not on file   Other Topics Concern  . Not on file   Social History Narrative   Patient lives at home alone and she is divorced.   Retired Pharmacist, hospital.   Education college.   Right handed.   Caffeine coffee two cups.      PHYSICAL EXAM  Filed Vitals:   04/15/15 0831  BP: 130/78  Pulse: 68  Height: 5' 2.5" (1.588 m)  Weight: 203 lb 9.6 oz (92.352 kg)   Body mass index is 36.62 kg/(m^2).  Generalized: Pleasant middle-age obese Caucasian lady, in no acute distress Neck; supple. No bruit  Neurological examination  Mentation: Alert oriented to time, place, history taking. Follows all commands speech and language fluent Cranial nerve II-XII: Pupils were equal round reactive to light. Extraocular movements were full, visual field were full on confrontational test. Facial sensation and strength were normal. Uvula tongue midline. Head turning and shoulder shrug  were normal and symmetric. Motor: The motor testing reveals 5 over 5 strength of all 4 extremities. Good symmetric motor tone is noted throughout.  Sensory: Sensory testing is intact to soft touch on all 4 extremities. No evidence of extinction is noted.    Coordination: Cerebellar testing reveals good finger-nose-finger and heel-to-shin bilaterally.  Gait and station: Gait is normal. Tandem gait is normal. Romberg is negative. No drift is seen.  Reflexes: Deep tendon reflexes are symmetric and normal bilaterally.    DIAGNOSTIC DATA (LABS, IMAGING, TESTING) - I reviewed patient records, labs, notes, testing and imaging myself where available.  Lab Results  Component Value Date   WBC 8.3 12/23/2014   HGB 12.8 12/23/2014   HCT 39.0 12/23/2014   MCV 89.7 12/23/2014   PLT 285 12/23/2014      Component Value Date/Time   NA 136 12/16/2014 0839   K 4.0 12/16/2014 0839   CL 104 12/16/2014 0839   CO2 23 12/16/2014 0839   GLUCOSE 100* 12/16/2014 0839   BUN 18 12/16/2014 0839   CREATININE 0.70 12/23/2014 1303   CALCIUM 9.6 12/16/2014 0839   PROT 7.0 09/22/2014 1035   ALBUMIN 3.9 09/22/2014 1035   AST 24 09/22/2014 1035   ALT 37* 09/22/2014 1035   ALKPHOS 118* 09/22/2014 1035   BILITOT 0.4 09/22/2014 1035   GFRNONAA >60 12/23/2014 1303   GFRAA >60 12/23/2014 1303   Lab Results  Component Value Date   CHOL 112 09/22/2014   HDL 37.70* 09/22/2014   LDLCALC 51 09/22/2014   TRIG 117.0 09/22/2014   CHOLHDL 3 09/22/2014   Lab Results  Component Value Date   HGBA1C 6.1* 07/08/2014      ASSESSMENT AND PLAN 65 y.o. year old female  has a past medical history of Aneurysm of anterior cerebral artery: Right per MRI 07/01/14 (07/02/2014); CAD (coronary artery disease), native coronary artery: Severe 2 vessel dz per cardiac cath 07/04/14 (07/04/2014); Left atrial mass (07/03/2014); Stroke [facility]) (07/01/2014); Hyperlipidemia (07/02/2014); S/P resection of left atrial benign papillary fibroelastoma and CABG x 2 (07/10/2014); Papillary fibroelastoma of heart; Constipation; Nocturia; Hypertension; Shortness of breath dyspnea; Headache; Tingling; GERD (gastroesophageal reflux disease); History of colon polyps; Urinary frequency; Urinary urgency; Cataract;  and Insomnia. here with:  1. History of stroke 2. Cerebral aneurysm 3. Excessive daytime sleepiness 4. Snoring  I had a long d/w patient about her remote stroke, risk for recurrent stroke/TIAs, personally independently reviewed imaging studies and stroke evaluation results and answered questions.Continue aspirin 325 mg daily  for secondary stroke prevention and maintain strict control of hypertension with blood pressure goal below 130/90, diabetes with hemoglobin A1c goal below 6.5% and lipids with LDL cholesterol goal below 70 mg/dL. I also advised the patient to eat a healthy diet with plenty of whole grains, cereals, fruits and vegetables, exercise regularly and maintain ideal body weight. She was also advised to be compliant with her CPAP machine for sleep apnea And follow-up with Dr. [name] for aneurysm follow-up and with Dr. Star Age for sleep apnea. Followup in the future with Ward Givens, nurse practitioner in one year or call earlier if necessary.  Antony Contras, MD  04/15/2015, 8:56 AM [facility] [address] [place], [place] [address] [phone]  Note: This document was prepared with digital dictation and possible smart phrase technology. Any transcriptional errors that result from this process are unintentional.

## 2015-04-15 NOTE — Patient Instructions (Signed)
I had a long d/w patient about her remote stroke, risk for recurrent stroke/TIAs, personally independently reviewed imaging studies and stroke evaluation results and answered questions.Continue aspirin 325 mg daily  for secondary stroke prevention and maintain strict control of hypertension with blood pressure goal below 130/90, diabetes with hemoglobin A1c goal below 6.5% and lipids with LDL cholesterol goal below 70 mg/dL. I also advised the patient to eat a healthy diet with plenty of whole grains, cereals, fruits and vegetables, exercise regularly and maintain ideal body weight. And follow-up with Dr. [name] for aneurysm follow-up and with Dr. Star Age for sleep apnea. Followup in the future with Ward Givens, nurse practitioner in one year or call earlier if necessary.

## 2015-04-16 ENCOUNTER — Ambulatory Visit (INDEPENDENT_AMBULATORY_CARE_PROVIDER_SITE_OTHER): Payer: Medicare Other | Admitting: Neurology

## 2015-04-16 ENCOUNTER — Encounter: Payer: Self-pay | Admitting: Neurology

## 2015-04-16 VITALS — BP 128/88 | HR 68 | Resp 16 | Ht 62.5 in | Wt 203.0 lb

## 2015-04-16 DIAGNOSIS — G4733 Obstructive sleep apnea (adult) (pediatric): Secondary | ICD-10-CM | POA: Diagnosis not present

## 2015-04-16 DIAGNOSIS — E669 Obesity, unspecified: Secondary | ICD-10-CM

## 2015-04-16 DIAGNOSIS — Z9989 Dependence on other enabling machines and devices: Principal | ICD-10-CM

## 2015-04-16 NOTE — Progress Notes (Signed)
Subjective:    Patient ID: Hannah Nielsen is a 64 y.o. female.  HPI     Interim history:   Hannah Nielsen is a 64-year-old right-handed woman with an underlying medical history of hypertension, ACA aneurysm (s/p coiling under Dr. Nundkumar on 12/23/14), coronary artery disease, status post 2 vessel CABG on 07/10/2014, papillary fibroblastoma of the heart, stroke in March 2016, hyperlipidemia, recent smoking cessation, and obesity, who presents for follow-up consultation of her obstructive sleep apnea, after her recent sleep studies. The patient is unaccompanied today. I first met her on 12/09/2014 at the request of Megan Millikan and Dr. Sethi, at which time the patient reported snoring, witnessed apneic breathing pauses and excessive daytime somnolence. I invited her back for sleep study. She had a baseline sleep study, followed by a CPAP titration study. I went over her test results with her in detail today. Her baseline sleep study from 01/18/2015 showed a sleep efficiency of 57.4% with a latency to sleep of 24 minutes and wake after sleep onset of 139 minutes with mild to moderate sleep fragmentation noted. She had an elevated arousal index. She had moderate PLMS with an index of 33 per hour, resulting in 11.7 arousals per hour. She had an increased percentage of light stage sleep, absence of slow-wave sleep and a decreased percentage of REM sleep with a prolonged REM latency. Average oxygen saturation was 93%, nadir was 87%. She had mild to moderate snoring. Total AHI was 15.5 per hour. Based on her sleep-related complaints and her sleep study results, I invited her back for a full night CPAP titration study. She had this on 02/08/2015. Sleep efficiency was 76.4% with a latency to sleep of 40 minutes and wake after sleep onset of 68.5 minutes with mild to moderate sleep fragmentation noted. She had an arousal index of 10 per hour. She had an increased percentage of stage II sleep, absence of slow-wave sleep,  and a decreased percentage of REM sleep at 14.2% with a prolonged REM latency of 130 minutes. She had no significant PLMS, EKG or EEG changes. Average oxygen saturation was 92%, nadir was 89%. CPAP was titrated from 5 cm to 11 cm. AHI was 3.7 per hour on the pressure of 9 cm. She had evidence of increase in central apneas on pressure is being on 9 cm. Based on her test results are prescribed CPAP therapy for home use.  Today, 04/16/2015: I reviewed her CPAP compliance data from 03/16/2015 through 04/14/2015 which is a total of 30 days during which time she used her machine every night with percent used days greater than 4 hours at 100%, indicating superb compliance with an average usage of 7 hours and 45 minutes, residual AHI 2.4 per hour, leak low with the 95th percentile at 4.1 L/m on a pressure of 9 cm with EPR of 2.   Today, 04/16/2015: She reports doing well. She feels very pleased with her aneurysm coiling results. She also feels improved with her sleep. She is compliant with CPAP. She preferred the full facemask and changed from nasal pillows. She has noticed improved daytime energy and better sleep quality. She has no new complaints. She has no residual symptoms from her stroke in March last year. Thankfully, she also quit smoking at the time.  Previously:   12/09/2014: She reports snoring, witnessed apneas per daughter (recently shared a hotel room) and excessive daytime somnolence. As far as her stroke is concerned. She had woken up with a numbness and weakness of her   left hand. Her weakness has improved and she has residual numbness in her left hand, lateral aspect. She is using nicotine lozenges and has quit smoking on 07/01/2014 at the time of her stroke diagnosis. She drinks alcohol very occasionally. She drinks coffee 2 cups a day and no sodas and typically no other caffeine-containing beverages. She tries to drink water. She has no known family history of obstructive sleep apnea but does have  a family history of early or sudden death. Her father snored heavily. She reports a bedtime of around 9 or 10 PM and a rise time around 4:30 or 5. She typically does not wake up rested and her Epworth sleepiness score is elevated at 15 out of 24 today, her fatigue score is 42 out of 63. She lives alone. She is a retired kindergarten and first grade teacher. She is divorced 2. She has 3 grown children from her first marriage. She has a family history of brain aneurysm in her father who died when she was 10 years old from a ruptured brain aneurysm and his mother, her paternal grandmother had a brain aneurysm as well. Dr. Paula Ross is her cardiologist, Dr. Owen is her CT surgeon. She denies frank restless leg symptoms and is not known to twitch her legs and her sleep. She does wake up frequently in the middle of the night. She has to use the bathroom on average 2-3 times per night.  Her Past Medical History Is Significant For: Past Medical History  Diagnosis Date  . 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 CAD (coronary artery disease), native coronary artery: Severe 2 vessel dz per cardiac cath 07/04/14 07/04/2014  . Left atrial mass 07/03/2014    benign papillary fibroelastoma  . Stroke (HCC) 07/01/2014    acute embolic stroke to right MCA territory  . Hyperlipidemia 07/02/2014    takes Lipitor daily  . S/P resection of left atrial benign papillary fibroelastoma and CABG x 2 07/10/2014    LIMA to LAD, SVG to RCA, EVH via right thigh  . Papillary fibroelastoma of heart   . Constipation     takes Miralax daily as needed  . Nocturia     takes Ditropan nightly  . Hypertension     takes Amlodipine and HCTZ daily  . Shortness of breath dyspnea     with exertion   . Headache     rarely   . Tingling     left hand,left breast,and left hip  . GERD (gastroesophageal reflux disease)     rarely takes Tums  . History of colon polyps     benign  . Urinary  frequency   . Urinary urgency   . Cataract     left immature  . Insomnia     Her Past Surgical History Is Significant For: Past Surgical History  Procedure Laterality Date  . Abdominal hysterectomy    . Left heart catheterization with coronary angiogram N/A 07/04/2014    Procedure: LEFT HEART CATHETERIZATION WITH CORONARY ANGIOGRAM;  Surgeon: Jayadeep S Varanasi, MD;  Location: [facility];  Service: Cardiovascular;  Laterality: N/A;  . Tee without cardioversion N/A 07/03/2014    Procedure: TRANSESOPHAGEAL ECHOCARDIOGRAM (TEE);  Surgeon: Mark C Skains, MD;  Location: [facility] ENDOSCOPY;  Service: Cardiovascular;  Laterality: N/A;  . Excision of atrial myxoma N/A 07/10/2014    Procedure: RESECTION OF LEFT ATRIAL MASS;  Surgeon: Clarence H Owen, MD;  Location: [facility] OR;    Service: Open Heart Surgery;  Laterality: N/A;  . Tee without cardioversion N/A 07/10/2014    Procedure: TRANSESOPHAGEAL ECHOCARDIOGRAM (TEE);  Surgeon: Rexene Alberts, MD;  Location: [place];  Service: Open Heart Surgery;  Laterality: N/A;  . Repair of patent foramen ovale N/A 07/10/2014    Procedure: REPAIR OF PATENT FORAMEN OVALE;  Surgeon: Rexene Alberts, MD;  Location: [place];  Service: Open Heart Surgery;  Laterality: N/A;  . Coronary artery bypass graft N/A 07/10/2014    Procedure: CORONARY ARTERY BYPASS GRAFTING (CABG), ON PUMP, TIMES TWO, USING LEFT INTERNAL MAMMARY ARTERY, RIGHT GREATER SAPHENOUS VEIN HARVESTED ENDOSCOPICALLY;  Surgeon: Rexene Alberts, MD;  Location: [place];  Service: Open Heart Surgery;  Laterality: N/A;  . Colonoscopy    . Radiology with anesthesia N/A 12/23/2014    Procedure: Stent supported coil embolization OR right ACA aneurysm;  Surgeon: Consuella Lose, MD;  Location: [place] [facility];  Service: Radiology;  Laterality: N/A;    Her Family History Is Significant For: Family History  Problem Relation Age of Onset  . Hypertension Mother   . Heart failure Mother     Deceased  . Heart attack Mother   .  Hypertension Sister   . Hypertension Brother   . Cerebral aneurysm Father 74    Deceased  . Aneurysm Father   . Stroke Neg Hx   . Heart attack Daughter   . Breast cancer Maternal Grandmother   . Heart attack Maternal Grandfather     Her Social History Is Significant For: Social History   Social History  . Marital Status: Divorced    Spouse Name: N/A  . Number of Children: 3  . Years of Education: college   Occupational History  .      retired Pharmacist, hospital   Social History Main Topics  . Smoking status: Former Smoker    Quit date: 07/01/2014  . Smokeless tobacco: Never Used     Comment: Uses nicotiene losenges  . Alcohol Use: 0.0 oz/week    0 Glasses of wine per week     Comment: rare  . Drug Use: No  . Sexual Activity: Not Asked   Other Topics Concern  . None   Social History Narrative   Patient lives at home alone and she is divorced.   Retired Pharmacist, hospital.   Education college.   Right handed.   Caffeine coffee two cups.    Her Allergies Are:  Allergies  Allergen Reactions  . Sulfa Antibiotics     Childhood allergy - unknown  :   Her Current Medications Are:  Outpatient Encounter Prescriptions as of 04/16/2015  Medication Sig  . amLODipine (NORVASC) 2.5 MG tablet Take 2.5 mg by mouth at bedtime.   Marland Kitchen aspirin 325 MG tablet Take 325 mg by mouth daily.  Marland Kitchen atorvastatin (LIPITOR) 80 MG tablet Take 1 tablet (80 mg total) by mouth daily at 6 PM.  . calcium carbonate (OS-CAL) 600 MG TABS tablet Take 600 mg by mouth daily.  . Cholecalciferol (VITAMIN D-3) 1000 UNITS CAPS Take 1,000 Units by mouth daily.  Marland Kitchen FLUZONE HIGH-DOSE 0.5 ML SUSY ADM 0.5ML IM UTD  . hydrochlorothiazide (MICROZIDE) 12.5 MG capsule Take 1 capsule (12.5 mg total) by mouth daily.  Marland Kitchen KRILL OIL PO Take 1 tablet by mouth daily.  . LUTEIN PO Take 1 tablet by mouth daily.  . Multiple Vitamins-Minerals (MULTIVITAL) tablet Take 1 tablet by mouth daily.  . nicotine polacrilex (NICOTINE MINI) 2 MG lozenge Take  1  mg by mouth as directed. Smoking cessation  . oxybutynin (DITROPAN-XL) 10 MG 24 hr tablet Take 10 mg by mouth at bedtime.  . polyethylene glycol (MIRALAX / GLYCOLAX) packet Take 17 g by mouth daily as needed for mild constipation.   No facility-administered encounter medications on file as of 04/16/2015.  :  Review of Systems:  Out of a complete 14 point review of systems, all are reviewed and negative with the exception of these symptoms as listed below:   Review of Systems  Neurological:       Patient is here for CPAP f/u. No new concerns or complaints.     Objective:  Neurologic Exam  Physical Exam Physical Examination:   Filed Vitals:   04/16/15 1258  BP: 128/88  Pulse: 68  Resp: 16   General Examination: The patient is a very pleasant 64 y.o. female in no acute distress. She appears well-developed and well-nourished and well groomed. She is obese. She is in good spirits today.  HEENT: Normocephalic, atraumatic, pupils are equal, round and reactive to light and accommodation. Funduscopic exam is normal with sharp disc margins noted. Extraocular tracking is good without limitation to gaze excursion or nystagmus noted. Normal smooth pursuit is noted. Hearing is grossly intact. She has mild bilateral cataracts. Face is symmetric with normal facial animation and normal facial sensation. Speech is clear with no dysarthria noted. There is no hypophonia. There is no lip, neck/head, jaw or voice tremor. Neck is supple with full range of passive and active motion. There are no carotid bruits on auscultation. Oropharynx exam reveals: mild mouth dryness, adequate dental hygiene and moderate airway crowding, due to thicker tongue, redundant soft palate, large uvula, and redundant. Tonsils are small. Mallampati is class III. Neck circumference is 15 inches. Tongue protrudes centrally and palate elevates symmetrically. Nasal inspection reveals no significant nasal mucosal bogginess or redness and  no septal deviation.   Chest: Clear to auscultation without wheezing, rhonchi or crackles noted.  Heart: S1+S2+0, regular and normal without murmurs, rubs or gallops noted.   Abdomen: Soft, non-tender and non-distended with normal bowel sounds appreciated on auscultation.  Extremities: There is trace pitting edema in the distal lower extremities bilaterally. Pedal pulses are intact.  Skin: Warm and dry without trophic changes noted. There are no varicose veins.  Musculoskeletal: exam reveals no obvious joint deformities, tenderness or joint swelling or erythema.   Neurologically:  Mental status: The patient is awake, alert and oriented in all 4 spheres. Her immediate and remote memory, attention, language skills and fund of knowledge are appropriate. There is no evidence of aphasia, agnosia, apraxia or anomia. Speech is clear with normal prosody and enunciation. Thought process is linear. Mood is normal and affect is normal.  Cranial nerves II - XII are as described above under HEENT exam. In addition: shoulder shrug is normal with equal shoulder height noted. Motor exam: Normal bulk, strength and tone is noted. There is no drift, tremor or rebound. Romberg is negative. Reflexes are 2+ throughout. Fine motor skills and coordination: intact with normal finger taps, normal hand movements, normal rapid alternating patting, normal foot taps and normal foot agility.  Cerebellar testing: No dysmetria or intention tremor on finger to nose testing. Heel to shin is unremarkable bilaterally. There is no truncal or gait ataxia.  Sensory exam: intact to light touch, pinprick, vibration, temperature sense in the upper and lower extremities. Gait, station and balance: She stands easily. No veering to one side is noted. No leaning   to one side is noted. Posture is age-appropriate and stance is narrow based. Gait shows normal stride length and normal pace. No problems turning are noted. She turns en bloc. Tandem  walk is slightly difficult, but better than last time.   Assessment and plan:   In summary, Hannah Nielsen is a very pleasant 65 year old female with an underlying medical history of hypertension, ACA aneurysm (s/p elective stent-supported coil embolization of A com aneurysm under Dr. [name] on 12/23/14), coronary artery disease, status post 2 vessel CABG on 07/10/2014, papillary fibroblastoma of the heart, stroke in March 2016, hyperlipidemia, smoking cessation in March 2016, and obesity, who presents for follow-up consultation of her obstructive sleep apnea. She had a baseline sleep study and CPAP titration study in October 2016. She has moderate obstructive sleep apnea, well treated with CPAP at 9 cm with full compliance and good results reported. She is congratulated on her treatment adherence. She is advised about her sleep study results in detail and we also talked about her compliance data and I explained the findings. Her physical exam is stable. I again explained the risks and ramifications of untreated moderate to severe OSA, especially with respect to developing cardiovascular disease down the Road, including congestive heart failure, difficult to treat hypertension, cardiac arrhythmias, or stroke. Even type 2 diabetes has, in part, been linked to untreated OSA. Symptoms of untreated OSA include daytime sleepiness, memory problems, mood irritability and mood disorder such as depression and anxiety, lack of energy, as well as recurrent headaches, especially morning headaches. We talked about maintaining a healthy lifestyle in general, as well as the importance of weight control. I explained the importance of being compliant with PAP treatment, not only for insurance purposes but primarily to improve Her symptoms, and for the patient's long term health benefit, including to reduce Her cardiovascular risks. I would like to see her back in 6 months, sooner if the need arises and if she continues to do  well I can see her back yearly for sleep apnea checkup. I answered all her questions today and the patient was in agreement. I spent 25 minutes in total face-to-face time with the patient, more than 50% of which was spent in counseling and coordination of care, reviewing test results, reviewing medication and discussing or reviewing the diagnosis of OSA, its prognosis and treatment options.

## 2015-04-16 NOTE — Patient Instructions (Signed)

## 2015-04-22 ENCOUNTER — Ambulatory Visit: Payer: Self-pay | Admitting: Neurology

## 2015-06-11 ENCOUNTER — Other Ambulatory Visit (HOSPITAL_COMMUNITY): Payer: Self-pay | Admitting: Neurosurgery

## 2015-06-11 ENCOUNTER — Other Ambulatory Visit: Payer: Self-pay | Admitting: Neurosurgery

## 2015-06-11 DIAGNOSIS — I671 Cerebral aneurysm, nonruptured: Secondary | ICD-10-CM

## 2015-06-18 ENCOUNTER — Other Ambulatory Visit: Payer: Self-pay | Admitting: Gastroenterology

## 2015-07-06 ENCOUNTER — Ambulatory Visit (INDEPENDENT_AMBULATORY_CARE_PROVIDER_SITE_OTHER): Payer: Medicare Other | Admitting: Internal Medicine

## 2015-07-06 ENCOUNTER — Encounter: Payer: Self-pay | Admitting: Internal Medicine

## 2015-07-06 VITALS — BP 142/86 | HR 68 | Ht 62.5 in | Wt 205.1 lb

## 2015-07-06 DIAGNOSIS — I10 Essential (primary) hypertension: Secondary | ICD-10-CM

## 2015-07-06 DIAGNOSIS — E785 Hyperlipidemia, unspecified: Secondary | ICD-10-CM

## 2015-07-06 LAB — CBC
HCT: 40.2 % (ref 36.0–46.0)
HEMOGLOBIN: 13.7 g/dL (ref 12.0–15.0)
MCH: 29.8 pg (ref 26.0–34.0)
MCHC: 34.1 g/dL (ref 30.0–36.0)
MCV: 87.4 fL (ref 78.0–100.0)
MPV: 9.6 fL (ref 8.6–12.4)
PLATELETS: 332 10*3/uL (ref 150–400)
RBC: 4.6 MIL/uL (ref 3.87–5.11)
RDW: 13.6 % (ref 11.5–15.5)
WBC: 9 10*3/uL (ref 4.0–10.5)

## 2015-07-06 LAB — LIPID PANEL
CHOL/HDL RATIO: 3 ratio (ref <=5.0)
CHOLESTEROL: 126 mg/dL (ref 125–200)
HDL: 42 mg/dL — AB (ref >=46)
LDL Cholesterol: 60 mg/dL (ref <130)
Triglycerides: 122 mg/dL (ref <150)
VLDL: 24 mg/dL (ref <30)

## 2015-07-06 LAB — BASIC METABOLIC PANEL
BUN: 17 mg/dL (ref 7–25)
CO2: 29 mmol/L (ref 20–31)
CREATININE: 0.66 mg/dL (ref 0.50–0.99)
Calcium: 9.5 mg/dL (ref 8.6–10.4)
Chloride: 103 mmol/L (ref 98–110)
Glucose, Bld: 97 mg/dL (ref 65–99)
POTASSIUM: 3.9 mmol/L (ref 3.5–5.3)
SODIUM: 139 mmol/L (ref 135–146)

## 2015-07-06 MED ORDER — FUROSEMIDE 40 MG PO TABS: 40.0000 mg | ORAL_TABLET | Freq: Every day | ORAL | Status: DC

## 2015-07-06 MED ORDER — AMLODIPINE BESYLATE 5 MG PO TABS: 5.0000 mg | ORAL_TABLET | Freq: Every day | ORAL | Status: DC

## 2015-07-06 NOTE — Patient Instructions (Addendum)
Your physician has recommended you make the following change in your medication:  1.) increase amlodipine to 5 mg once daily 2.) lasix 40 mg as directed by Dr. Harrington Challenger  Your physician recommends that you return for lab work in: today (bmet, cbc, lipids)  Your physician wants you to follow-up in: October, 2017 with Dr. Harrington Challenger.  You will receive a reminder letter in the mail two months in advance. If you don't receive a letter, please call our office to schedule the follow-up appointment.  Low-Sodium Eating Plan Sodium raises blood pressure and causes water to be held in the body. Getting less sodium from food will help lower your blood pressure, reduce any swelling, and protect your heart, liver, and kidneys. We get sodium by adding salt (sodium chloride) to food. Most of our sodium comes from canned, boxed, and frozen foods. Restaurant foods, fast foods, and pizza are also very high in sodium. Even if you take medicine to lower your blood pressure or to reduce fluid in your body, getting less sodium from your food is important. WHAT IS MY PLAN? Most people should limit their sodium intake to 2,300 mg a day. Your health care provider recommends that you limit your sodium intake to __________ a day.  WHAT DO I NEED TO KNOW ABOUT THIS EATING PLAN? For the low-sodium eating plan, you will follow these general guidelines:  Choose foods with a % Daily Value for sodium of less than 5% (as listed on the food label).   Use salt-free seasonings or herbs instead of table salt or sea salt.   Check with your health care provider or pharmacist before using salt substitutes.   Eat fresh foods.  Eat more vegetables and fruits.  Limit canned vegetables. If you do use them, rinse them well to decrease the sodium.   Limit cheese to 1 oz (28 g) per day.   Eat lower-sodium products, often labeled as "lower sodium" or "no salt added."  Avoid foods that contain monosodium glutamate (MSG). MSG is sometimes  added to [place] food and some canned foods.  Check food labels (Nutrition Facts labels) on foods to learn how much sodium is in one serving.  Eat more home-cooked food and less restaurant, buffet, and fast food.  When eating at a restaurant, ask that your food be prepared with less salt, or no salt if possible.  HOW DO I READ FOOD LABELS FOR SODIUM INFORMATION? The Nutrition Facts label lists the amount of sodium in one serving of the food. If you eat more than one serving, you must multiply the listed amount of sodium by the number of servings. Food labels may also identify foods as:  Sodium free--Less than 5 mg in a serving.  Very low sodium--35 mg or less in a serving.  Low sodium--140 mg or less in a serving.  Light in sodium--50% less sodium in a serving. For example, if a food that usually has 300 mg of sodium is changed to become light in sodium, it will have 150 mg of sodium.  Reduced sodium--25% less sodium in a serving. For example, if a food that usually has 400 mg of sodium is changed to reduced sodium, it will have 300 mg of sodium. WHAT FOODS CAN I EAT? Grains Low-sodium cereals, including oats, puffed wheat and rice, and shredded wheat cereals. Low-sodium crackers. Unsalted rice and pasta. Lower-sodium bread.  Vegetables Frozen or fresh vegetables. Low-sodium or reduced-sodium canned vegetables. Low-sodium or reduced-sodium tomato sauce and paste. Low-sodium or reduced-sodium  tomato and vegetable juices.  Fruits Fresh, frozen, and canned fruit. Fruit juice.  Meat and Other Protein Products Low-sodium canned tuna and salmon. Fresh or frozen meat, poultry, seafood, and fish. Lamb. Unsalted nuts. Dried beans, peas, and lentils without added salt. Unsalted canned beans. Homemade soups without salt. Eggs.  Dairy Milk. Soy milk. Ricotta cheese. Low-sodium or reduced-sodium cheeses. Yogurt.  Condiments Fresh and dried herbs and spices. Salt-free seasonings. Onion  and garlic powders. Low-sodium varieties of mustard and ketchup. Fresh or refrigerated horseradish. Lemon juice.  Fats and Oils Reduced-sodium salad dressings. Unsalted butter.  Other Unsalted popcorn and pretzels.  The items listed above may not be a complete list of recommended foods or beverages. Contact your dietitian for more options. WHAT FOODS ARE NOT RECOMMENDED? Grains Instant hot cereals. Bread stuffing, pancake, and biscuit mixes. Croutons. Seasoned rice or pasta mixes. Noodle soup cups. Boxed or frozen macaroni and cheese. Self-rising flour. Regular salted crackers. Vegetables Regular canned vegetables. Regular canned tomato sauce and paste. Regular tomato and vegetable juices. Frozen vegetables in sauces. Salted [place] fries. Olives. Angie Fava. Relishes. Sauerkraut. Salsa. Meat and Other Protein Products Salted, canned, smoked, spiced, or pickled meats, seafood, or fish. Bacon, ham, sausage, hot dogs, corned beef, chipped beef, and packaged luncheon meats. Salt pork. Jerky. Pickled herring. Anchovies, regular canned tuna, and sardines. Salted nuts. Dairy Processed cheese and cheese spreads. Cheese curds. Blue cheese and cottage cheese. Buttermilk.  Condiments Onion and garlic salt, seasoned salt, table salt, and sea salt. Canned and packaged gravies. Worcestershire sauce. Tartar sauce. Barbecue sauce. Teriyaki sauce. Soy sauce, including reduced sodium. Steak sauce. Fish sauce. Oyster sauce. Cocktail sauce. Horseradish that you find on the shelf. Regular ketchup and mustard. Meat flavorings and tenderizers. Bouillon cubes. Hot sauce. Tabasco sauce. Marinades. Taco seasonings. Relishes. Fats and Oils Regular salad dressings. Salted butter. Margarine. Ghee. Bacon fat.  Other Potato and tortilla chips. Corn chips and puffs. Salted popcorn and pretzels. Canned or dried soups. Pizza. Frozen entrees and pot pies.  The items listed above may not be a complete list of foods and  beverages to avoid. Contact your dietitian for more information.   This information is not intended to replace advice given to you by your health care provider. Make sure you discuss any questions you have with your health care provider.   Document Released: 09/17/2001 Document Revised: 04/18/2014 Document Reviewed: 01/30/2013 Elsevier Interactive Patient Education [employer].

## 2015-07-06 NOTE — Progress Notes (Signed)
Cardiology Office Note   Date:  07/06/2015   ID:  Hannah Nielsen, DOB April 29, 1950, MRN [id number]  PCP:  [name], MD  Cardiologist:   Dorris Carnes, MD   F/U of CAD      History of Present Illness: Hannah Nielsen is a 65 y.o. female with a history of  Hannah Nielsen is a 65 y.o. female with a history of HTN, tobacco abuse and CAD. 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 She underwent removal of fibroelastoma and CABG x 2 (LIMA TO LAD and SVG to RCA). In addition underwent PFO closure. This was in march 2016 I saw her in clinic in September 2016  SInce seen she says she has done OK  No CP  Breathing is OK except with stairs.  Trying to walk some    Quit tobacco  Using patch   Outpatient Prescriptions Prior to Visit  Medication Sig Dispense Refill  . amLODipine (NORVASC) 2.5 MG tablet Take 2.5 mg by mouth at bedtime.     Marland Kitchen aspirin 325 MG tablet Take 325 mg by mouth daily.    Marland Kitchen atorvastatin (LIPITOR) 80 MG tablet Take 1 tablet (80 mg total) by mouth daily at 6 PM. 30 tablet 1  . calcium carbonate (OS-CAL) 600 MG TABS tablet Take 600 mg by mouth daily.    . Cholecalciferol (VITAMIN D-3) 1000 UNITS CAPS Take 1,000 Units by mouth daily.    Marland Kitchen FLUZONE HIGH-DOSE 0.5 ML SUSY ADM 0.5ML IM UTD  0  . hydrochlorothiazide (MICROZIDE) 12.5 MG capsule Take 1 capsule (12.5 mg total) by mouth daily. 30 capsule 6  . KRILL OIL PO Take 1 tablet by mouth daily.    . LUTEIN PO Take 1 tablet by mouth daily.    . Multiple Vitamins-Minerals (MULTIVITAL) tablet Take 1 tablet by mouth daily.    . nicotine polacrilex (NICOTINE MINI) 2 MG lozenge Take 1 mg by mouth as directed. Smoking cessation    . oxybutynin (DITROPAN-XL) 10 MG 24 hr tablet Take 10 mg by mouth at bedtime.    . polyethylene glycol (MIRALAX / GLYCOLAX) packet Take 17 g by mouth daily as needed for mild constipation.     No facility-administered medications prior  to visit.     Allergies:   Sulfa antibiotics   Past Medical History  Diagnosis Date  . 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 CAD (coronary artery disease), native coronary artery: Severe 2 vessel dz per cardiac cath 07/04/14 07/04/2014  . Left atrial mass 07/03/2014    benign papillary fibroelastoma  . Stroke ([place]) 0000000    acute embolic stroke to right MCA territory  . Hyperlipidemia 07/02/2014    takes Lipitor daily  . S/P resection of left atrial benign papillary fibroelastoma and CABG x 2 07/10/2014    LIMA to LAD, SVG to RCA, EVH via right thigh  . Papillary fibroelastoma of heart   . Constipation     takes Miralax daily as needed  . Nocturia     takes Ditropan nightly  . Hypertension     takes Amlodipine and HCTZ daily  . Shortness of breath dyspnea     with exertion   . Headache     rarely   . Tingling     left hand,left breast,and left hip  . GERD (gastroesophageal reflux disease)     rarely takes  Tums  . History of colon polyps     benign  . Urinary frequency   . Urinary urgency   . Cataract     left immature  . Insomnia     Past Surgical History  Procedure Laterality Date  . Abdominal hysterectomy    . Left heart catheterization with coronary angiogram N/A 07/04/2014    Procedure: LEFT HEART CATHETERIZATION WITH CORONARY ANGIOGRAM;  Surgeon: Jettie Booze, MD;  Location: [facility] CATH LAB;  Service: Cardiovascular;  Laterality: N/A;  . Tee without cardioversion N/A 07/03/2014    Procedure: TRANSESOPHAGEAL ECHOCARDIOGRAM (TEE);  Surgeon: Jerline Pain, MD;  Location: [facility] ENDOSCOPY;  Service: Cardiovascular;  Laterality: N/A;  . Excision of atrial myxoma N/A 07/10/2014    Procedure: RESECTION OF LEFT ATRIAL MASS;  Surgeon: Rexene Alberts, MD;  Location: [place];  Service: Open Heart Surgery;  Laterality: N/A;  . Tee without cardioversion N/A 07/10/2014    Procedure: TRANSESOPHAGEAL ECHOCARDIOGRAM (TEE);  Surgeon:  Rexene Alberts, MD;  Location: [place];  Service: Open Heart Surgery;  Laterality: N/A;  . Repair of patent foramen ovale N/A 07/10/2014    Procedure: REPAIR OF PATENT FORAMEN OVALE;  Surgeon: Rexene Alberts, MD;  Location: [place];  Service: Open Heart Surgery;  Laterality: N/A;  . Coronary artery bypass graft N/A 07/10/2014    Procedure: CORONARY ARTERY BYPASS GRAFTING (CABG), ON PUMP, TIMES TWO, USING LEFT INTERNAL MAMMARY ARTERY, RIGHT GREATER SAPHENOUS VEIN HARVESTED ENDOSCOPICALLY;  Surgeon: Rexene Alberts, MD;  Location: [place];  Service: Open Heart Surgery;  Laterality: N/A;  . Colonoscopy    . Radiology with anesthesia N/A 12/23/2014    Procedure: Stent supported coil embolization OR right ACA aneurysm;  Surgeon: Consuella Lose, MD;  Location: [place] NEURO ORS;  Service: Radiology;  Laterality: N/A;     Social History:  The patient  reports that she quit smoking about a year ago. She has never used smokeless tobacco. She reports that she drinks alcohol. She reports that she does not use illicit drugs.   Family History:  The patient's family history includes Aneurysm in her father; Breast cancer in her maternal grandmother; Cerebral aneurysm (age of onset: 28) in her father; Heart attack in her daughter, maternal grandfather, and mother; Heart failure in her mother; Hypertension in her brother, mother, and sister. There is no history of Stroke.    ROS:  Please see the history of present illness. All other systems are reviewed and  Negative to the above problem except as noted.    PHYSICAL EXAM: VS:  BP 142/86 mmHg  Ht 5' 2.5" (1.588 m)  Wt 205 lb 1.9 oz (93.042 kg)  BMI 36.90 kg/m2  GEN: Well nourished, well developed, in no acute distress HEENT: normal Neck: no JVD, carotid bruits, or masses Cardiac: RRR; no murmurs, rubs, or gallops,no edema  Respiratory:  clear to auscultation bilaterally, normal work of breathing GI: soft, nontender, nondistended, + BS  No hepatomegaly  MS: no  deformity Moving all extremities   Skin: warm and dry, no rash Neuro:  Strength and sensation are intact Psych: euthymic mood, full affect   EKG:  EKG is ordered today.  SR 67 bpm    Lipid Panel    Component Value Date/Time   CHOL 112 09/22/2014 1035   TRIG 117.0 09/22/2014 1035   HDL 37.70* 09/22/2014 1035   CHOLHDL 3 09/22/2014 1035   VLDL 23.4 09/22/2014 1035   LDLCALC 51 09/22/2014 1035  Wt Readings from Last 3 Encounters:  07/06/15 205 lb 1.9 oz (93.042 kg)  04/16/15 203 lb (92.08 kg)  04/15/15 203 lb 9.6 oz (92.352 kg)      ASSESSMENT AND PLAN:  1  CAD  No sympotms of angina  2.  HTN  INcrease amlodipine  BP 140/82     3.  Edema  Triv on exam  WIll give limited lasix 40    4  HL  Check lipids   5  Tob  Congrat on tobacco  F/U in October of next fall   Recomm increased activity      Signed, Dorris Carnes, MD  07/06/2015 8:29 AM    [facility] [address], [place], [place]  [address] Phone: [phone]; Fax: [phone]

## 2015-07-08 ENCOUNTER — Encounter: Payer: Self-pay | Admitting: Internal Medicine

## 2015-07-08 NOTE — Telephone Encounter (Signed)
This encounter was created in error - please disregard.

## 2015-07-08 NOTE — Telephone Encounter (Signed)
New message  Pt returned the call to speak with the nurse

## 2015-07-20 ENCOUNTER — Ambulatory Visit: Payer: Medicare PPO | Admitting: Thoracic Surgery (Cardiothoracic Vascular Surgery)

## 2015-07-27 ENCOUNTER — Encounter: Payer: Self-pay | Admitting: Thoracic Surgery (Cardiothoracic Vascular Surgery)

## 2015-07-27 ENCOUNTER — Other Ambulatory Visit: Payer: Self-pay | Admitting: Internal Medicine

## 2015-07-27 ENCOUNTER — Ambulatory Visit (INDEPENDENT_AMBULATORY_CARE_PROVIDER_SITE_OTHER): Payer: Medicare Other | Admitting: Thoracic Surgery (Cardiothoracic Vascular Surgery)

## 2015-07-27 VITALS — BP 146/79 | HR 70 | Resp 20 | Ht 62.5 in | Wt 207.0 lb

## 2015-07-27 DIAGNOSIS — Z951 Presence of aortocoronary bypass graft: Secondary | ICD-10-CM

## 2015-07-27 DIAGNOSIS — R222 Localized swelling, mass and lump, trunk: Secondary | ICD-10-CM

## 2015-07-27 DIAGNOSIS — I5189 Other ill-defined heart diseases: Secondary | ICD-10-CM

## 2015-07-27 NOTE — Patient Instructions (Signed)
Continue all previous medications without any changes at this time  Make every effort to stay physically active, get some type of exercise on a regular basis, and stick to a "heart healthy diet".  The long term benefits for regular exercise and a healthy diet are critically important to your overall health and wellbeing.  

## 2015-07-27 NOTE — Progress Notes (Signed)
[address]       [place] [address]             [phone]     CARDIOTHORACIC SURGERY OFFICE NOTE  Referring Provider is Jerline Pain, MD  Primary Cardiologist is Fay Records, MD PCP is [name], MD   HPI:  Patient returns to the office today for routine follow-up approximately one year status post resection of left atrial mass, coronary artery bypass grafting 2, and closure of patent foramen ovale on 07/10/2014. She originally presented with a stroke on 07/01/2014.  Her early postoperative recovery was uneventful and she was seen most recently in our office on 10/20/2014. Since then she underwent uncomplicated coil embolization of her right ACA intracerebral aneurysm.  She was seen recently in follow-up by her primary cardiologist and she returns to our office for routine follow-up today. She reports doing very well. She describes stable symptoms of exertional shortness of breath that occur only with relatively strenuous physical exertion. She never gets any chest pain or chest tightness with exertion. She does not exercise on a regular basis. She has managed to quit smoking completely. Overall she feels quite well.   Current Outpatient Prescriptions  Medication Sig Dispense Refill  . amLODipine (NORVASC) 5 MG tablet Take 1 tablet (5 mg total) by mouth daily. 90 tablet 3  . aspirin 325 MG tablet Take 325 mg by mouth daily.    Marland Kitchen atorvastatin (LIPITOR) 80 MG tablet Take 1 tablet (80 mg total) by mouth daily at 6 PM. 30 tablet 1  . calcium carbonate (OS-CAL) 600 MG TABS tablet Take 600 mg by mouth daily.    . Cholecalciferol (VITAMIN D-3) 1000 UNITS CAPS Take 1,000 Units by mouth daily.    . furosemide (LASIX) 40 MG tablet Take 1 tablet (40 mg total) by mouth daily. (Patient taking differently: Take 40 mg by mouth daily as needed. ) 30 tablet 0  . hydrochlorothiazide (MICROZIDE) 12.5 MG capsule Take 1 capsule (12.5 mg total) by mouth daily. 30  capsule 6  . KRILL OIL PO Take 1 tablet by mouth daily.    . LUTEIN PO Take 1 tablet by mouth daily.    . Multiple Vitamins-Minerals (MULTIVITAL) tablet Take 1 tablet by mouth daily.    . nicotine polacrilex (NICOTINE MINI) 2 MG lozenge Take 1 mg by mouth as directed. Smoking cessation    . oxybutynin (DITROPAN-XL) 10 MG 24 hr tablet Take 10 mg by mouth at bedtime.    . polyethylene glycol (MIRALAX / GLYCOLAX) packet Take 17 g by mouth daily as needed for mild constipation.     No current facility-administered medications for this visit.      Physical Exam:   BP 146/79 mmHg  Pulse 70  Resp 20  Ht 5' 2.5" (1.588 m)  Wt 207 lb (93.895 kg)  BMI 37.23 kg/m2  SpO2 93%  General:  Well-appearing  Chest:   Clear to auscultation  CV:   Regular rate and rhythm without murmur  Incisions:  Completely healed, sternum is stable  Abdomen:  Soft and nontender  Extremities:  Warm and well-perfused  Diagnostic Tests:  n/a   Impression:  Patient is doing very well approximately one year status post coronary artery bypass grafting 2, resection of left atrial papillary fibro-elastoma, and closure of patent foramen ovale.  Plan:  The patient has been encouraged to find a way to make a regular exerciser part of her life. The  impact of heart healthy diet and lifestyle has been discussed. We have not recommended any changes to the patient's current medications. She has been congratulated on her ability to abstain from tobacco use. In the future she will call and return to see [place] as needed.  I spent in excess of 10 minutes during the conduct of this office consultation and >50% of this time involved direct face-to-face encounter with the patient for counseling and/or coordination of their care.   Valentina Gu. Roxy Manns, MD 07/27/2015 10:51 AM

## 2015-07-28 ENCOUNTER — Ambulatory Visit (HOSPITAL_COMMUNITY)
Admission: RE | Admit: 2015-07-28 | Discharge: 2015-07-28 | Disposition: A | Discharge status: Home / Self Care | Payer: Medicare Other | Source: Ambulatory Visit | Attending: Neurosurgery | Admitting: Neurosurgery

## 2015-07-28 ENCOUNTER — Other Ambulatory Visit (HOSPITAL_COMMUNITY): Payer: Self-pay | Admitting: Neurosurgery

## 2015-07-28 DIAGNOSIS — E785 Hyperlipidemia, unspecified: Secondary | ICD-10-CM | POA: Insufficient documentation

## 2015-07-28 DIAGNOSIS — I2510 Atherosclerotic heart disease of native coronary artery without angina pectoris: Secondary | ICD-10-CM | POA: Diagnosis not present

## 2015-07-28 DIAGNOSIS — Z951 Presence of aortocoronary bypass graft: Secondary | ICD-10-CM | POA: Insufficient documentation

## 2015-07-28 DIAGNOSIS — Z882 Allergy status to sulfonamides status: Secondary | ICD-10-CM | POA: Diagnosis not present

## 2015-07-28 DIAGNOSIS — K219 Gastro-esophageal reflux disease without esophagitis: Secondary | ICD-10-CM | POA: Insufficient documentation

## 2015-07-28 DIAGNOSIS — Z9889 Other specified postprocedural states: Secondary | ICD-10-CM | POA: Insufficient documentation

## 2015-07-28 DIAGNOSIS — I671 Cerebral aneurysm, nonruptured: Secondary | ICD-10-CM

## 2015-07-28 DIAGNOSIS — Z86010 Personal history of colonic polyps: Secondary | ICD-10-CM | POA: Diagnosis not present

## 2015-07-28 DIAGNOSIS — Z87891 Personal history of nicotine dependence: Secondary | ICD-10-CM | POA: Insufficient documentation

## 2015-07-28 DIAGNOSIS — I10 Essential (primary) hypertension: Secondary | ICD-10-CM | POA: Insufficient documentation

## 2015-07-28 DIAGNOSIS — Z7982 Long term (current) use of aspirin: Secondary | ICD-10-CM | POA: Insufficient documentation

## 2015-07-28 DIAGNOSIS — Z8673 Personal history of transient ischemic attack (TIA), and cerebral infarction without residual deficits: Secondary | ICD-10-CM | POA: Insufficient documentation

## 2015-07-28 LAB — CBC WITH DIFFERENTIAL/PLATELET
BASOS ABS: 0 10*3/uL (ref 0.0–0.1)
BASOS PCT: 0 %
EOS ABS: 0.1 10*3/uL (ref 0.0–0.7)
Eosinophils Relative: 1 %
HEMATOCRIT: 40.9 % (ref 36.0–46.0)
Hemoglobin: 13.8 g/dL (ref 12.0–15.0)
Lymphocytes Relative: 29 %
Lymphs Abs: 2.6 10*3/uL (ref 0.7–4.0)
MCH: 30.2 pg (ref 26.0–34.0)
MCHC: 33.7 g/dL (ref 30.0–36.0)
MCV: 89.5 fL (ref 78.0–100.0)
MONO ABS: 0.6 10*3/uL (ref 0.1–1.0)
MONOS PCT: 7 %
NEUTROS ABS: 5.7 10*3/uL (ref 1.7–7.7)
NEUTROS PCT: 63 %
Platelets: 298 10*3/uL (ref 150–400)
RBC: 4.57 MIL/uL (ref 3.87–5.11)
RDW: 13.3 % (ref 11.5–15.5)
WBC: 9.2 10*3/uL (ref 4.0–10.5)

## 2015-07-28 LAB — BASIC METABOLIC PANEL
ANION GAP: 11 (ref 5–15)
BUN: 18 mg/dL (ref 6–20)
CALCIUM: 9.1 mg/dL (ref 8.9–10.3)
CO2: 25 mmol/L (ref 22–32)
CREATININE: 0.74 mg/dL (ref 0.44–1.00)
Chloride: 104 mmol/L (ref 101–111)
GLUCOSE: 127 mg/dL — AB (ref 65–99)
Potassium: 3.8 mmol/L (ref 3.5–5.1)
Sodium: 140 mmol/L (ref 135–145)

## 2015-07-28 LAB — PROTIME-INR
INR: 1.12 (ref 0.00–1.49)
Prothrombin Time: 14.5 seconds (ref 11.6–15.2)

## 2015-07-28 LAB — APTT: APTT: 28 s (ref 24–37)

## 2015-07-28 MED ORDER — MIDAZOLAM HCL 2 MG/2ML IJ SOLN
INTRAMUSCULAR | Status: AC
  Filled 2015-07-28: qty 2

## 2015-07-28 MED ORDER — IOPAMIDOL (ISOVUE-300) INJECTION 61%
INTRAVENOUS | Status: AC
  Administered 2015-07-28: 40 mL
  Filled 2015-07-28: qty 100

## 2015-07-28 MED ORDER — HEPARIN SODIUM (PORCINE) 1000 UNIT/ML IJ SOLN
INTRAMUSCULAR | Status: AC
  Filled 2015-07-28: qty 1

## 2015-07-28 MED ORDER — MIDAZOLAM HCL 2 MG/2ML IJ SOLN
INTRAMUSCULAR | Status: AC | PRN
  Administered 2015-07-28: 1 mg via INTRAVENOUS

## 2015-07-28 MED ORDER — SODIUM CHLORIDE 0.9 % IV SOLN: INTRAVENOUS | Status: DC

## 2015-07-28 MED ORDER — FENTANYL CITRATE (PF) 100 MCG/2ML IJ SOLN
INTRAMUSCULAR | Status: AC | PRN
  Administered 2015-07-28: 25 ug via INTRAVENOUS

## 2015-07-28 MED ORDER — HYDROCODONE-ACETAMINOPHEN 5-325 MG PO TABS: 1.0000 | ORAL_TABLET | ORAL | Status: DC | PRN

## 2015-07-28 MED ORDER — IOPAMIDOL (ISOVUE-300) INJECTION 61%
INTRAVENOUS | Status: AC
  Filled 2015-07-28: qty 150

## 2015-07-28 MED ORDER — SODIUM CHLORIDE 0.9 % IV SOLN
INTRAVENOUS | Status: AC | PRN
  Administered 2015-07-28: 10 mL/h via INTRAVENOUS

## 2015-07-28 MED ORDER — HEPARIN SODIUM (PORCINE) 1000 UNIT/ML IJ SOLN
INTRAMUSCULAR | Status: AC | PRN
  Administered 2015-07-28: 2000 [IU] via INTRAVENOUS

## 2015-07-28 MED ORDER — SODIUM CHLORIDE 0.9 % IV SOLN
INTRAVENOUS | Status: DC
  Administered 2015-07-28: 10:00:00 via INTRAVENOUS

## 2015-07-28 MED ORDER — LIDOCAINE HCL 1 % IJ SOLN
INTRAMUSCULAR | Status: AC
  Administered 2015-07-28: 8 mL
  Filled 2015-07-28: qty 20

## 2015-07-28 MED ORDER — FENTANYL CITRATE (PF) 100 MCG/2ML IJ SOLN
INTRAMUSCULAR | Status: AC
  Filled 2015-07-28: qty 2

## 2015-07-28 NOTE — H&P (Signed)
CC:  Aneurysm f/u  HPI: Hannah Nielsen is a 65 y.o. female with a history of ACA aneurysm who underwent stent-supported coiling about 6 months ago. She has done very well and presents today for routine f/u. She is off plavix, and currently is maintained on ASA.  PMH: Past Medical History  Diagnosis Date  . 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 CAD (coronary artery disease), native coronary artery: Severe 2 vessel dz per cardiac cath 07/04/14 07/04/2014  . Left atrial mass 07/03/2014    benign papillary fibroelastoma  . Stroke ([place]) 0000000    acute embolic stroke to right MCA territory  . Hyperlipidemia 07/02/2014    takes Lipitor daily  . S/P resection of left atrial benign papillary fibroelastoma and CABG x 2 07/10/2014    LIMA to LAD, SVG to RCA, EVH via right thigh  . Papillary fibroelastoma of heart   . Constipation     takes Miralax daily as needed  . Nocturia     takes Ditropan nightly  . Hypertension     takes Amlodipine and HCTZ daily  . Shortness of breath dyspnea     with exertion   . Headache     rarely   . Tingling     left hand,left breast,and left hip  . GERD (gastroesophageal reflux disease)     rarely takes Tums  . History of colon polyps     benign  . Urinary frequency   . Urinary urgency   . Cataract     left immature  . Insomnia     PSH: Past Surgical History  Procedure Laterality Date  . Abdominal hysterectomy    . Left heart catheterization with coronary angiogram N/A 07/04/2014    Procedure: LEFT HEART CATHETERIZATION WITH CORONARY ANGIOGRAM;  Surgeon: Jettie Booze, MD;  Location: [facility] CATH LAB;  Service: Cardiovascular;  Laterality: N/A;  . Tee without cardioversion N/A 07/03/2014    Procedure: TRANSESOPHAGEAL ECHOCARDIOGRAM (TEE);  Surgeon: Jerline Pain, MD;  Location: [facility] ENDOSCOPY;  Service: Cardiovascular;  Laterality: N/A;  . Excision of atrial myxoma N/A 07/10/2014    Procedure: RESECTION OF  LEFT ATRIAL MASS;  Surgeon: Rexene Alberts, MD;  Location: [place];  Service: Open Heart Surgery;  Laterality: N/A;  . Tee without cardioversion N/A 07/10/2014    Procedure: TRANSESOPHAGEAL ECHOCARDIOGRAM (TEE);  Surgeon: Rexene Alberts, MD;  Location: [place];  Service: Open Heart Surgery;  Laterality: N/A;  . Repair of patent foramen ovale N/A 07/10/2014    Procedure: REPAIR OF PATENT FORAMEN OVALE;  Surgeon: Rexene Alberts, MD;  Location: [place];  Service: Open Heart Surgery;  Laterality: N/A;  . Coronary artery bypass graft N/A 07/10/2014    Procedure: CORONARY ARTERY BYPASS GRAFTING (CABG), ON PUMP, TIMES TWO, USING LEFT INTERNAL MAMMARY ARTERY, RIGHT GREATER SAPHENOUS VEIN HARVESTED ENDOSCOPICALLY;  Surgeon: Rexene Alberts, MD;  Location: [place];  Service: Open Heart Surgery;  Laterality: N/A;  . Colonoscopy    . Radiology with anesthesia N/A 12/23/2014    Procedure: Stent supported coil embolization OR right ACA aneurysm;  Surgeon: Consuella Lose, MD;  Location: [place] NEURO ORS;  Service: Radiology;  Laterality: N/A;    SH: Social History  Substance Use Topics  . Smoking status: Former Smoker    Quit date: 07/01/2014  . Smokeless tobacco: Never Used     Comment: Uses nicotiene losenges  . Alcohol Use: 0.0 oz/week  0 Glasses of wine per week     Comment: rare    MEDS: Prior to Admission medications   Medication Sig Start Date End Date Taking? Authorizing Provider  amLODipine (NORVASC) 5 MG tablet Take 1 tablet (5 mg total) by mouth daily. 07/06/15  Yes Fay Records, MD  aspirin 325 MG tablet Take 325 mg by mouth daily.   Yes Historical Provider, MD  atorvastatin (LIPITOR) 80 MG tablet Take 1 tablet (80 mg total) by mouth daily at 6 PM. 07/14/14  Yes Donielle Liston Alba, PA-C  calcium carbonate (OS-CAL) 600 MG TABS tablet Take 600 mg by mouth daily.   Yes Historical Provider, MD  Cholecalciferol (VITAMIN D-3) 1000 UNITS CAPS Take 1,000 Units by mouth daily.   Yes Historical Provider, MD   furosemide (LASIX) 40 MG tablet Take 1 tablet (40 mg total) by mouth daily. Patient taking differently: Take 40 mg by mouth daily as needed.  07/06/15  Yes Fay Records, MD  hydrochlorothiazide (MICROZIDE) 12.5 MG capsule Take 1 capsule (12.5 mg total) by mouth daily. 10/15/14  Yes Fay Records, MD  KRILL OIL PO Take 1 tablet by mouth daily.   Yes Historical Provider, MD  LUTEIN PO Take 1 tablet by mouth daily.   Yes Historical Provider, MD  Multiple Vitamins-Minerals (MULTIVITAL) tablet Take 1 tablet by mouth daily.   Yes Historical Provider, MD  nicotine polacrilex (NICOTINE MINI) 2 MG lozenge Take 1 mg by mouth as directed. Smoking cessation   Yes Historical Provider, MD  oxybutynin (DITROPAN-XL) 10 MG 24 hr tablet Take 10 mg by mouth at bedtime.   Yes Historical Provider, MD  polyethylene glycol (MIRALAX / GLYCOLAX) packet Take 17 g by mouth daily as needed for mild constipation.   Yes Historical Provider, MD    ALLERGY: Allergies  Allergen Reactions  . Sulfa Antibiotics     Childhood allergy - unknown    ROS: ROS  NEUROLOGIC EXAM: Awake, alert, oriented Memory and concentration grossly intact Speech fluent, appropriate CN grossly intact Motor exam: Upper Extremities Deltoid Bicep Tricep Grip  Right 5/5 5/5 5/5 5/5  Left 5/5 5/5 5/5 5/5   Lower Extremity IP Quad PF DF EHL  Right 5/5 5/5 5/5 5/5 5/5  Left 5/5 5/5 5/5 5/5 5/5   Sensation grossly intact to LT  IMPRESSION: - 65 y.o. female 41mo s/p stent-supported coiling of ACA aneurysm, doing well  PLAN: - Proceed with routine f/u diagnostic cerebral angiogram  I, Dr. Consuella Lose, attest that I have discussed with the patient, Hannah Nielsen, the benefits, risks, side effects, alternatives, likelihood of achieving goals and potential problems during recovery of diagnostic angiogram. After all questions were answered, informed consent was obtained.

## 2015-07-28 NOTE — Op Note (Signed)
DIAGNOSTIC CEREBRAL ANGIOGRAM    OPERATOR:   Dr. Consuella Lose, MD  HISTORY:   The patient is a 65 y.o. yo female with a history of ACA aneurysm who underwent stent-supported coiling about 6 months ago. She has done very well and presents today for routine f/u. She is off plavix, and currently is maintained on ASA.  APPROACH:   The technical aspects of the procedure as well as its potential risks and benefits were reviewed with the patient. These risks included but were not limited bleeding, infection, allergic reaction, damage to organs/vital structures, stroke, non-diagnostic procedure, and the catastrophic outcomes of heart attack, coma, and death. With an understanding of these risks, informed consent was obtained and witnessed.    The patient was placed in the supine position on the angiography table and the skin of right groin prepped in the usual sterile fashion. The procedure was performed under local anesthesia (1%-solution of bicarbonate-bufferred Lidoacaine) and conscious sedation with Versed and fentanyl monitored by the in-suite nurse.    A 5- French sheath was introduced in the right common femoral artery using Seldinger technique.  A fluorophase sequence was used to document the sheath position.    HEPARIN: 2000 Units total.   CONTRAST AGENT: 50cc, Omnipaque 300   FLUOROSCOPY TIME: 7.1 combined AP and lateral minutes    CATHETER(S) AND WIRE(S):    5-French JB-1 glidecatheter   0.035" glidewire    VESSELS CATHETERIZED:   Right common carotid Right internal carotid   Left internal carotid   Right vertebral   Left vertebral   Right common femoral  VESSELS STUDIED:   Right common carotid, neck Right internal carotid, head Right vertebral Left internal carotid, head Left vertebral Right femoral  PROCEDURAL NARRATIVE:   A 5-Fr JB-1 terumo glide catheter was advanced over a 0.035 glidewire into the aortic arch. The above vessels were then sequentially catheterized  and cervical/cerebral angiograms taken. After review of images, the catheter was removed without incident.    INTERPRETATION:   Right common carotid: neck:   There is mild atherosclerotic disease at the origin of the right internal, with stenosis <50% and no flow limitation.    Right internal carotid: head:   Injection reveals the presence of a widely patent ICA, M1, and A1 segments and their branches. Stent is seen extending from the right A1 into the A2 segment without any stenosis. Coil mass is seen in the region of the A1-A2 junction without any aneurysm filling identified.  The parenchymal and venous phases are normal. The venous sinuses are widely patent.    Left internal carotid: head:   Injection reveals the presence of a widely patent ICA, A1, and M1 segments and their branches. There is no significant stenosis, occlusion, aneurysm, or high flow vascular malformation visualized. The parenchymal and venous phases are normal. The venous sinuses are widely patent.    Left vertebral:   Injection reveals the presence of a widely patent vertebral artery. This leads to a widely patent basilar artery that terminates in bilateral P1. The basilar apex is normal. There is no significant stenosis, occlusion, aneurysm, or vascular malformation visualized. The parenchymal and venous phases are normal. The venous sinuses are widely patent.    Right vertebral:    Normal vessel. No PICA aneurysm. See basilar description above.    Right femoral:    Normal vessel. No significant atherosclerotic disease. Arterial sheath in adequate position.   DISPOSITION:  Upon completion of the study, the femoral sheath was removed  and hemostasis obtained by manual compression. Good proximal and distal lower extremity pulses were documented upon achievement of hemostasis.    The procedure was well tolerated and no early complications were observed.       The patient was transferred back to the holding area to be  positioned flat in bed for 5 hours of observation.    IMPRESSION:  1. Complete occlusion of a right A1-2 junction aneurysm 6 months after stent-supported coiling. No in-stent stenosis is identified.  The preliminary results of this procedure were shared with the patient and the patient's family.

## 2015-07-28 NOTE — Discharge Instructions (Signed)
Cerebral Angiogram, Care After °Refer to this sheet in the next few weeks. These instructions provide you with information on caring for yourself after your procedure. Your health care provider may also give you more specific instructions. Your treatment has been planned according to current medical practices, but problems sometimes occur. Call your health care provider if you have any problems or questions after your procedure. °WHAT TO EXPECT AFTER THE PROCEDURE °After your procedure, it is typical to have the following: °· Bruising at the catheter insertion site that usually fades within 1-2 weeks. °· Blood collecting in the tissue (hematoma) that may be painful to the touch. It should usually decrease in size and tenderness within 1-2 weeks. °· A mild headache. °HOME CARE INSTRUCTIONS °· Take medicines only as directed by your health care provider. °· You may shower 24-48 hours after the procedure or as directed by your health care provider. Remove the bandage (dressing) and gently wash the site with plain soap and water. Pat the area dry with a clean towel. Do not rub the site, because this may cause bleeding. °· Do not take baths, swim, or use a hot tub until your health care provider approves. °· Check your insertion site every day for redness, swelling, or drainage. °· Do not apply powder or lotion to the site. °· Do not lift over 10 lb (4.5 kg) for 5 days after your procedure or as directed by your health care provider. °· Ask your health care provider when it is okay to: °¨ Return to work or school. °¨ Resume usual physical activities or sports. °¨ Resume sexual activity. °· Do not drive home if you are discharged the same day as the procedure. Have someone else drive you. °· You may drive 24 hours after the procedure unless otherwise instructed by your health care provider. °· Do not operate machinery or power tools for 24 hours after the procedure or as directed by your health care provider. °· If your  procedure was done as an outpatient procedure, which means that you went home the same day as your procedure, a responsible adult should be with you for the first 24 hours after you arrive home. °· Keep all follow-up visits as directed by your health care provider. This is important. °SEEK MEDICAL CARE IF: °· You have a fever. °· You have chills. °· You have increased bleeding from the catheter insertion site. Hold pressure on the site. °SEEK IMMEDIATE MEDICAL CARE IF: °· You have vision changes or loss of vision. °· You have numbness or weakness on one side of your body. °· You have difficulty talking, or you have slurred speech or cannot speak (aphasia). °· You feel confused or have difficulty remembering. °· You have unusual pain at the catheter insertion site. °· You have redness, warmth, or swelling at the catheter insertion site. °· You have drainage (other than a small amount of blood on the dressing) from the catheter insertion site. °· The catheter insertion site is bleeding, and the bleeding does not stop after 30 minutes of holding steady pressure on the site. °These symptoms may represent a serious problem that is an emergency. Do not wait to see if the symptoms will go away. Get medical help right away. Call your local emergency services (911 in [place] Do not drive yourself to the hospital. °  °This information is not intended to replace advice given to you by your health care provider. Make sure you discuss any questions you have with your   health care provider. °  °Document Released: 08/12/2013 Document Revised: 01/14/2014 Document Reviewed: 08/12/2013 °Elsevier Interactive Patient Education ©2016 [employer] ° °

## 2015-08-17 ENCOUNTER — Telehealth: Payer: Self-pay | Admitting: Internal Medicine

## 2015-08-17 NOTE — Telephone Encounter (Signed)
Patient st she joined [employer] about 1.5 weeks ago. At her last class, she got really SOB halfway through her circuit and had to stop.  They tried to check her HR with a pulse oximeter and it did not work. The SOB resolved with rest. She had not other symptoms - no CP, palpitations Patient wants to know if it is OK to continue working out. Informed her that if she becomes SOB, to rest. If SOB does not resolve with rest to call the office. She understands she will be called if Dr. Harrington Challenger has further instruction.

## 2015-08-17 NOTE — Telephone Encounter (Signed)
Pt has a question about excerning. Please give her a call had some SOB when she did so

## 2015-08-17 NOTE — Telephone Encounter (Signed)
Agree with recommendations.  

## 2015-08-18 NOTE — Telephone Encounter (Signed)
I agree   I tried to call pt  NO machine for msg  No answer Would follow  I saw her not that long ago  Doing OK at time

## 2015-10-07 ENCOUNTER — Encounter: Payer: Self-pay | Admitting: Neurology

## 2015-10-07 ENCOUNTER — Ambulatory Visit (INDEPENDENT_AMBULATORY_CARE_PROVIDER_SITE_OTHER): Payer: Medicare Other | Admitting: Neurology

## 2015-10-07 VITALS — BP 132/78 | HR 78 | Resp 16 | Ht 62.5 in | Wt 200.0 lb

## 2015-10-07 DIAGNOSIS — I671 Cerebral aneurysm, nonruptured: Secondary | ICD-10-CM

## 2015-10-07 DIAGNOSIS — Z8673 Personal history of transient ischemic attack (TIA), and cerebral infarction without residual deficits: Secondary | ICD-10-CM | POA: Diagnosis not present

## 2015-10-07 DIAGNOSIS — E669 Obesity, unspecified: Secondary | ICD-10-CM | POA: Diagnosis not present

## 2015-10-07 DIAGNOSIS — Z9989 Dependence on other enabling machines and devices: Principal | ICD-10-CM

## 2015-10-07 DIAGNOSIS — G4733 Obstructive sleep apnea (adult) (pediatric): Secondary | ICD-10-CM

## 2015-10-07 DIAGNOSIS — G5622 Lesion of ulnar nerve, left upper limb: Secondary | ICD-10-CM | POA: Diagnosis not present

## 2015-10-07 NOTE — Progress Notes (Signed)
Subjective:    Nielsen ID: Hannah Nielsen is a 65 y.o. female.  HPI     Interim history:   Hannah Nielsen is a 65 year old right-handed woman with an underlying medical history of hypertension, ACA aneurysm (s/p coiling under Dr. [name] on 12/23/14), coronary artery disease, status post 2 vessel CABG on 07/10/2014, papillary fibroblastoma of Hannah heart, stroke in March 2016, hyperlipidemia, recent smoking cessation, and obesity, who presents for follow-up consultation of Hannah Nielsen obstructive sleep apnea, established on CPAP therapy. Hannah Nielsen is unaccompanied today. I last saw Hannah Nielsen on  04/16/2015, at which time we talked about Hannah Nielsen sleep test results from Hannah Nielsen baseline sleep study on 01/18/2015 as well as Hannah Nielsen CPAP titration study from 02/08/2015. She was doing well with CPAP therapy and was also very pleased with Hannah Nielsen aneurysm coiling results. She felt improved with CPAP. She was fully compliant with treatment. She had no residual stroke related symptoms from March 2016. She quit smoking at Hannah time of Hannah Nielsen stroke.   Today, 10/07/2015: I reviewed Hannah Nielsen CPAP compliance data from 09/06/2015 through 10/05/2015, which is a total of 30 days, during which time she used Hannah Nielsen machine every night with percent used days greater than 4 hours at 93%, indicating excellent compliance with an average usage of 6 hours and 2 minutes, residual AHI 0.7 per hour, leak low with Hannah 95th percentile at 11.6 L/m on a pressure of 9 cm with EPR of 2.  Today, 10/07/2015: She reports doing Overall quite well. Had recent blood work with Hannah Nielsen primary care physician, have reviewed Hannah Nielsen test results with Hannah Nielsen from 09/29/2015. CBC with differential and CMP were unremarkable, total cholesterol 118, triglycerides 109, LDL 58. She was told to reduce Hannah Nielsen Lipitor to 40 mg daily. She has had intermittent numbness in Hannah Nielsen left forearm, mostly affecting digit 4 and 5 and perhaps Hannah medial aspect of Hannah Nielsen forearm. Upon further asking, she is able to shake Hannah Nielsen arm  or change position and Hannah symptoms improved. She is worried about Hannah Nielsen symptoms a little bit because Hannah Nielsen stroke symptoms started with left arm numbness. Nevertheless, on Hannah positive side, she can shake Hannah Nielsen forearm and Hannah Nielsen symptoms improved. She has had no new weakness or permanent numbness or constant tingling. She has noted some incoordination with Hannah Nielsen left hand but this does not seem to be new. She tries to exercise regularly and drink more water and has lost a little bit of weight. Hannah Nielsen cardiac thoracic surgeon has released Hannah Nielsen because she has been doing well. Dr. [name] will see Hannah Nielsen in frequently. She falls asleep quite well but sometimes has trouble staying asleep. She is compliant with CPAP therapy.    Previously:   I first met Hannah Nielsen on 12/09/2014 at Hannah request of Ward Givens and Dr. Leonie Man, at which time Hannah Nielsen reported snoring, witnessed apneic breathing pauses and excessive daytime somnolence. I invited Hannah Nielsen back for sleep study. She had a baseline sleep study, followed by a CPAP titration study. I went over Hannah Nielsen test results with Hannah Nielsen in detail today. Hannah Nielsen baseline sleep study from 01/18/2015 showed a sleep efficiency of 57.4% with a latency to sleep of 24 minutes and wake after sleep onset of 139 minutes with mild to moderate sleep fragmentation noted. She had an elevated arousal index. She had moderate PLMS with an index of 33 per hour, resulting in 11.7 arousals per hour. She had an increased percentage of light stage sleep, absence of slow-wave sleep and a decreased percentage of REM sleep with  a prolonged REM latency. Average oxygen saturation was 93%, nadir was 87%. She had mild to moderate snoring. Total AHI was 15.5 per hour. Based on Hannah Nielsen sleep-related complaints and Hannah Nielsen sleep study results, I invited Hannah Nielsen back for a full night CPAP titration study. She had this on 02/08/2015. Sleep efficiency was 76.4% with a latency to sleep of 40 minutes and wake after sleep onset of 68.5 minutes with  mild to moderate sleep fragmentation noted. She had an arousal index of 10 per hour. She had an increased percentage of stage II sleep, absence of slow-wave sleep, and a decreased percentage of REM sleep at 14.2% with a prolonged REM latency of 130 minutes. She had no significant PLMS, EKG or EEG changes. Average oxygen saturation was 92%, nadir was 89%. CPAP was titrated from 5 cm to 11 cm. AHI was 3.7 per hour on Hannah pressure of 9 cm. She had evidence of increase in central apneas on pressure is being on 9 cm. Based on Hannah Nielsen test results are prescribed CPAP therapy for home use.  I reviewed Hannah Nielsen CPAP compliance data from 03/16/2015 through 04/14/2015 which is a total of 30 days during which time she used Hannah Nielsen machine every night with percent used days greater than 4 hours at 100%, indicating superb compliance with an average usage of 7 hours and 45 minutes, residual AHI 2.4 per hour, leak low with Hannah 95th percentile at 4.1 L/m on a pressure of 9 cm with EPR of 2.   12/09/2014: She reports snoring, witnessed apneas per daughter (recently shared a hotel room) and excessive daytime somnolence. As far as Hannah Nielsen stroke is concerned. She had woken up with a numbness and weakness of Hannah Nielsen left hand. Hannah Nielsen weakness has improved and she has residual numbness in Hannah Nielsen left hand, lateral aspect. She is using nicotine lozenges and has quit smoking on 07/01/2014 at Hannah time of Hannah Nielsen stroke diagnosis. She drinks alcohol very occasionally. She drinks coffee 2 cups a day and no sodas and typically no other caffeine-containing beverages. She tries to drink water. She has no known family history of obstructive sleep apnea but does have a family history of early or sudden death. Hannah Nielsen father snored heavily. She reports a bedtime of around 9 or 10 PM and a rise time around 4:30 or 5. She typically does not wake up rested and Hannah Nielsen Epworth sleepiness score is elevated at 15 out of 24 today, Hannah Nielsen fatigue score is 42 out of 63. She lives alone. She is  a retired [employer] and [employer]. She is divorced 2. She has 3 grown children from Hannah Nielsen first marriage. She has a family history of brain aneurysm in Hannah Nielsen father who died when she was 69 years old from a ruptured brain aneurysm and his mother, Hannah Nielsen paternal grandmother had a brain aneurysm as well. Dr. Dorris Carnes is Hannah Nielsen cardiologist, Dr. Roxy Manns is Hannah Nielsen CT surgeon. She denies frank restless leg symptoms and is not known to twitch Hannah Nielsen legs and Hannah Nielsen sleep. She does wake up frequently in Hannah middle of Hannah night. She has to use Hannah bathroom on average 2-3 times per night.  Hannah Nielsen Past Medical History Is Significant For: Past Medical History  Diagnosis Date  . 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 CAD (coronary artery disease), native coronary artery: Severe 2 vessel dz per cardiac cath 07/04/14 07/04/2014  . Left atrial mass 07/03/2014    benign papillary fibroelastoma  .  Stroke ([place]) 07/23/2438    acute embolic stroke to right MCA territory  . Hyperlipidemia 07/02/2014    takes Lipitor daily  . S/P resection of left atrial benign papillary fibroelastoma and CABG x 2 07/10/2014    LIMA to LAD, SVG to RCA, EVH via right thigh  . Papillary fibroelastoma of heart   . Constipation     takes Miralax daily as needed  . Nocturia     takes Ditropan nightly  . Hypertension     takes Amlodipine and HCTZ daily  . Shortness of breath dyspnea     with exertion   . Headache     rarely   . Tingling     left hand,left breast,and left hip  . GERD (gastroesophageal reflux disease)     rarely takes Tums  . History of colon polyps     benign  . Urinary frequency   . Urinary urgency   . Cataract     left immature  . Insomnia     Hannah Nielsen Past Surgical History Is Significant For: Past Surgical History  Procedure Laterality Date  . Abdominal hysterectomy    . Left heart catheterization with coronary angiogram N/A 07/04/2014    Procedure: LEFT HEART  CATHETERIZATION WITH CORONARY ANGIOGRAM;  Surgeon: Jettie Booze, MD;  Location: [facility] CATH LAB;  Service: Cardiovascular;  Laterality: N/A;  . Tee without cardioversion N/A 07/03/2014    Procedure: TRANSESOPHAGEAL ECHOCARDIOGRAM (TEE);  Surgeon: Jerline Pain, MD;  Location: [facility] ENDOSCOPY;  Service: Cardiovascular;  Laterality: N/A;  . Excision of atrial myxoma N/A 07/10/2014    Procedure: RESECTION OF LEFT ATRIAL MASS;  Surgeon: Rexene Alberts, MD;  Location: [facility];  Service: Open Heart Surgery;  Laterality: N/A;  . Tee without cardioversion N/A 07/10/2014    Procedure: TRANSESOPHAGEAL ECHOCARDIOGRAM (TEE);  Surgeon: Rexene Alberts, MD;  Location: [facility];  Service: Open Heart Surgery;  Laterality: N/A;  . Repair of patent foramen ovale N/A 07/10/2014    Procedure: REPAIR OF PATENT FORAMEN OVALE;  Surgeon: Rexene Alberts, MD;  Location: [facility];  Service: Open Heart Surgery;  Laterality: N/A;  . Coronary artery bypass graft N/A 07/10/2014    Procedure: CORONARY ARTERY BYPASS GRAFTING (CABG), ON PUMP, TIMES TWO, USING LEFT INTERNAL MAMMARY ARTERY, RIGHT GREATER SAPHENOUS VEIN HARVESTED ENDOSCOPICALLY;  Surgeon: Rexene Alberts, MD;  Location: [facility];  Service: Open Heart Surgery;  Laterality: N/A;  . Colonoscopy    . Radiology with anesthesia N/A 12/23/2014    Procedure: Stent supported coil embolization OR right ACA aneurysm;  Surgeon: Consuella Lose, MD;  Location: [facility] NEURO ORS;  Service: Radiology;  Laterality: N/A;    Hannah Nielsen Family History Is Significant For: Family History  Problem Relation Age of Onset  . Hypertension Mother   . Heart failure Mother     Deceased  . Heart attack Mother   . Hypertension Sister   . Hypertension Brother   . Cerebral aneurysm Father 40    Deceased  . Aneurysm Father   . Stroke Neg Hx   . Heart attack Daughter   . Breast cancer Maternal Grandmother   . Heart attack Maternal Grandfather     Hannah Nielsen Social History Is Significant For: Social History   Social  History  . Marital Status: Divorced    Spouse Name: N/A  . Number of Children: 3  . Years of Education: college   Occupational History  .      retired Pharmacist, hospital  Social History Main Topics  . Smoking status: Former Smoker    Quit date: 07/01/2014  . Smokeless tobacco: Never Used     Comment: Uses nicotiene losenges  . Alcohol Use: 0.0 oz/week    0 Glasses of wine per week     Comment: rare  . Drug Use: No  . Sexual Activity: Not Asked   Other Topics Concern  . None   Social History Narrative   Nielsen lives at home alone and she is divorced.   Retired Pharmacist, hospital.   Education college.   Right handed.   Caffeine coffee two cups.    Hannah Nielsen Allergies Are:  Allergies  Allergen Reactions  . Sulfa Antibiotics     Childhood allergy - unknown  :   Hannah Nielsen Current Medications Are:  Outpatient Encounter Prescriptions as of 10/07/2015  Medication Sig  . amLODipine (NORVASC) 5 MG tablet Take 1 tablet (5 mg total) by mouth daily.  Marland Kitchen aspirin 81 MG tablet Take 81 mg by mouth daily.  Marland Kitchen atorvastatin (LIPITOR) 40 MG tablet TK 1 T PO QD AT 6 PM  . calcium carbonate (OS-CAL) 600 MG TABS tablet Take 600 mg by mouth daily.  . Cholecalciferol (VITAMIN D-3) 1000 UNITS CAPS Take 1,000 Units by mouth daily.  . furosemide (LASIX) 40 MG tablet Take 1 tablet (40 mg total) by mouth daily. (Nielsen taking differently: Take 40 mg by mouth daily as needed. )  . hydrochlorothiazide (MICROZIDE) 12.5 MG capsule TAKE 1 CAPSULE(12.5 MG) BY MOUTH DAILY  . KRILL OIL PO Take 1 tablet by mouth daily.  . LUTEIN PO Take 1 tablet by mouth daily.  . Multiple Vitamins-Minerals (MULTIVITAL) tablet Take 1 tablet by mouth daily.  . nicotine polacrilex (NICOTINE MINI) 2 MG lozenge Take 1 mg by mouth as directed. Smoking cessation  . oxybutynin (DITROPAN-XL) 10 MG 24 hr tablet Take 10 mg by mouth at bedtime.  . polyethylene glycol (MIRALAX / GLYCOLAX) packet Take 17 g by mouth daily as needed for mild constipation.  .  [DISCONTINUED] aspirin 325 MG tablet Take 325 mg by mouth daily.  . [DISCONTINUED] atorvastatin (LIPITOR) 80 MG tablet Take 1 tablet (80 mg total) by mouth daily at 6 PM.   No facility-administered encounter medications on file as of 10/07/2015.  :  Review of Systems:  Out of a complete 14 point review of systems, all are reviewed and negative with Hannah exception of these symptoms as listed below:   Review of Systems  Neurological:       Nielsen states that she is doing ok with CPAP. Has trouble sleeping with it.  Nielsen reports that Hannah Nielsen L hand keeps going numb.     Objective:  Neurologic Exam  Physical Exam Physical Examination:   Filed Vitals:   10/07/15 1108  BP: 132/78  Pulse: 78  Resp: 16   General Examination: Hannah Nielsen is a very pleasant 65 y.o. female in no acute distress. She appears well-developed and well-nourished and well groomed. She is obese, has lost a few lb. She is in good spirits today.  HEENT: Normocephalic, atraumatic, pupils are equal, round and reactive to light and accommodation. Funduscopic exam is normal with sharp disc margins noted. Extraocular tracking is good without limitation to gaze excursion or nystagmus noted. Normal smooth pursuit is noted. Hearing is grossly intact. She has mild bilateral cataracts. Face is symmetric with normal facial animation and normal facial sensation. Speech is clear with no dysarthria noted. There is no hypophonia. There is no  lip, neck/head, jaw or voice tremor. Neck is supple with full range of passive and active motion. There are no carotid bruits on auscultation. Oropharynx exam reveals: mild mouth dryness, adequate dental hygiene and moderate airway crowding, due to thicker tongue, redundant soft palate, large uvula, and redundant. Tonsils are small. Mallampati is class III. Neck circumference is 15 inches. Tongue protrudes centrally and palate elevates symmetrically. Nasal inspection reveals no significant nasal mucosal  bogginess or redness and no septal deviation.   Chest: Clear to auscultation without wheezing, rhonchi or crackles noted.  Heart: S1+S2+0, regular and normal without murmurs, rubs or gallops noted.   Abdomen: Soft, non-tender and non-distended with normal bowel sounds appreciated on auscultation.  Extremities: There is trace pitting edema in Hannah distal lower extremities bilaterally. Pedal pulses are intact.  Skin: Warm and dry without trophic changes noted. There are no varicose veins.  Musculoskeletal: exam reveals no obvious joint deformities, tenderness or joint swelling or erythema.   Neurologically:  Mental status: Hannah Nielsen is awake, alert and oriented in all 4 spheres. Hannah Nielsen immediate and remote memory, attention, language skills and fund of knowledge are appropriate. There is no evidence of aphasia, agnosia, apraxia or anomia. Speech is clear with normal prosody and enunciation. Thought process is linear. Mood is normal and affect is normal.  Cranial nerves II - XII are as described above under HEENT exam. In addition: shoulder shrug is normal with equal shoulder height noted. Motor exam: Normal bulk, strength and tone is noted. There is no drift, tremor or rebound. Romberg is negative. Reflexes are 2+ throughout. Fine motor skills and coordination: intact with normal finger taps, normal hand movements, normal rapid alternating patting, normal foot taps and normal foot agility.  Cerebellar testing: No dysmetria or intention tremor on finger to nose testing. Heel to shin is unremarkable bilaterally. There is no truncal or gait ataxia.  Sensory exam: intact to light touch, pinprick, vibration, temperature sense in Hannah upper and lower extremities. Gait, station and balance: She stands easily. No veering to one side is noted. No leaning to one side is noted. Posture is age-appropriate and stance is narrow based. Gait shows normal stride length and normal pace. No problems turning are noted.  She turns en bloc. Tandem walk is good today.   Assessment and plan:   In summary, Hannah Nielsen is a very pleasant 65 year old female with an underlying medical history of hypertension, ACA aneurysm (s/p elective stent-supported coil embolization under Dr. [name] on 12/23/14), coronary artery disease, s/p 2 vessel CABG on 07/10/2014, papillary fibroblastoma of Hannah heart, stroke in March 2016, hyperlipidemia, smoking cessation in March 2016, and obesity, who presents for follow-up consultation of Hannah Nielsen obstructive sleep apnea, established on CPAP at 9 cm with great compliance and ongoing good results reported. She had a baseline sleep study and CPAP titration study in October 2016. She has moderate obstructive sleep apnea, well treated with CPAP at 9 cm with full compliance and she is commended for Hannah Nielsen ongoing great treatment adherence. She is advised  that we can monitor Hannah Nielsen symptoms with respect to intermittent tingling and numbness in Hannah Nielsen left forearm. Upon further asking, she does recall having had issues with Hannah Nielsen ulnar nerve in Hannah past. Hannah Nielsen symptoms are more in keeping with Timentin compression of Hannah ulnar nerve. I suggested we can consider referring Hannah Nielsen for an EMG and nerve conduction test if needed. Furthermore, we can always move up Hannah Nielsen appointment with either Megan and Dr. Leonie Man. She is advised  to keep [place] posted. From my end of things, Hannah Nielsen exam is stable and blood work recently looked very good. She is advised about secondary stroke prevention again today and congratulated on Hannah Nielsen lifestyle changes and weight loss endeavors. I would like to see Hannah Nielsen back in one year for sleep apnea checkup, sooner as needed. I answered all Hannah Nielsen questions today and she was in agreement.  I spent 25 minutes in total face-to-face time with Hannah Nielsen, more than 50% of which was spent in counseling and coordination of care, reviewing test results, reviewing medication and discussing or reviewing Hannah diagnosis of OSA, its  prognosis and treatment options.

## 2015-10-07 NOTE — Patient Instructions (Signed)
Let's monitor your symptoms for L hand numbness and tingling, I think this could be ulnar nerve compression. We can always bring you in for a sooner appointment with Dr. Leonie Man or Jinny Blossom.  From my end of things, you are doing well, labs look good, exam stable.  Please continue using your CPAP regularly. While your insurance requires that you use CPAP at least 4 hours each night on 70% of the nights, I recommend, that you not skip any nights and use it throughout the night if you can. Getting used to CPAP and staying with the treatment long term does take time and patience and discipline. Untreated obstructive sleep apnea when it is moderate to severe can have an adverse impact on cardiovascular health and raise her risk for heart disease, arrhythmias, hypertension, congestive heart failure, stroke and diabetes. Untreated obstructive sleep apnea causes sleep disruption, nonrestorative sleep, and sleep deprivation. This can have an impact on your day to day functioning and cause daytime sleepiness and impairment of cognitive function, memory loss, mood disturbance, and problems focussing. Using CPAP regularly can improve these symptoms.  Keep up the good work! I will see you back in 1 year for sleep apnea check up.   Continue exercising regularly and take your medications as directed. As discussed, secondary prevention is key after a stroke. This means: taking care of blood sugar values or diabetes management, good blood pressure (hypertension) control and optimizing cholesterol management, exercising daily or regularly within your own mobility limitations of course, and overall cardiovascular risk factor reduction, which includes screening for and treatment of obstructive sleep apnea (OSA) and weight management.

## 2015-10-28 ENCOUNTER — Other Ambulatory Visit: Payer: Self-pay | Admitting: Internal Medicine

## 2015-12-18 ENCOUNTER — Encounter: Payer: Self-pay | Admitting: Internal Medicine

## 2016-01-04 ENCOUNTER — Encounter: Payer: Self-pay | Admitting: Internal Medicine

## 2016-01-04 ENCOUNTER — Ambulatory Visit (INDEPENDENT_AMBULATORY_CARE_PROVIDER_SITE_OTHER): Payer: Medicare Other | Admitting: Internal Medicine

## 2016-01-04 VITALS — BP 138/86 | HR 89 | Ht 62.0 in | Wt 207.8 lb

## 2016-01-04 DIAGNOSIS — I2510 Atherosclerotic heart disease of native coronary artery without angina pectoris: Secondary | ICD-10-CM

## 2016-01-04 DIAGNOSIS — I10 Essential (primary) hypertension: Secondary | ICD-10-CM

## 2016-01-04 LAB — LIPID PANEL
CHOLESTEROL: 128 mg/dL (ref 125–200)
HDL: 51 mg/dL (ref >=46)
LDL CALC: 51 mg/dL (ref <130)
TRIGLYCERIDES: 129 mg/dL (ref <150)
Total CHOL/HDL Ratio: 2.5 Ratio (ref <=5.0)
VLDL: 26 mg/dL (ref <30)

## 2016-01-04 LAB — CBC
HEMATOCRIT: 44.1 % (ref 35.0–45.0)
Hemoglobin: 15.1 g/dL (ref 11.7–15.5)
MCH: 30.1 pg (ref 27.0–33.0)
MCHC: 34.2 g/dL (ref 32.0–36.0)
MCV: 87.8 fL (ref 80.0–100.0)
MPV: 9.6 fL (ref 7.5–12.5)
Platelets: 333 10*3/uL (ref 140–400)
RBC: 5.02 MIL/uL (ref 3.80–5.10)
RDW: 13.6 % (ref 11.0–15.0)
WBC: 10.2 10*3/uL (ref 3.8–10.8)

## 2016-01-04 LAB — BASIC METABOLIC PANEL
BUN: 16 mg/dL (ref 7–25)
CHLORIDE: 101 mmol/L (ref 98–110)
CO2: 29 mmol/L (ref 20–31)
CREATININE: 0.79 mg/dL (ref 0.50–0.99)
Calcium: 9.9 mg/dL (ref 8.6–10.4)
Glucose, Bld: 95 mg/dL (ref 65–99)
POTASSIUM: 4.3 mmol/L (ref 3.5–5.3)
Sodium: 140 mmol/L (ref 135–146)

## 2016-01-04 NOTE — Patient Instructions (Signed)
Your physician recommends that you continue on your current medications as directed. Please refer to the Current Medication list given to you today.  Your physician recommends that you return for lab work in: today (CBC, BMET, LIPID)  Your physician wants you to follow-up in: [place].  You will receive a reminder letter in the mail two months in advance. If you don't receive a letter, please call our office to schedule the follow-up appointment.

## 2016-01-04 NOTE — Progress Notes (Signed)
Cardiology Office Note   Date:  01/04/2016   ID:  Hannah Nielsen, DOB 03-29-1951, MRN [id number]  PCP:  [name], MD  Cardiologist:   Dorris Carnes, MD   F/U of CAD a   History of Present Illness: Hannah Nielsen is a 65 y.o. female with a history of HTN, tobacco abuse and CAD. She suffered a CVA Work up showed L atrial papillary fibroelastoma as well as a 4.75mm R ACA aneurysm. She underwent removal of fibroelastoma and CABG x 2 (LIMA TO LAD and SVG to RCA). In addition underwent PFO closure. This was in march 2016 I saw her in clinic in I saw the pt in march 2017    Outpatient Medications Prior to Visit  Medication Sig Dispense Refill  . amLODipine (NORVASC) 5 MG tablet Take 1 tablet (5 mg total) by mouth daily. 90 tablet 3  . aspirin 81 MG tablet Take 81 mg by mouth daily.    . calcium carbonate (OS-CAL) 600 MG TABS tablet Take 600 mg by mouth daily.    . Cholecalciferol (VITAMIN D-3) 1000 UNITS CAPS Take 1,000 Units by mouth daily.    . hydrochlorothiazide (MICROZIDE) 12.5 MG capsule TAKE 1 CAPSULE(12.5 MG) BY MOUTH DAILY 90 capsule 2  . KRILL OIL PO Take 1 tablet by mouth daily.    . LUTEIN PO Take 1 tablet by mouth daily.    . Multiple Vitamins-Minerals (MULTIVITAL) tablet Take 1 tablet by mouth daily.    Marland Kitchen oxybutynin (DITROPAN-XL) 10 MG 24 hr tablet Take 10 mg by mouth at bedtime.    . polyethylene glycol (MIRALAX / GLYCOLAX) packet Take 17 g by mouth daily as needed for mild constipation.    Marland Kitchen atorvastatin (LIPITOR) 40 MG tablet TK 1 T PO QD AT 6 PM  5  . furosemide (LASIX) 40 MG tablet Take 1 tablet (40 mg total) by mouth daily. (Patient not taking: Reported on 01/04/2016) 30 tablet 0  . nicotine polacrilex (NICOTINE MINI) 2 MG lozenge Take 1 mg by mouth as directed. Smoking cessation     No facility-administered medications prior to visit.      Allergies:   Sulfa antibiotics   Past Medical History:  Diagnosis Date  . Aneurysm of anterior cerebral  artery: Right per MRI 07/01/14 07/02/2014   4.4 mm  . CAD (coronary artery disease), native coronary artery: Severe 2 vessel dz per cardiac cath 07/04/14 07/04/2014  . Cataract    left immature  . Constipation    takes Miralax daily as needed  . GERD (gastroesophageal reflux disease)    rarely takes Tums  . Headache    rarely   . History of colon polyps    benign  . Hyperlipidemia 07/02/2014   takes Lipitor daily  . Hypertension    takes Amlodipine and HCTZ daily  . Insomnia   . Left atrial mass 07/03/2014   benign papillary fibroelastoma  . Nocturia    takes Ditropan nightly  . Papillary fibroelastoma of heart   . S/P resection of left atrial benign papillary fibroelastoma and CABG x 2 07/10/2014   LIMA to LAD, SVG to RCA, EVH via right thigh  . Shortness of breath dyspnea    with exertion   . Stroke ([place]) 0000000   acute embolic stroke to right MCA territory  . Tingling    left hand,left breast,and left hip  . Urinary frequency   . Urinary urgency     Past Surgical History:  Procedure Laterality Date  .  ABDOMINAL HYSTERECTOMY    . COLONOSCOPY    . CORONARY ARTERY BYPASS GRAFT N/A 07/10/2014   Procedure: CORONARY ARTERY BYPASS GRAFTING (CABG), ON PUMP, TIMES TWO, USING LEFT INTERNAL MAMMARY ARTERY, RIGHT GREATER SAPHENOUS VEIN HARVESTED ENDOSCOPICALLY;  Surgeon: Rexene Alberts, MD;  Location: [facility];  Service: Open Heart Surgery;  Laterality: N/A;  . EXCISION OF ATRIAL MYXOMA N/A 07/10/2014   Procedure: RESECTION OF LEFT ATRIAL MASS;  Surgeon: Rexene Alberts, MD;  Location: [facility];  Service: Open Heart Surgery;  Laterality: N/A;  . LEFT HEART CATHETERIZATION WITH CORONARY ANGIOGRAM N/A 07/04/2014   Procedure: LEFT HEART CATHETERIZATION WITH CORONARY ANGIOGRAM;  Surgeon: Jettie Booze, MD;  Location[facility];  Service: Cardiovascular;  Laterality: N/A;  . RADIOLOGY WITH ANESTHESIA N/A 12/23/2014   Procedure: Stent supported coil embolization OR right ACA aneurysm;   Surgeon: Consuella Lose, MD;  Location: [facility];  Service: Radiology;  Laterality: N/A;  . REPAIR OF PATENT FORAMEN OVALE N/A 07/10/2014   Procedure: REPAIR OF PATENT FORAMEN OVALE;  Surgeon: Rexene Alberts, MD;  Location: [facility];  Service: Open Heart Surgery;  Laterality: N/A;  . TEE WITHOUT CARDIOVERSION N/A 07/03/2014   Procedure: TRANSESOPHAGEAL ECHOCARDIOGRAM (TEE);  Surgeon: Jerline Pain, MD;  Location: [facility];  Service: Cardiovascular;  Laterality: N/A;  . TEE WITHOUT CARDIOVERSION N/A 07/10/2014   Procedure: TRANSESOPHAGEAL ECHOCARDIOGRAM (TEE);  Surgeon: Rexene Alberts, MD;  Location: [facility];  Service: Open Heart Surgery;  Laterality: N/A;     Social History:  The patient  reports that she quit smoking about 18 months ago. She has never used smokeless tobacco. She reports that she drinks alcohol. She reports that she does not use drugs.   Family History:  The patient's family history includes Aneurysm in her father; Breast cancer in her maternal grandmother; Cerebral aneurysm (age of onset: 29) in her father; Heart attack in her daughter, maternal grandfather, and mother; Heart failure in her mother; Hypertension in her brother, mother, and sister.    ROS:  Please see the history of present illness. All other systems are reviewed and  Negative to the above problem except as noted.    PHYSICAL EXAM: VS:  BP 138/86   Pulse 89   Ht 5\' 2"  (1.575 m)   Wt 207 lb 12.8 oz (94.3 kg)   BMI 38.01 kg/m   GEN: Well nourished, well developed, in no acute distress  HEENT: normal  Neck: no JVD, carotid bruits, or masses Cardiac: RRR; no murmurs, rubs, or gallops,no edema  Respiratory:  clear to auscultation bilaterally, normal work of breathing GI: soft, nontender, nondistended, + BS  No hepatomegaly  MS: no deformity Moving all extremities   Skin: warm and dry, no rash Neuro:  Strength and sensation are intact Psych: euthymic mood, full affect   EKG:  EKG is not  ordered  today.   Lipid Panel    Component Value Date/Time   CHOL 126 07/06/2015 0854   TRIG 122 07/06/2015 0854   HDL 42 (L) 07/06/2015 0854   CHOLHDL 3.0 07/06/2015 0854   VLDL 24 07/06/2015 0854   LDLCALC 60 07/06/2015 0854      Wt Readings from Last 3 Encounters:  01/04/16 207 lb 12.8 oz (94.3 kg)  10/07/15 200 lb (90.7 kg)  07/28/15 200 lb (90.7 kg)      ASSESSMENT AND PLAN:  1  CAD  S/p CABG for signif 2V CAD  Doing good  Check labs  2.  HTN  Fair control  Encouraged her to check it at home   3  HL  Lipitor was cut back  Check today  4  Obesity  Encouraged wt loss  Pt member of [employer]  Recomm silver sneakers     5  CVA  Due to fibroestoma  S/p surgery  F/U in 9 months    Chck CBC and BMET     Current medicines are reviewed at length with the patient today.  The patient does not have concerns regarding medicines.  Disposition:   FU with  in   Signed, Dorris Carnes, MD  01/04/2016 9:44 AM    [address], [place], [place]  [address] Phone: [phone]; Fax: [phone]

## 2016-03-25 ENCOUNTER — Encounter: Payer: Self-pay | Admitting: Podiatry

## 2016-03-25 ENCOUNTER — Ambulatory Visit (INDEPENDENT_AMBULATORY_CARE_PROVIDER_SITE_OTHER): Payer: Medicare Other | Admitting: Podiatry

## 2016-03-25 ENCOUNTER — Ambulatory Visit (INDEPENDENT_AMBULATORY_CARE_PROVIDER_SITE_OTHER): Payer: Medicare Other

## 2016-03-25 VITALS — BP 143/69 | HR 80 | Resp 16 | Ht 62.5 in | Wt 200.0 lb

## 2016-03-25 DIAGNOSIS — M79671 Pain in right foot: Secondary | ICD-10-CM

## 2016-03-25 DIAGNOSIS — L600 Ingrowing nail: Secondary | ICD-10-CM | POA: Diagnosis not present

## 2016-03-25 DIAGNOSIS — M722 Plantar fascial fibromatosis: Secondary | ICD-10-CM

## 2016-03-25 MED ORDER — TRIAMCINOLONE ACETONIDE 10 MG/ML IJ SUSP: 10.0000 mg | Freq: Once | INTRAMUSCULAR | Status: DC

## 2016-03-25 MED ORDER — TRIAMCINOLONE ACETONIDE 10 MG/ML IJ SUSP
10.0000 mg | Freq: Once | INTRAMUSCULAR | Status: AC
  Administered 2016-03-25: 10 mg

## 2016-03-25 NOTE — Patient Instructions (Signed)
ANTIBACTERIAL SOAP INSTRUCTIONS  THE DAY AFTER PROCEDURE  Please follow the instructions your doctor has marked.   Shower as usual. Before getting out, place a drop of antibacterial liquid soap (Dial) on a wet, clean washcloth.  Gently wipe washcloth over affected area.  Afterward, rinse the area with warm water.  Blot the area dry with a soft cloth and cover with antibiotic ointment (neosporin, polysporin, bacitracin) and band aid or gauze and tape  Place 3-4 drops of antibacterial liquid soap in a quart of warm tap water.  Submerge foot into water for 20 minutes.  If bandage was applied after your procedure, leave on to allow for easy lift off, then remove and continue with soak for the remaining time.  Next, blot area dry with a soft cloth and cover with a bandage.  Apply other medications as directed by your doctor, such as cortisporin otic solution (eardrops) or neosporin antibiotic ointment  Soak toe/toes for approximately 1-2 weeks, allow affected toe to air dry periodically throughout the day when not wearing shoes. When wearing a shoe, apply neosporin as needed with a light bandage to protect the nail bed.  Leave original bandage ( this is the big blue bandage you leave the office wearing) in place for 24 hours if tolerable. Begin first soak after the 24 hour period. If severe pain or throbbing occurs you can begin your first soak before the 24 hour period. Submerge toe with bandages on into soapy water to allow bandages to loosen, then remove bandages. Allow area to air dry before dressing with a bandage.  Drainage for approximately 2-4 weeks is normal,  Discomfort to area is to be expected. Tylenol or ibuprofen can be taken as directed if tolerated.  Any severe pain, blistering, increased swelling or redness needs to be evaluated, please call the office with any questions or concerns    Plantar Fasciitis (Heel Spur Syndrome) with Rehab The plantar fascia is a fibrous, ligament-like,  soft-tissue structure that spans the bottom of the foot. Plantar fasciitis is a condition that causes pain in the foot due to inflammation of the tissue. SYMPTOMS   Pain and tenderness on the underneath side of the foot.  Pain that worsens with standing or walking. CAUSES  Plantar fasciitis is caused by irritation and injury to the plantar fascia on the underneath side of the foot. Common mechanisms of injury include:  Direct trauma to bottom of the foot.  Damage to a small nerve that runs under the foot where the main fascia attaches to the heel bone.  Stress placed on the plantar fascia due to bone spurs. RISK INCREASES WITH:   Activities that place stress on the plantar fascia (running, jumping, pivoting, or cutting).  Poor strength and flexibility.  Improperly fitted shoes.  Tight calf muscles.  Flat feet.  Failure to warm-up properly before activity.  Obesity. PREVENTION  Warm up and stretch properly before activity.  Allow for adequate recovery between workouts.  Maintain physical fitness:  Strength, flexibility, and endurance.  Cardiovascular fitness.  Maintain a health body weight.  Avoid stress on the plantar fascia.  Wear properly fitted shoes, including arch supports for individuals who have flat feet.  PROGNOSIS  If treated properly, then the symptoms of plantar fasciitis usually resolve without surgery. However, occasionally surgery is necessary.  RELATED COMPLICATIONS   Recurrent symptoms that may result in a chronic condition.  Problems of the lower back that are caused by compensating for the injury, such as limping.  Pain or weakness of the foot during push-off following surgery.  Chronic inflammation, scarring, and partial or complete fascia tear, occurring more often from repeated injections.  TREATMENT  Treatment initially involves the use of ice and medication to help reduce pain and inflammation. The use of strengthening and  stretching exercises may help reduce pain with activity, especially stretches of the Achilles tendon. These exercises may be performed at home or with a therapist. Your caregiver may recommend that you use heel cups of arch supports to help reduce stress on the plantar fascia. Occasionally, corticosteroid injections are given to reduce inflammation. If symptoms persist for greater than 6 months despite non-surgical (conservative), then surgery may be recommended.   MEDICATION   If pain medication is necessary, then nonsteroidal anti-inflammatory medications, such as aspirin and ibuprofen, or other minor pain relievers, such as acetaminophen, are often recommended.  Do not take pain medication within 7 days before surgery.  Prescription pain relievers may be given if deemed necessary by your caregiver. Use only as directed and only as much as you need.  Corticosteroid injections may be given by your caregiver. These injections should be reserved for the most serious cases, because they may only be given a certain number of times.  HEAT AND COLD  Cold treatment (icing) relieves pain and reduces inflammation. Cold treatment should be applied for 10 to 15 minutes every 2 to 3 hours for inflammation and pain and immediately after any activity that aggravates your symptoms. Use ice packs or massage the area with a piece of ice (ice massage).  Heat treatment may be used prior to performing the stretching and strengthening activities prescribed by your caregiver, physical therapist, or athletic trainer. Use a heat pack or soak the injury in warm water.  SEEK IMMEDIATE MEDICAL CARE IF:  Treatment seems to offer no benefit, or the condition worsens.  Any medications produce adverse side effects.  EXERCISES- RANGE OF MOTION (ROM) AND STRETCHING EXERCISES - Plantar Fasciitis (Heel Spur Syndrome) These exercises may help you when beginning to rehabilitate your injury. Your symptoms may resolve with or  without further involvement from your physician, physical therapist or athletic trainer. While completing these exercises, remember:   Restoring tissue flexibility helps normal motion to return to the joints. This allows healthier, less painful movement and activity.  An effective stretch should be held for at least 30 seconds.  A stretch should never be painful. You should only feel a gentle lengthening or release in the stretched tissue.  RANGE OF MOTION - Toe Extension, Flexion  Sit with your right / left leg crossed over your opposite knee.  Grasp your toes and gently pull them back toward the top of your foot. You should feel a stretch on the bottom of your toes and/or foot.  Hold this stretch for 10 seconds.  Now, gently pull your toes toward the bottom of your foot. You should feel a stretch on the top of your toes and or foot.  Hold this stretch for 10 seconds. Repeat  times. Complete this stretch 3 times per day.   RANGE OF MOTION - Ankle Dorsiflexion, Active Assisted  Remove shoes and sit on a chair that is preferably not on a carpeted surface.  Place right / left foot under knee. Extend your opposite leg for support.  Keeping your heel down, slide your right / left foot back toward the chair until you feel a stretch at your ankle or calf. If you do not feel a stretch,  slide your bottom forward to the edge of the chair, while still keeping your heel down.  Hold this stretch for 10 seconds. Repeat 3 times. Complete this stretch 2 times per day.   STRETCH  Gastroc, Standing  Place hands on wall.  Extend right / left leg, keeping the front knee somewhat bent.  Slightly point your toes inward on your back foot.  Keeping your right / left heel on the floor and your knee straight, shift your weight toward the wall, not allowing your back to arch.  You should feel a gentle stretch in the right / left calf. Hold this position for 10 seconds. Repeat 3 times. Complete this  stretch 2 times per day.  STRETCH  Soleus, Standing  Place hands on wall.  Extend right / left leg, keeping the other knee somewhat bent.  Slightly point your toes inward on your back foot.  Keep your right / left heel on the floor, bend your back knee, and slightly shift your weight over the back leg so that you feel a gentle stretch deep in your back calf.  Hold this position for 10 seconds. Repeat 3 times. Complete this stretch 2 times per day.  STRETCH  Gastrocsoleus, Standing  Note: This exercise can place a lot of stress on your foot and ankle. Please complete this exercise only if specifically instructed by your caregiver.   Place the ball of your right / left foot on a step, keeping your other foot firmly on the same step.  Hold on to the wall or a rail for balance.  Slowly lift your other foot, allowing your body weight to press your heel down over the edge of the step.  You should feel a stretch in your right / left calf.  Hold this position for 10 seconds.  Repeat this exercise with a slight bend in your right / left knee. Repeat 3 times. Complete this stretch 2 times per day.   STRENGTHENING EXERCISES - Plantar Fasciitis (Heel Spur Syndrome)  These exercises may help you when beginning to rehabilitate your injury. They may resolve your symptoms with or without further involvement from your physician, physical therapist or athletic trainer. While completing these exercises, remember:   Muscles can gain both the endurance and the strength needed for everyday activities through controlled exercises.  Complete these exercises as instructed by your physician, physical therapist or athletic trainer. Progress the resistance and repetitions only as guided.  STRENGTH - Towel Curls  Sit in a chair positioned on a non-carpeted surface.  Place your foot on a towel, keeping your heel on the floor.  Pull the towel toward your heel by only curling your toes. Keep your heel on  the floor. Repeat 3 times. Complete this exercise 2 times per day.  STRENGTH - Ankle Inversion  Secure one end of a rubber exercise band/tubing to a fixed object (table, pole). Loop the other end around your foot just before your toes.  Place your fists between your knees. This will focus your strengthening at your ankle.  Slowly, pull your big toe up and in, making sure the band/tubing is positioned to resist the entire motion.  Hold this position for 10 seconds.  Have your muscles resist the band/tubing as it slowly pulls your foot back to the starting position. Repeat 3 times. Complete this exercises 2 times per day.  Document Released: 03/28/2005 Document Revised: 06/20/2011 Document Reviewed: 07/10/2008 [facility] Patient Information 2014 [place], [place].

## 2016-03-25 NOTE — Progress Notes (Signed)
   Subjective:    Patient ID: Hannah Nielsen, female    DOB: June 06, 1950, 65 y.o.   MRN: [id number]  HPI Chief Complaint  Patient presents with  . Foot Pain    Right foot; heel; x1 month  . Nail Problem    Left foot; great toe-lateral side      Review of Systems  Cardiovascular: Positive for leg swelling.  All other systems reviewed and are negative.      Objective:   Physical Exam        Assessment & Plan:

## 2016-03-25 NOTE — Progress Notes (Signed)
Subjective:     Patient ID: Hannah Nielsen, female   DOB: Oct 05, 1950, 65 y.o.   MRN: [id number]  HPI patient states that she's having a lot of pain in the right heel of 4 months duration and chronic ingrown toenail deformity left big toe that's makes it hard for her to wear shoe gear comfortably   Review of Systems     Objective:   Physical Exam Neurovascular status intact muscle strength adequate and patient found to have exquisite discomfort plantar aspect right heel at the insertional point of the tendon into the calcaneus and on the left foot is noted to have incurvated lateral hallux nail that's painful when pressed and makes shoe gear difficult. Patient's found have good digital perfusion and is well oriented 3    Assessment:     Acute plantar fasciitis right with ingrown toenail deformity left hallux lateral border with pain    Plan:     H&P condition reviewed and recommended correction of deformities. For the right I did go ahead and injected the plantar fascia 3 mg Kenalog 5 mg Xylocaine and applied fascial brace and gave instructions on physical therapy. For the left I went ahead and I recommended ingrown toenail correction explaining risk and infiltrated 60 mg Xylocaine Marcaine mixture removing the corner exposing matrix and applying phenol 3 applications 30 seconds. Gave instructions on soaks and reappoint 2 weeks

## 2016-04-08 ENCOUNTER — Encounter: Payer: Self-pay | Admitting: Podiatry

## 2016-04-08 ENCOUNTER — Ambulatory Visit (INDEPENDENT_AMBULATORY_CARE_PROVIDER_SITE_OTHER): Payer: Medicare Other | Admitting: Podiatry

## 2016-04-08 DIAGNOSIS — M722 Plantar fascial fibromatosis: Secondary | ICD-10-CM | POA: Diagnosis not present

## 2016-04-08 MED ORDER — TRIAMCINOLONE ACETONIDE 10 MG/ML IJ SUSP
10.0000 mg | Freq: Once | INTRAMUSCULAR | Status: AC
  Administered 2016-04-08: 10 mg

## 2016-04-08 NOTE — Progress Notes (Signed)
Subjective:     Patient ID: Hannah Nielsen, female   DOB: 11-13-1950, 65 y.o.   MRN: [id number]  HPI patient states that the right heel is still bothering her specially when she gets up in the morning after periods of sitting and the left nails doing very well   Review of Systems     Objective:   Physical Exam Neurovascular status intact muscle strength adequate with discomfort in the right heel at the insertional point tendon into the calcaneus with moderate depression of the arch and the left ingrown is doing well and is crusted over    Assessment:     Plantar fasciitis still noted right with healed ingrown toenail left    Plan:     H&P conditions reviewed and were to focus on the right heel. I did go ahead and I reinjected 3 mg Kenalog 5 mill grams Xylocaine and I went ahead today and I Spence a night splint with all instructions on usage along with ice and Ace wrap for ice therapy

## 2016-04-14 ENCOUNTER — Encounter: Payer: Self-pay | Admitting: Adult Health

## 2016-04-14 ENCOUNTER — Ambulatory Visit (INDEPENDENT_AMBULATORY_CARE_PROVIDER_SITE_OTHER): Payer: Medicare Other | Admitting: Adult Health

## 2016-04-14 VITALS — BP 135/79 | HR 73 | Resp 20 | Ht 62.5 in | Wt 207.0 lb

## 2016-04-14 DIAGNOSIS — R200 Anesthesia of skin: Secondary | ICD-10-CM

## 2016-04-14 DIAGNOSIS — Z9989 Dependence on other enabling machines and devices: Secondary | ICD-10-CM | POA: Diagnosis not present

## 2016-04-14 DIAGNOSIS — R202 Paresthesia of skin: Secondary | ICD-10-CM | POA: Diagnosis not present

## 2016-04-14 DIAGNOSIS — Z8673 Personal history of transient ischemic attack (TIA), and cerebral infarction without residual deficits: Secondary | ICD-10-CM

## 2016-04-14 DIAGNOSIS — G4733 Obstructive sleep apnea (adult) (pediatric): Secondary | ICD-10-CM | POA: Diagnosis not present

## 2016-04-14 NOTE — Progress Notes (Addendum)
PATIENT: Hannah Nielsen DOB: 1950/11/22  REASON FOR VISIT: follow up- stroke HISTORY FROM: patient  HISTORY OF PRESENT ILLNESS: Hannah Nielsen is a 66 year old female with a history of stroke. She returns today for follow-up. She remains on aspirin for stroke prevention. Reports that her blood pressure has been in normal range. She has regular follow-ups with her primary care. Reports that her cholesterol has been in normal range. She is on Lipitor. She is not diabetic. The patient reports she has stopped smoking. She did have a sleep study and was diagnosed with moderate sleep apnea. She has been using the CPAP with good compliance in the past however she reports that in the last month she is become "lazy." And states that she occasionally takes it off during the night. Her CPAP download indicates that she didn't use her machine 26 out of 30 days but only uses her machine greater than 4 hours 9 of those days. Patient also reports that she has numbness and tingling in the left hand in the last 2 digits. In the past she has been told this is due to ulnar nerve compression. She states that it only happens occasionally. Is not accompanied with any other symptoms. She returns today for an evaluation.  HISTORY 10/07/14: Hannah Nielsen is a 66 year old female with a history of CVA on 07/01/2014. The patient presented to the ED with left hand numbness. She went to bed that night and woke up around 2 AM and felt that her left hand was numb and clumsy. She drove herself to the emergency room. The MRI indicated that she had a precentral cortex infarct. The patient was out of the window for TPA. A TEE was completed and an intracardiac mass as well as a probable PFO was discovered. Later in her hospital visit she had surgery to remove the mass and close the PFO. Patient also had carotid Dopplers that indicated bilateral ECAs with increased velocity consistent with severe stenosis. Her hemoglobin A1c was 6.3. Patient's LDL was  112. Also while in the hospital they found a right ACA Aneurysm 4.4 mm. The patient has a family history of ruptured aneurysms and therefore was referred to Dr. Ralene Ok for follow-up. The patient was discharged on Lipitor for her cholesterol. She was discharged with aspirin 325 mg 4 stroke prevention. The patient returns today for evaluation. She continues on aspirin for secondary stroke prevention. She is also on Lipitor for her cholesterol. Her latest cholesterol numbers were excellent. Patient states that her blood pressure has been controlled. She is currently taking metoprolol and Norvasc. The patient never received a referral to Dr.Nunkumar in regards to her aneurysm. She states that this is the most concerning thing for her. She states that her father died of a ruptured aneurysm and of course she wants to try to prevent that from happening to her. She states that since this stroke most of her symptoms have resolved. She does have a patch of numbness on the left side of the chest and the left thigh. She states that the numbness in her hands have improved but she still has a tingling feeling in the ring and pinky finger. Patient also reports that she does snore at night. She does not feel that she sleeps well during the night. She gets up 3-4 times to urinate. She does report daytime sleepiness. Her Epworth sleepiness scale is 15 and fatigue severity scale is 44. She states that her sister has been diagnosed with sleep apnea. She is concerned  that this may be the same for her. She denies any additional stroke like symptoms. She no longer smokes cigarettes. She is currently participating  in cardiac rehabilitation. She returns today for an evaluation.  REVIEW OF SYSTEMS: Out of a complete 14 system review of symptoms, the patient complains only of the following symptoms, and all other reviewed systems are negative.  Constipation, apnea, frequent waking, daytime sleepiness, snoring, leg  swelling  ALLERGIES: Allergies  Allergen Reactions  . Sulfa Antibiotics     Childhood allergy - unknown    HOME MEDICATIONS: Outpatient Medications Prior to Visit  Medication Sig Dispense Refill  . amLODipine (NORVASC) 5 MG tablet Take 1 tablet (5 mg total) by mouth daily. 90 tablet 3  . aspirin 81 MG tablet Take 81 mg by mouth daily.    Marland Kitchen atorvastatin (LIPITOR) 40 MG tablet Take 40 mg by mouth daily at 6 PM.    . calcium carbonate (OS-CAL) 600 MG TABS tablet Take 600 mg by mouth daily.    . Cholecalciferol (VITAMIN D-3) 1000 UNITS CAPS Take 1,000 Units by mouth daily.    . furosemide (LASIX) 40 MG tablet Take half (1/2) to one (1) tablet (20 mg to 40 mg total) by mouth daily as needed for fluid.    . hydrochlorothiazide (MICROZIDE) 12.5 MG capsule TAKE 1 CAPSULE(12.5 MG) BY MOUTH DAILY 90 capsule 2  . KRILL OIL PO Take 1 tablet by mouth daily.    . LUTEIN PO Take 1 tablet by mouth daily.    . Multiple Vitamins-Minerals (MULTIVITAL) tablet Take 1 tablet by mouth daily.    . nicotine polacrilex (COMMIT) 2 MG lozenge Take 2 mg by mouth as needed for smoking cessation.    Marland Kitchen oxybutynin (DITROPAN-XL) 10 MG 24 hr tablet Take 10 mg by mouth at bedtime.    . polyethylene glycol (MIRALAX / GLYCOLAX) packet Take 17 g by mouth daily as needed for mild constipation.     Facility-Administered Medications Prior to Visit  Medication Dose Route Frequency Provider Last Rate Last Dose  . triamcinolone acetonide (KENALOG) 10 MG/ML injection 10 mg  10 mg Other Once Wallene Huh, DPM        PAST MEDICAL HISTORY: Past Medical History:  Diagnosis Date  . Aneurysm of anterior cerebral artery: Right per MRI 07/01/14 07/02/2014   4.4 mm  . CAD (coronary artery disease), native coronary artery: Severe 2 vessel dz per cardiac cath 07/04/14 07/04/2014  . Cataract    left immature  . Constipation    takes Miralax daily as needed  . GERD (gastroesophageal reflux disease)    rarely takes Tums  . Headache     rarely   . History of colon polyps    benign  . Hyperlipidemia 07/02/2014   takes Lipitor daily  . Hypertension    takes Amlodipine and HCTZ daily  . Insomnia   . Left atrial mass 07/03/2014   benign papillary fibroelastoma  . Nocturia    takes Ditropan nightly  . Papillary fibroelastoma of heart   . S/P resection of left atrial benign papillary fibroelastoma and CABG x 2 07/10/2014   LIMA to LAD, SVG to RCA, EVH via right thigh  . Shortness of breath dyspnea    with exertion   . Stroke ([place]) 0000000   acute embolic stroke to right MCA territory  . Tingling    left hand,left breast,and left hip  . Urinary frequency   . Urinary urgency     PAST SURGICAL  HISTORY: Past Surgical History:  Procedure Laterality Date  . ABDOMINAL HYSTERECTOMY    . COLONOSCOPY    . CORONARY ARTERY BYPASS GRAFT N/A 07/10/2014   Procedure: CORONARY ARTERY BYPASS GRAFTING (CABG), ON PUMP, TIMES TWO, USING LEFT INTERNAL MAMMARY ARTERY, RIGHT GREATER SAPHENOUS VEIN HARVESTED ENDOSCOPICALLY;  Surgeon: Rexene Alberts, MD;  Location: [place];  Service: Open Heart Surgery;  Laterality: N/A;  . EXCISION OF ATRIAL MYXOMA N/A 07/10/2014   Procedure: RESECTION OF LEFT ATRIAL MASS;  Surgeon: Rexene Alberts, MD;  Location: [place];  Service: Open Heart Surgery;  Laterality: N/A;  . LEFT HEART CATHETERIZATION WITH CORONARY ANGIOGRAM N/A 07/04/2014   Procedure: LEFT HEART CATHETERIZATION WITH CORONARY ANGIOGRAM;  Surgeon: Jettie Booze, MD;  Location: [facility] CATH LAB;  Service: Cardiovascular;  Laterality: N/A;  . RADIOLOGY WITH ANESTHESIA N/A 12/23/2014   Procedure: Stent supported coil embolization OR right ACA aneurysm;  Surgeon: Consuella Lose, MD;  Location: [place] NEURO ORS;  Service: Radiology;  Laterality: N/A;  . REPAIR OF PATENT FORAMEN OVALE N/A 07/10/2014   Procedure: REPAIR OF PATENT FORAMEN OVALE;  Surgeon: Rexene Alberts, MD;  Location: [place];  Service: Open Heart Surgery;  Laterality: N/A;  . TEE WITHOUT  CARDIOVERSION N/A 07/03/2014   Procedure: TRANSESOPHAGEAL ECHOCARDIOGRAM (TEE);  Surgeon: Jerline Pain, MD;  Location: [place];  Service: Cardiovascular;  Laterality: N/A;  . TEE WITHOUT CARDIOVERSION N/A 07/10/2014   Procedure: TRANSESOPHAGEAL ECHOCARDIOGRAM (TEE);  Surgeon: Rexene Alberts, MD;  Location: [place];  Service: Open Heart Surgery;  Laterality: N/A;    FAMILY HISTORY: Family History  Problem Relation Age of Onset  . Hypertension Mother   . Heart failure Mother     Deceased  . Heart attack Mother   . Cerebral aneurysm Father 3    Deceased  . Aneurysm Father   . Breast cancer Maternal Grandmother   . Heart attack Maternal Grandfather   . Hypertension Sister   . Hypertension Brother   . Heart attack Daughter   . Stroke Neg Hx     SOCIAL HISTORY: Social History   Social History  . Marital status: Divorced    Spouse name: N/A  . Number of children: 3  . Years of education: college   Occupational History  .      retired Pharmacist, hospital   Social History Main Topics  . Smoking status: Former Smoker    Quit date: 07/01/2014  . Smokeless tobacco: Never Used     Comment: Uses nicotiene losenges  . Alcohol use 0.0 oz/week     Comment: rare  . Drug use: No  . Sexual activity: Not on file   Other Topics Concern  . Not on file   Social History Narrative   Patient lives at home alone and she is divorced.   Retired Pharmacist, hospital.   Education college.   Right handed.   Caffeine coffee two cups.      PHYSICAL EXAM  Vitals:   04/14/16 0805  BP: 135/79  Pulse: 73  Resp: 20  Weight: 207 lb (93.9 kg)  Height: 5' 2.5" (1.588 m)   Body mass index is 37.26 kg/m.  Generalized: Well developed, in no acute distress   Neurological examination  Mentation: Alert oriented to time, place, history taking. Follows all commands speech and language fluent Cranial nerve II-XII: Pupils were equal round reactive to light. Extraocular movements were full, visual field were full  on confrontational test. Facial sensation and strength were normal. Uvula tongue  midline. Head turning and shoulder shrug  were normal and symmetric. Motor: The motor testing reveals 5 over 5 strength of all 4 extremities. Good symmetric motor tone is noted throughout.  Sensory: Sensory testing is intact to soft touch on all 4 extremities. No evidence of extinction is noted.  Coordination: Cerebellar testing reveals good finger-nose-finger and heel-to-shin bilaterally.  Gait and station: Gait is normal. Tandem gait is normal. Romberg is negative. No drift is seen.  Reflexes: Deep tendon reflexes are symmetric and normal bilaterally.   DIAGNOSTIC DATA (LABS, IMAGING, TESTING) - I reviewed patient records, labs, notes, testing and imaging myself where available.  Lab Results  Component Value Date   WBC 10.2 01/04/2016   HGB 15.1 01/04/2016   HCT 44.1 01/04/2016   MCV 87.8 01/04/2016   PLT 333 01/04/2016      Component Value Date/Time   NA 140 01/04/2016 1004   K 4.3 01/04/2016 1004   CL 101 01/04/2016 1004   CO2 29 01/04/2016 1004   GLUCOSE 95 01/04/2016 1004   BUN 16 01/04/2016 1004   CREATININE 0.79 01/04/2016 1004   CALCIUM 9.9 01/04/2016 1004   PROT 7.0 09/22/2014 1035   ALBUMIN 3.9 09/22/2014 1035   AST 24 09/22/2014 1035   ALT 37 (H) 09/22/2014 1035   ALKPHOS 118 (H) 09/22/2014 1035   BILITOT 0.4 09/22/2014 1035   GFRNONAA >60 07/28/2015 0620   GFRAA >60 07/28/2015 0620   Lab Results  Component Value Date   CHOL 128 01/04/2016   HDL 51 01/04/2016   LDLCALC 51 01/04/2016   TRIG 129 01/04/2016   CHOLHDL 2.5 01/04/2016   Lab Results  Component Value Date   HGBA1C 6.1 (H) 07/08/2014     ASSESSMENT AND PLAN 66 y.o. year old female  has a past medical history of Aneurysm of anterior cerebral artery: Right per MRI 07/01/14 (07/02/2014); CAD (coronary artery disease), native coronary artery: Severe 2 vessel dz per cardiac cath 07/04/14 (07/04/2014); Cataract; Constipation;  GERD (gastroesophageal reflux disease); Headache; History of colon polyps; Hyperlipidemia (07/02/2014); Hypertension; Insomnia; Left atrial mass (07/03/2014); Nocturia; Papillary fibroelastoma of heart; S/P resection of left atrial benign papillary fibroelastoma and CABG x 2 (07/10/2014); Shortness of breath dyspnea; Stroke ([place]) (07/01/2014); Tingling; Urinary frequency; and Urinary urgency. here with:  1. History of stroke 2. Obstructive sleep apnea on CPAP 3. Numbness and tingling left hand  Patient will continue on aspirin for stroke prevention. She should maintain strict control of her blood pressure goal less than 130/90. Cholesterol LDL less than 70 and hemoglobin A1c less than 6.5%. I did have a discussion with the patient in regards to risk associated with untreated sleep apnea. She verbalized that she is noted due to better about using the CPAP nightly. Patient does report numbness and tingling in the last 2 digits on the left hand. She reports in the past she has been told this is due to her compressing the ulnar nerve. I advised patient that we can do a nerve conduction study however the patient deferred at this time. Patient advised that if she has any strokelike symptoms she should let us know. She will follow-up in one year with Dr. Leonie Man.     Ward Givens, MSN, NP-C 04/14/2016, 8:11 AM [facility] [address], [place], [place] [address] [phone]  I reviewed the above note and documentation by the Nurse Practitioner and agree with the history, physical exam, assessment and plan as outlined above. I was immediately available for  face-to-face consultation. Star Age, MD, PhD [facility])

## 2016-04-14 NOTE — Patient Instructions (Signed)
Continue aspirin  Blood Pressure Goal <130/90 Cholesterol LDL < 70 HgbA1c <6.5 % Continue using CPAP nightly If your symptoms worsen or you develop new symptoms please let us know.

## 2016-04-15 NOTE — Progress Notes (Signed)
I agree with the above plan 

## 2016-04-23 IMAGING — MR MR MRA HEAD W/O CM
1 series · 19 of 48 positions shown · non-contrast
Comparison: 07/01/2014 brain MR.

CLINICAL DATA: 63-year-old hypertensive female with history of
smoking presenting with left hand numbness and clumsiness. Small
acute infarct right precentral cortex noted on recent MR. Subsequent
encounter.

EXAM:
MRA HEAD WITHOUT CONTRAST
TECHNIQUE: Angiographic images of the Circle of Willis were obtained using MRA
technique without intravenous contrast.

[Series 2: ax (id) 2 · axial · 1.2mm · 0.43mm/px · z∈[-105,+9]mm · 19 of 200 slices shown]
[im 1/200]
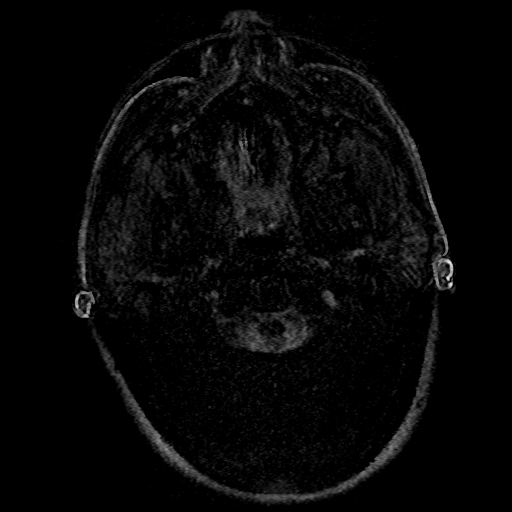
[im 5/200]
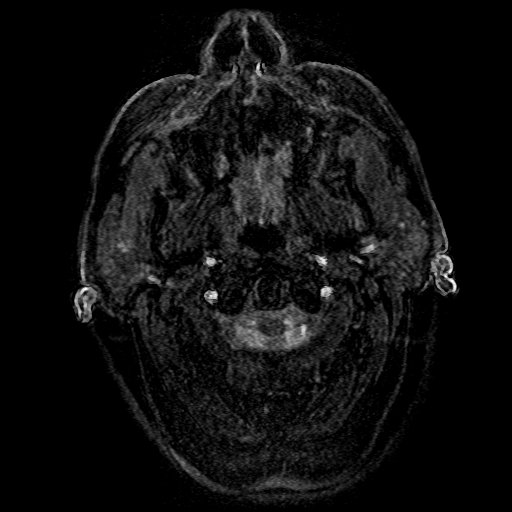
[im 9/200]
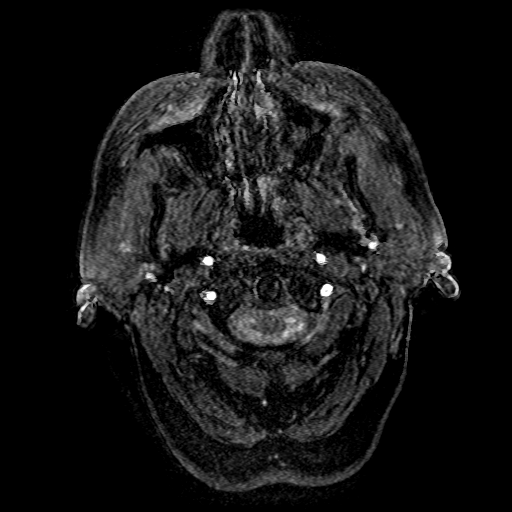
[im 13/200]
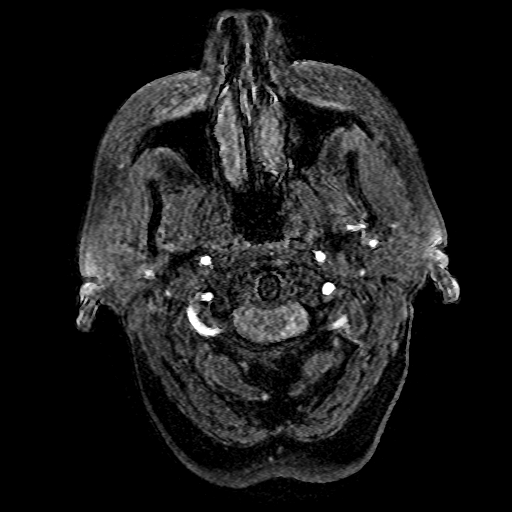
[im 17/200]
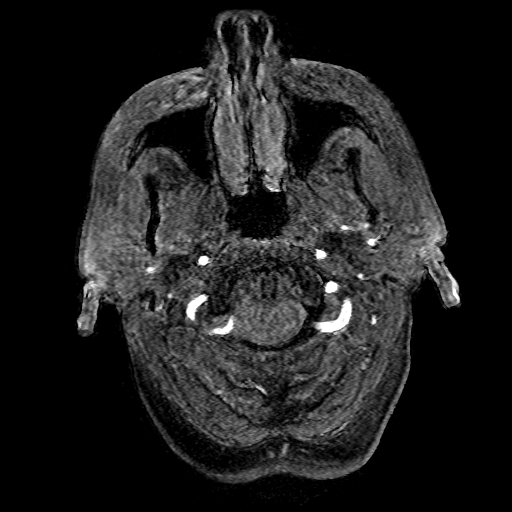
[im 22/200]
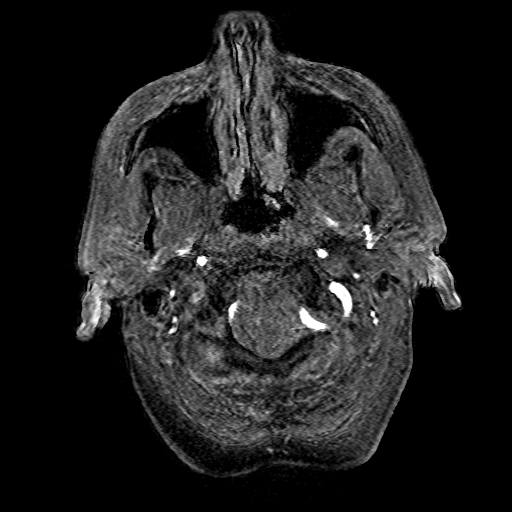
[im 26/200]
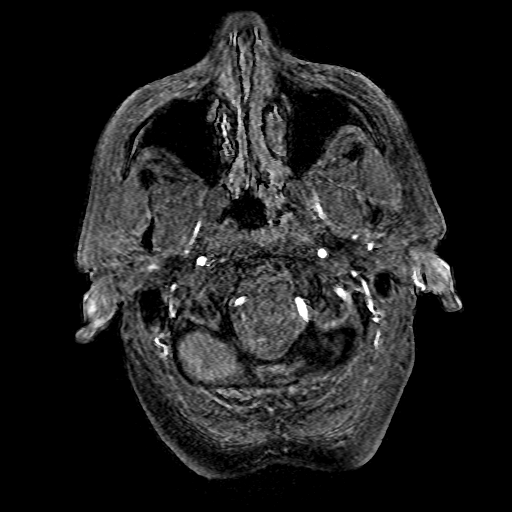
[im 30/200]
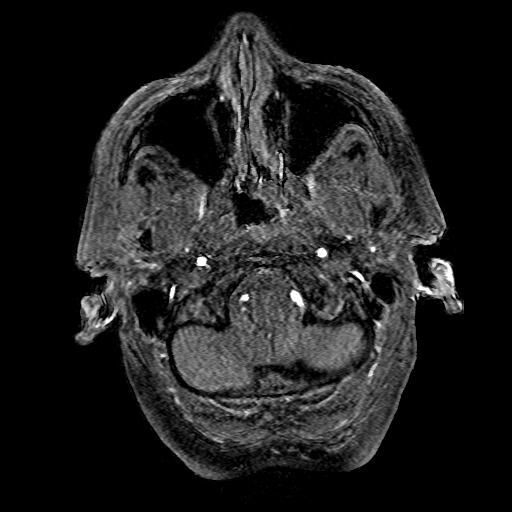
[im 34/200]
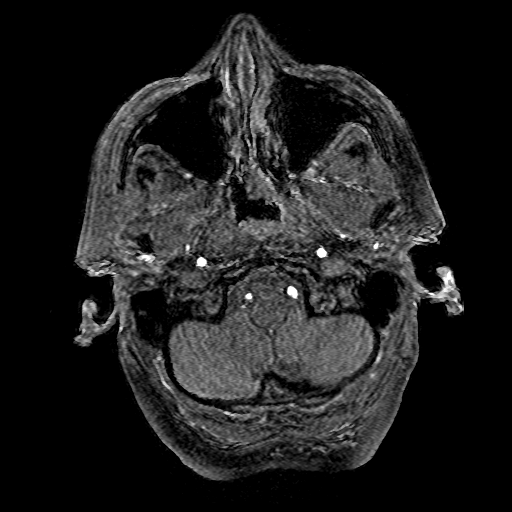
[im 39/200]
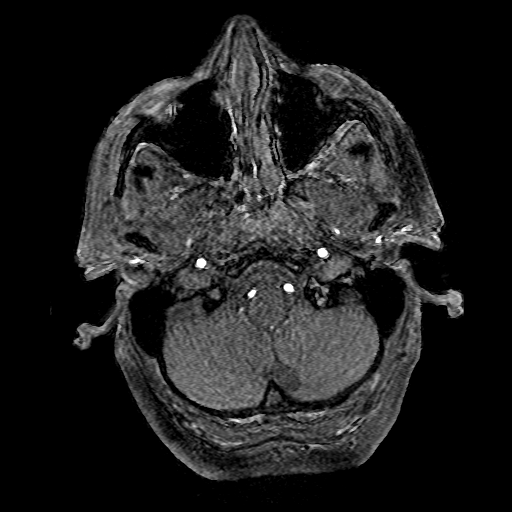
[im 43/200]
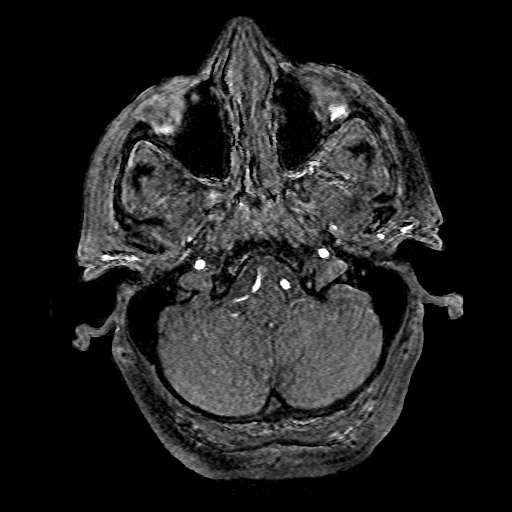
[im 64/200]
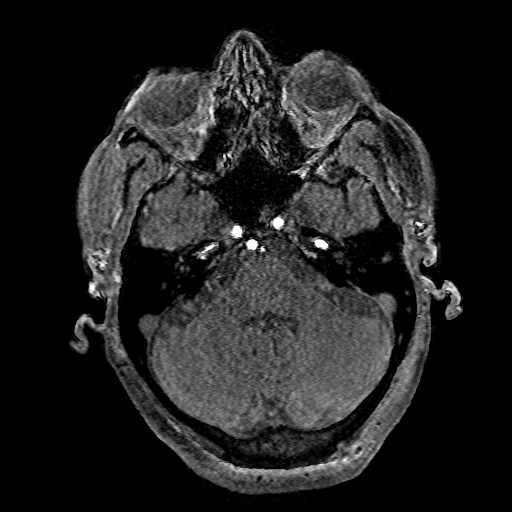
[im 89/200]
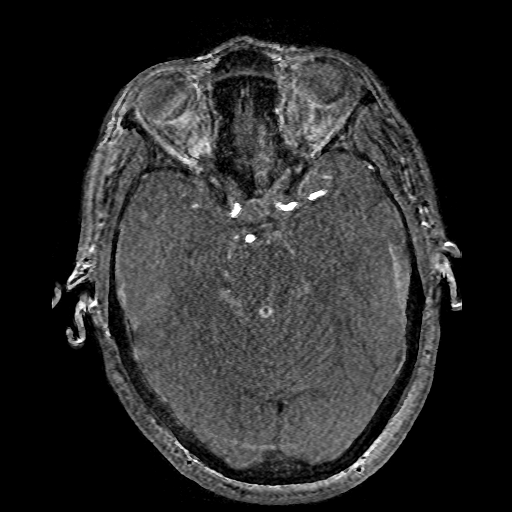
[im 102/200]
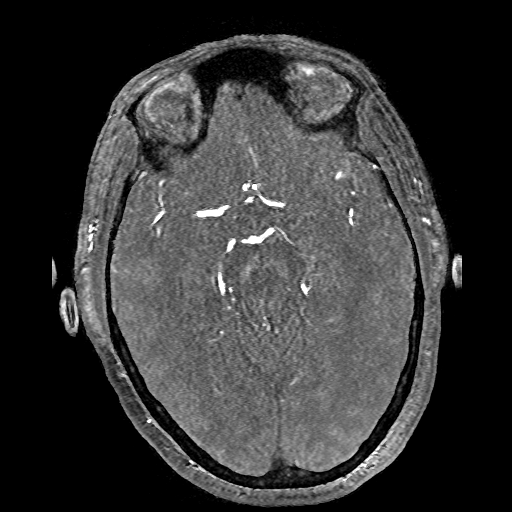
[im 115/200]
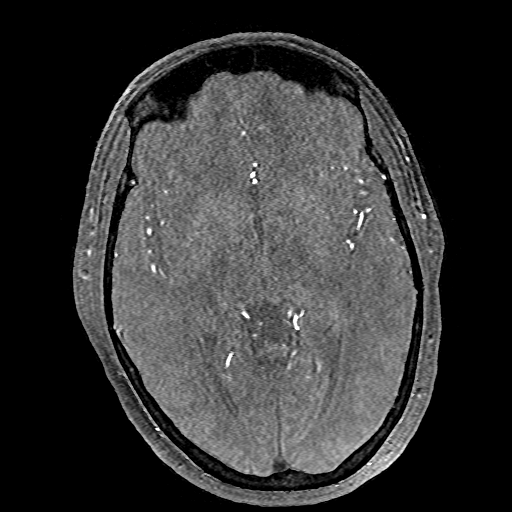
[im 140/200]
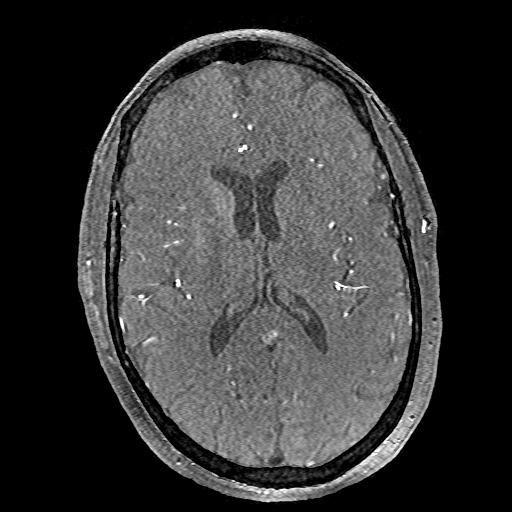
[im 166/200]
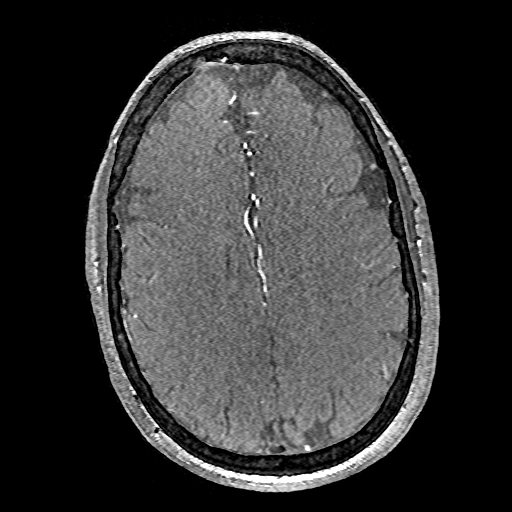
[im 170/200]
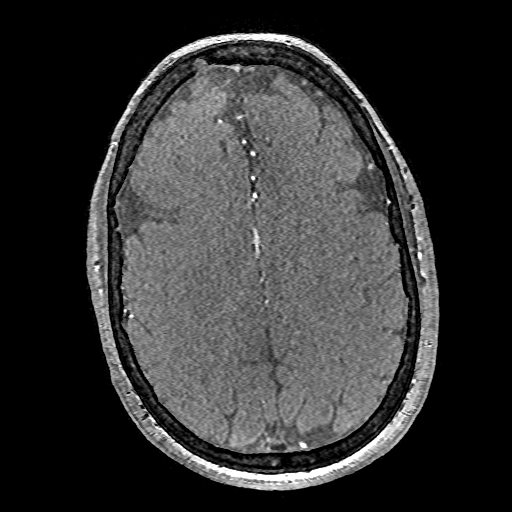
[im 191/200]
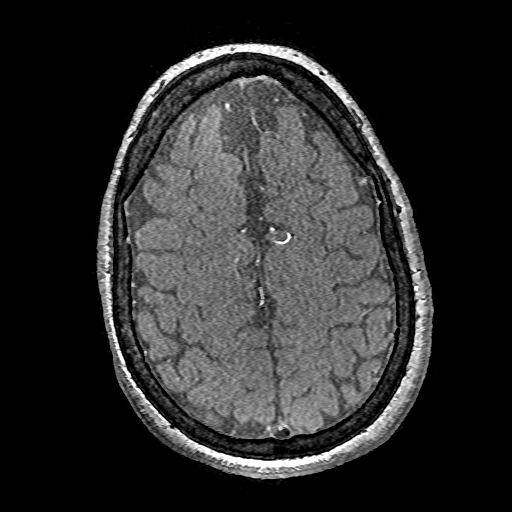

[19 of 48 positions shown; findings below may reference images not displayed]

FINDINGS: Motion degradation contributes to appearance of branch vessel
irregularity.

Mild narrowing right internal carotid artery cavernous/supraclinoid
segment junction.

4.4 mm aneurysm suspected arising at the junction of the A1/ A2
segment of the right anterior cerebral artery and directed
inferiorly.

No high-grade stenosis of either carotid terminus or M1 segment of
the middle cerebral artery.

Middle cerebral artery and A2 segment anterior cerebral artery
branch vessel irregularity bilaterally may be related to
atherosclerotic disease and/or result of motion artifact.

No significant stenosis of either distal vertebral artery.

Left posterior inferior cerebellar artery and right anterior
inferior cerebellar artery not visualized.

No significant stenosis of the basilar artery.

Mild irregularity of the superior cerebellar artery and distal
branches of the posterior cerebral artery bilaterally
IMPRESSION: 4.4 mm aneurysm suspected arising at the junction of the A1/ A2
segment of the right anterior cerebral artery and directed
inferiorly.

Mild narrowing right internal carotid artery cavernous/supraclinoid
segment junction.

No high-grade stenosis of either carotid terminus or M1 segment of
the middle cerebral artery.

Branch vessel irregularity as detailed above.

These results will be called to the ordering clinician or
representative by the Radiologist Assistant, and communication
documented in the PACS or zVision Dashboard.

## 2016-04-23 IMAGING — MR MR HEAD W/O CM
9 of 10 series · 36 of 48 positions shown · non-contrast
Comparison: None.

CLINICAL DATA: Left arm weakness and tingling began 2 a.m. today.

EXAM:
MRI HEAD WITHOUT CONTRAST
TECHNIQUE: Multiplanar, multiecho pulse sequences of the brain and surrounding
structures were obtained without intravenous contrast.

[Series 3: T1 · sagittal · 5.0mm · 0.47mm/px · 2 of 23 slices shown]
[im 1/23]
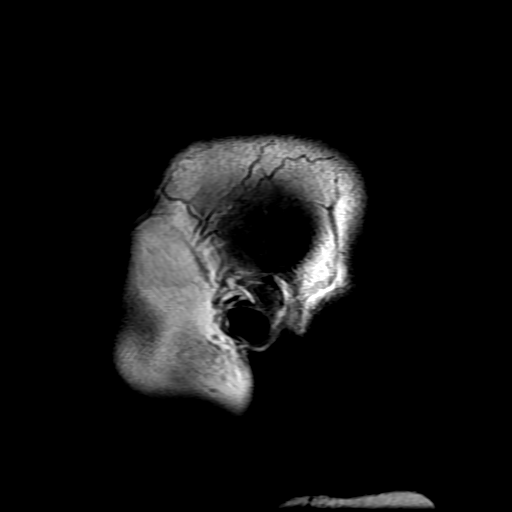
[im 23/23]
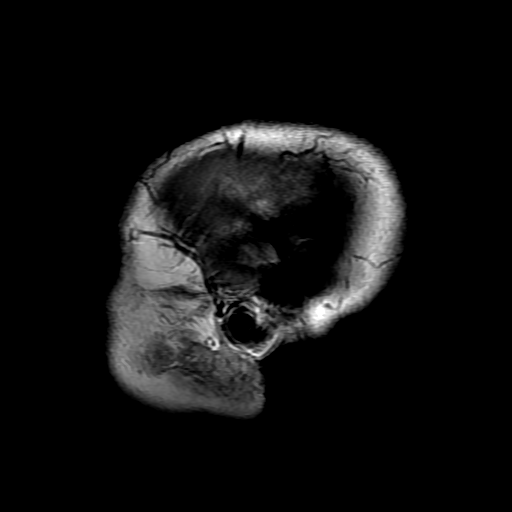

[Series 4: DWI · axial · 3.0mm · 1.09mm/px · z∈[-23,+108]mm · 9 of 90 slices shown (1 of 4)]
[im 1/90]
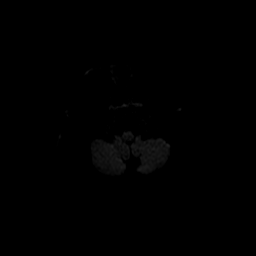
[im 12/90]
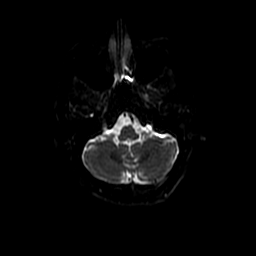
[im 23/90]
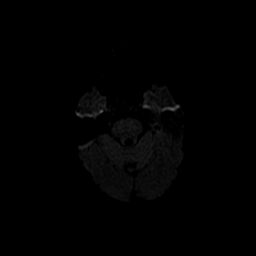
[im 34/90]
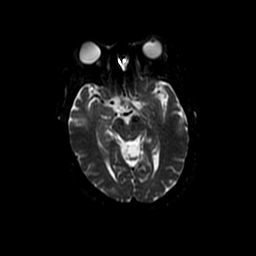
[im 45/90]
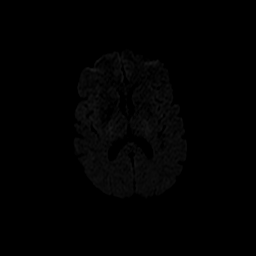
[im 56/90]
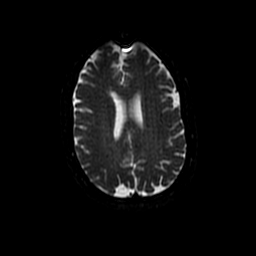
[im 67/90]
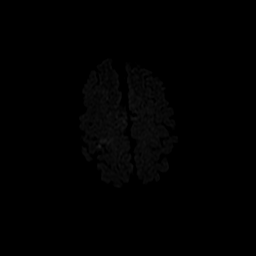
[im 78/90]
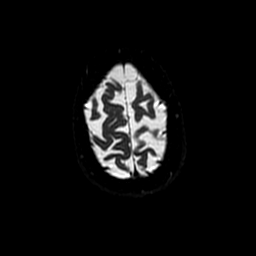
[im 90/90]
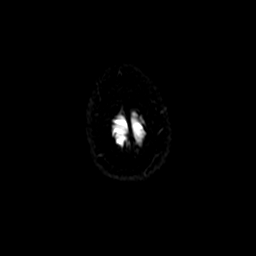

[Series 5: T2 · axial · 5.0mm · 0.47mm/px · z∈[-34,+116]mm · 3 of 26 slices shown (1 of 2)]
[im 1/26]
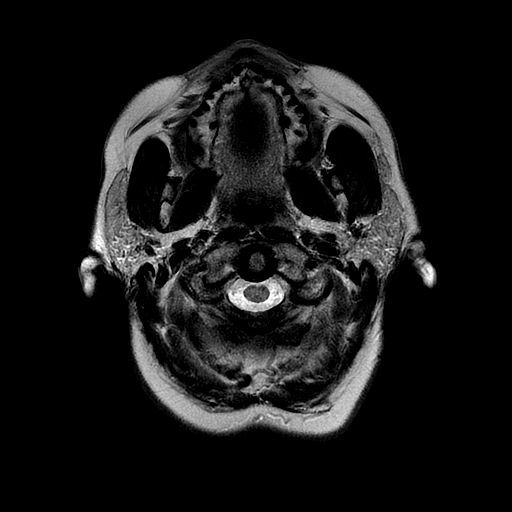
[im 13/26]
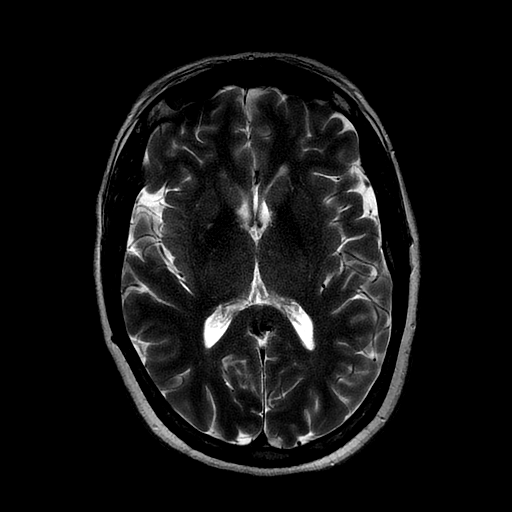
[im 26/26]
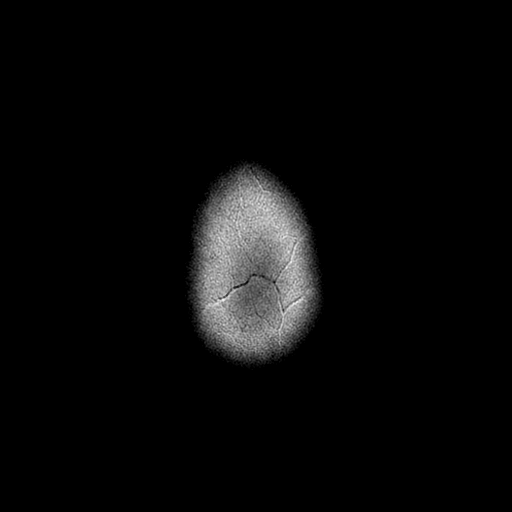

[Series 6: DWI · coronal · 5.0mm · 1.09mm/px · 7 of 66 slices shown (2 of 4)]
[im 1/66]
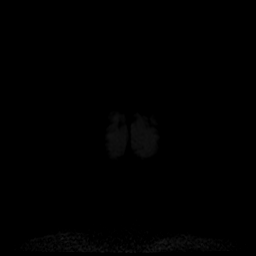
[im 11/66]
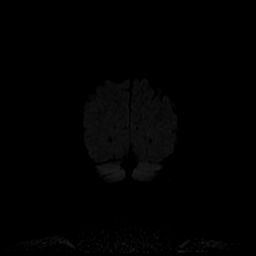
[im 22/66]
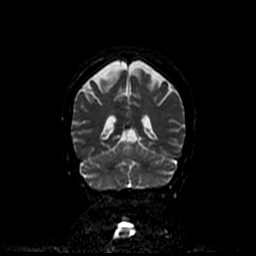
[im 33/66]
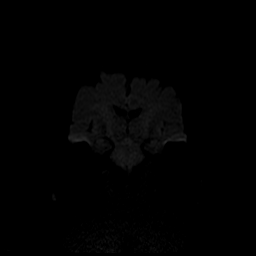
[im 44/66]
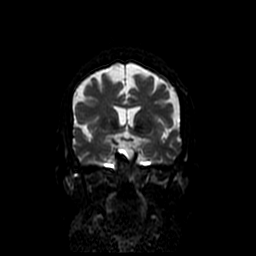
[im 55/66]
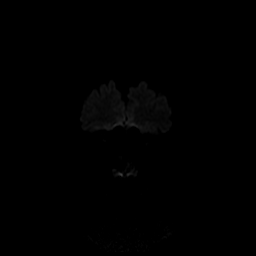
[im 66/66]
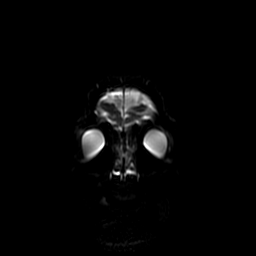

[Series 7: FLAIR · axial · 5.0mm · 0.47mm/px · z∈[-34,+116]mm · 3 of 26 slices shown]
[im 1/26]
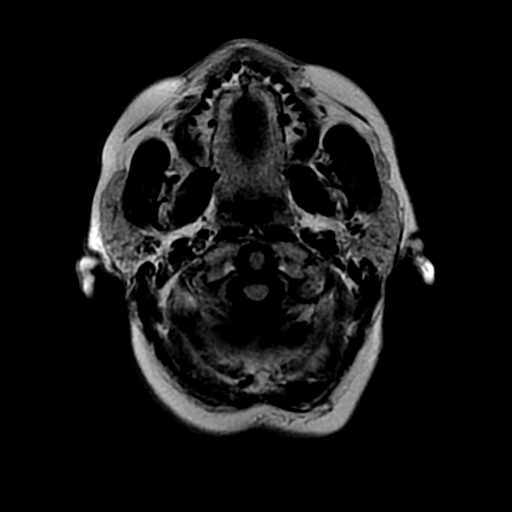
[im 13/26]
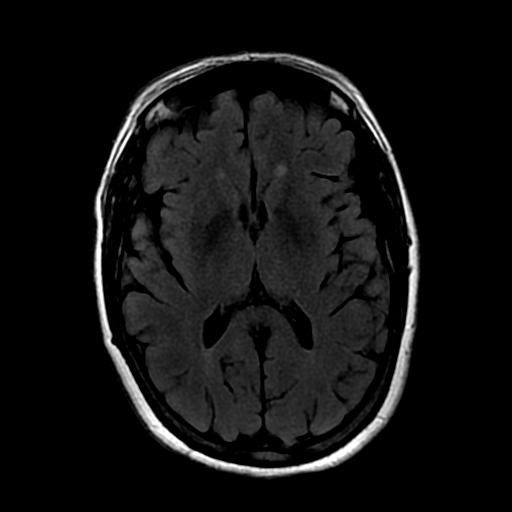
[im 26/26]
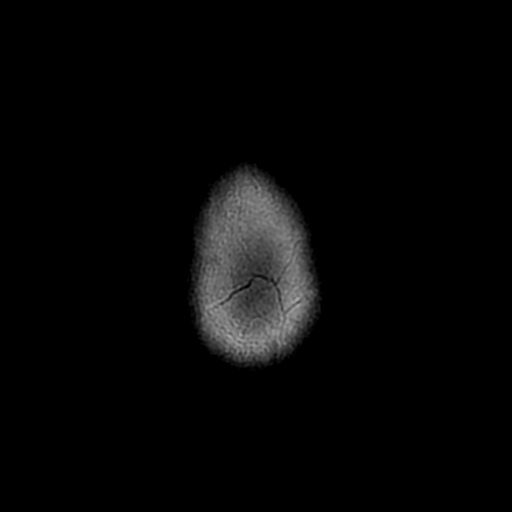

[Series 8: ax mpgr · axial · 5.0mm · 0.47mm/px · 1 of 22 slices shown]
[im 1/22]
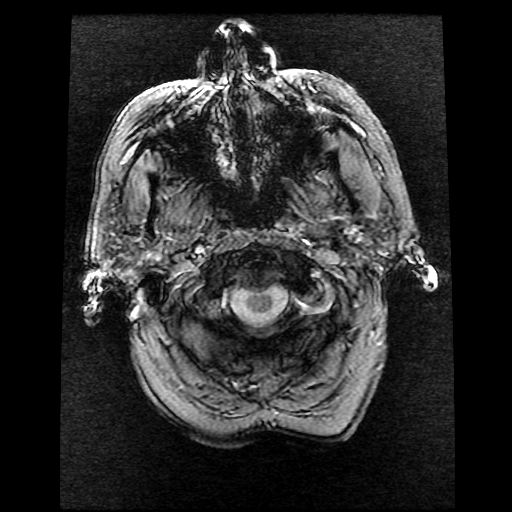

[Series 10: T2 · coronal · 5.0mm · 0.39mm/px · 3 of 25 slices shown (2 of 2)]
[im 1/25]
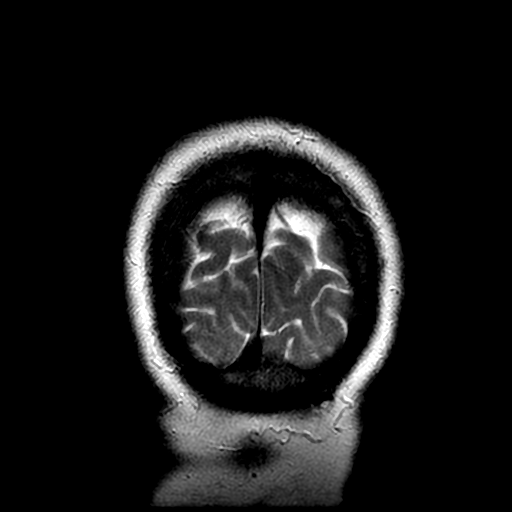
[im 13/25]
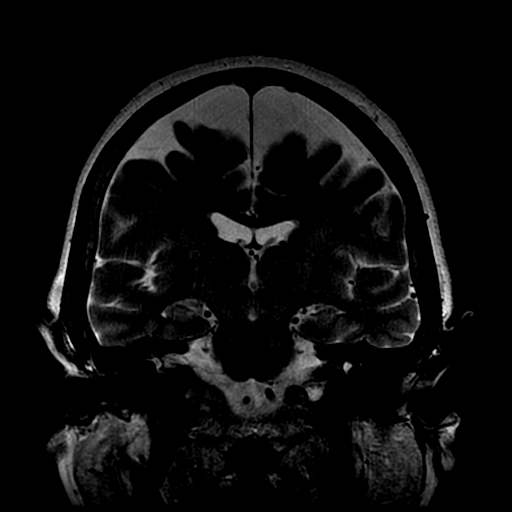
[im 25/25]
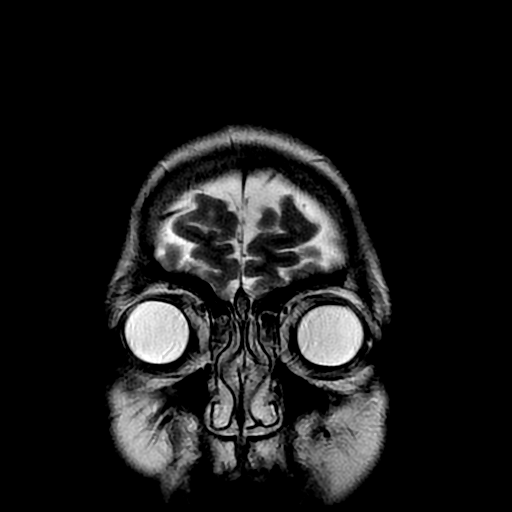

[Series 400: DWI · axial · 3.0mm · 1.09mm/px · z∈[-23,+108]mm · 5 of 45 slices shown (3 of 4)]
[im 1/45]
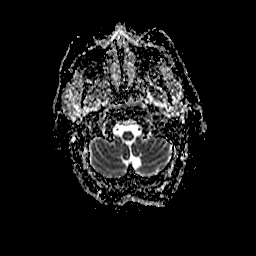
[im 12/45]
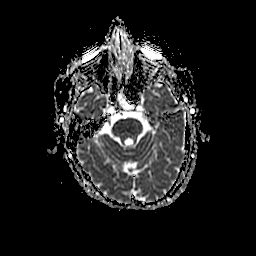
[im 23/45]
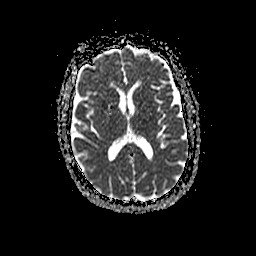
[im 34/45]
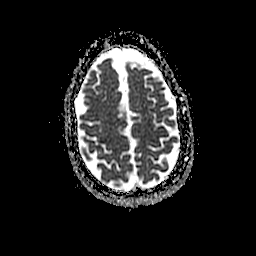
[im 45/45]
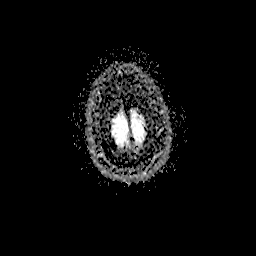

[Series 600: DWI · coronal · 5.0mm · 1.09mm/px · 3 of 33 slices shown (4 of 4)]
[im 1/33]
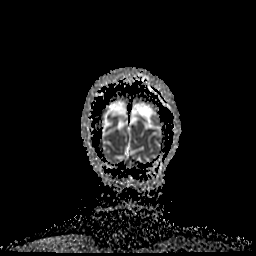
[im 17/33]
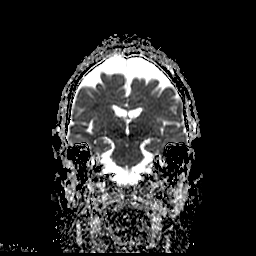
[im 33/33]
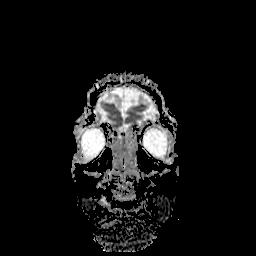

[36 of 48 positions shown; findings below may reference images not displayed]

FINDINGS: Small area of acute infarct in the right precentral cortex over the
convexity. This appears to be in the precentral cortex with
scattered small areas of patchy restricted diffusion. No other acute
infarct.

Ventricle size normal.  Cerebral volume normal.

Negative for intracranial hemorrhage.

Negative for mass or edema.

Paranasal sinuses are clear.

Enlargement of the sella which is filled with CSF. Possible
arachnoid cyst versus empty sella. Pituitary normal in size.
IMPRESSION: Small area of acute infarct right precentral cortex over the
convexity. Negative for hemorrhage.

## 2016-05-06 ENCOUNTER — Encounter: Payer: Self-pay | Admitting: Podiatry

## 2016-05-06 ENCOUNTER — Ambulatory Visit (INDEPENDENT_AMBULATORY_CARE_PROVIDER_SITE_OTHER): Payer: Medicare Other | Admitting: Podiatry

## 2016-05-06 DIAGNOSIS — L600 Ingrowing nail: Secondary | ICD-10-CM

## 2016-05-06 DIAGNOSIS — M722 Plantar fascial fibromatosis: Secondary | ICD-10-CM

## 2016-05-07 NOTE — Progress Notes (Signed)
Subjective:     Patient ID: Hannah Nielsen, female   DOB: Sep 24, 1950, 67 y.o.   MRN: [id number]  HPI patient presents stating the pain has improved with pain still noted upon deep palpation heel and ingrown toenail that's fixed well on the left   Review of Systems     Objective:   Physical Exam Neurovascular status intact negative Homans sign was noted with patient's right heel doing well with minimal pain upon deep palpation and the left ingrown    Assessment:     Doing well post fasciitis ingrown toenail    Plan:     Advised on physical therapy anti-inflammatories supportive shoes and reappoint to recheck

## 2016-07-02 ENCOUNTER — Other Ambulatory Visit: Payer: Self-pay | Admitting: Internal Medicine

## 2016-07-21 ENCOUNTER — Other Ambulatory Visit: Payer: Self-pay | Admitting: Internal Medicine

## 2016-07-21 NOTE — Telephone Encounter (Signed)
At last office visit, patient reported not taking this medication and she has not had it refilled since 06/2015. Please advise. Thanks, MI

## 2016-07-22 NOTE — Telephone Encounter (Signed)
Will route to Dr. Harrington Challenger for recommendations.

## 2016-07-30 ENCOUNTER — Other Ambulatory Visit: Payer: Self-pay | Admitting: Internal Medicine

## 2016-10-06 ENCOUNTER — Ambulatory Visit: Payer: Medicare Other | Admitting: Neurology

## 2016-10-10 ENCOUNTER — Other Ambulatory Visit: Payer: Self-pay | Admitting: Geriatric Medicine

## 2016-10-10 DIAGNOSIS — F17211 Nicotine dependence, cigarettes, in remission: Secondary | ICD-10-CM

## 2016-10-14 ENCOUNTER — Ambulatory Visit: Payer: Medicare Other | Admitting: Internal Medicine

## 2016-10-15 NOTE — Progress Notes (Signed)
Cardiology Office Note   Date:  10/17/2016   ID:  Hannah Nielsen, DOB 1951/02/26, MRN [id number]  PCP:  Lajean Manes, MD  Cardiologist:   Dorris Carnes, MD   F/U of CAD    History of Present Illness: Hannah Nielsen is a 66 y.o. female with a history of HTN, tobacco abuse and CAD. She suffered a CVA Work up showed L atrial papillary fibroelastoma as well as a 4.16mm R ACA aneurysm. She underwent removal of fibroelastoma and CABG x 2 (LIMA TO LAD and SVG to RCA). In addition underwent PFO closure. This was in 2016  Since I saw her she has done well  Denies CP  Breating is OK     Outpatient Medications Prior to Visit  Medication Sig Dispense Refill  . amLODipine (NORVASC) 5 MG tablet TAKE 1 TABLET(5 MG) BY MOUTH DAILY 90 tablet 1  . aspirin 81 MG tablet Take 81 mg by mouth daily.    Marland Kitchen atorvastatin (LIPITOR) 40 MG tablet Take 40 mg by mouth daily at 6 PM.    . calcium carbonate (OS-CAL) 600 MG TABS tablet Take 600 mg by mouth daily.    . furosemide (LASIX) 40 MG tablet Take half (1/2) to one (1) tablet (20 mg to 40 mg total) by mouth daily as needed for fluid.    . hydrochlorothiazide (MICROZIDE) 12.5 MG capsule TAKE 1 CAPSULE(12.5 MG) BY MOUTH DAILY 90 capsule 1  . KRILL OIL PO Take 1 tablet by mouth daily.    . LUTEIN PO Take 1 tablet by mouth daily.    . Multiple Vitamins-Minerals (MULTIVITAL) tablet Take 1 tablet by mouth daily.    . nicotine polacrilex (COMMIT) 2 MG lozenge Take 2 mg by mouth as needed for smoking cessation.    Marland Kitchen oxybutynin (DITROPAN-XL) 10 MG 24 hr tablet Take 10 mg by mouth at bedtime.    . polyethylene glycol (MIRALAX / GLYCOLAX) packet Take 17 g by mouth daily as needed for mild constipation.    . Cholecalciferol (VITAMIN D-3) 1000 UNITS CAPS Take 1,000 Units by mouth daily.    . furosemide (LASIX) 40 MG tablet TAKE 1 TABLET(40 MG) BY MOUTH DAILY (Patient not taking: Reported on 10/17/2016) 30 tablet 0   Facility-Administered Medications Prior to Visit    Medication Dose Route Frequency Provider Last Rate Last Dose  . triamcinolone acetonide (KENALOG) 10 MG/ML injection 10 mg  10 mg Other Once Wallene Huh, DPM         Allergies:   Sulfa antibiotics   Past Medical History:  Diagnosis Date  . Aneurysm of anterior cerebral artery: Right per MRI 07/01/14 07/02/2014   4.4 mm  . CAD (coronary artery disease), native coronary artery: Severe 2 vessel dz per cardiac cath 07/04/14 07/04/2014  . Cataract    left immature  . Constipation    takes Miralax daily as needed  . GERD (gastroesophageal reflux disease)    rarely takes Tums  . Headache    rarely   . History of colon polyps    benign  . Hyperlipidemia 07/02/2014   takes Lipitor daily  . Hypertension    takes Amlodipine and HCTZ daily  . Insomnia   . Left atrial mass 07/03/2014   benign papillary fibroelastoma  . Nocturia    takes Ditropan nightly  . Papillary fibroelastoma of heart   . S/P resection of left atrial benign papillary fibroelastoma and CABG x 2 07/10/2014   LIMA to LAD, SVG to RCA, [facility]  via right thigh  . Shortness of breath dyspnea    with exertion   . Stroke ([place]) 8/00/3491   acute embolic stroke to right MCA territory  . Tingling    left hand,left breast,and left hip  . Urinary frequency   . Urinary urgency     Past Surgical History:  Procedure Laterality Date  . ABDOMINAL HYSTERECTOMY    . COLONOSCOPY    . CORONARY ARTERY BYPASS GRAFT N/A 07/10/2014   Procedure: CORONARY ARTERY BYPASS GRAFTING (CABG), ON PUMP, TIMES TWO, USING LEFT INTERNAL MAMMARY ARTERY, RIGHT GREATER SAPHENOUS VEIN HARVESTED ENDOSCOPICALLY;  Surgeon: Rexene Alberts, MD;  Location: [place];  Service: Open Heart Surgery;  Laterality: N/A;  . EXCISION OF ATRIAL MYXOMA N/A 07/10/2014   Procedure: RESECTION OF LEFT ATRIAL MASS;  Surgeon: Rexene Alberts, MD;  Location: [place];  Service: Open Heart Surgery;  Laterality: N/A;  . LEFT HEART CATHETERIZATION WITH CORONARY ANGIOGRAM N/A 07/04/2014    Procedure: LEFT HEART CATHETERIZATION WITH CORONARY ANGIOGRAM;  Surgeon: Jettie Booze, MD;  Location: [facility] CATH LAB;  Service: Cardiovascular;  Laterality: N/A;  . RADIOLOGY WITH ANESTHESIA N/A 12/23/2014   Procedure: Stent supported coil embolization OR right ACA aneurysm;  Surgeon: Consuella Lose, MD;  Location: [place] NEURO ORS;  Service: Radiology;  Laterality: N/A;  . REPAIR OF PATENT FORAMEN OVALE N/A 07/10/2014   Procedure: REPAIR OF PATENT FORAMEN OVALE;  Surgeon: Rexene Alberts, MD;  Location: [place];  Service: Open Heart Surgery;  Laterality: N/A;  . TEE WITHOUT CARDIOVERSION N/A 07/03/2014   Procedure: TRANSESOPHAGEAL ECHOCARDIOGRAM (TEE);  Surgeon: Jerline Pain, MD;  Location: [place];  Service: Cardiovascular;  Laterality: N/A;  . TEE WITHOUT CARDIOVERSION N/A 07/10/2014   Procedure: TRANSESOPHAGEAL ECHOCARDIOGRAM (TEE);  Surgeon: Rexene Alberts, MD;  Location: [place];  Service: Open Heart Surgery;  Laterality: N/A;     Social History:  The patient  reports that she quit smoking about 2 years ago. She has never used smokeless tobacco. She reports that she drinks alcohol. She reports that she does not use drugs.   Family History:  The patient's family history includes Aneurysm in her father; Breast cancer in her maternal grandmother; Cerebral aneurysm (age of onset: 56) in her father; Heart attack in her daughter, maternal grandfather, and mother; Heart failure in her mother; Hypertension in her brother, mother, and sister.    ROS:  Please see the history of present illness. All other systems are reviewed and  Negative to the above problem except as noted.    PHYSICAL EXAM: VS:  BP 122/64   Pulse 82   Ht 5' 2.5" (1.588 m)   Wt 97.9 kg (215 lb 12.8 oz)   BMI 38.84 kg/m   GEN: Well nourished, well developed, in no acute distress  HEENT: normal  Neck: no JVD, carotid bruits, or masses Cardiac: RRR; no murmurs, rubs, or gallops,no edema  Respiratory:  clear to  auscultation bilaterally, normal work of breathing GI: soft, nontender, nondistended, + BS  No hepatomegaly  MS: no deformity Moving all extremities   Skin: warm and dry, no rash Neuro:  Strength and sensation are intact Psych: euthymic mood, full affect   EKG:  EKG is  ordered today.  SR 82 bpm  Incomp RBBB   Lipid Panel    Component Value Date/Time   CHOL 128 01/04/2016 1004   TRIG 129 01/04/2016 1004   HDL 51 01/04/2016 1004   CHOLHDL 2.5 01/04/2016 1004  VLDL 26 01/04/2016 1004   LDLCALC 51 01/04/2016 1004      Wt Readings from Last 3 Encounters:  10/17/16 97.9 kg (215 lb 12.8 oz)  04/14/16 93.9 kg (207 lb)  03/25/16 90.7 kg (200 lb)      ASSESSMENT AND PLAN:  1  CAD  No symptoms of angina   2.  HTN  Adquate control    3  HL Excellent control of LDL   4  Hx CVA    5   Obesity     Encouraged increased activity     5  CVA  Due to fibroestoma  S/p surgery  F/U in 9 months      Current medicines are reviewed at length with the patient today.  The patient does not have concerns regarding medicines.   Signed, Dorris Carnes, MD  10/17/2016 1:50 PM    [facility] [place], [place], [place]  [address] Phone: [phone]; Fax: [phone]

## 2016-10-17 ENCOUNTER — Ambulatory Visit (INDEPENDENT_AMBULATORY_CARE_PROVIDER_SITE_OTHER): Payer: Medicare Other | Admitting: Internal Medicine

## 2016-10-17 ENCOUNTER — Encounter: Payer: Self-pay | Admitting: Internal Medicine

## 2016-10-17 ENCOUNTER — Ambulatory Visit
Admission: RE | Admit: 2016-10-17 | Discharge: 2016-10-17 | Disposition: A | Discharge status: Home / Self Care | Payer: Medicare Other | Source: Ambulatory Visit | Attending: Geriatric Medicine | Admitting: Geriatric Medicine

## 2016-10-17 VITALS — BP 122/64 | HR 82 | Ht 62.5 in | Wt 215.8 lb

## 2016-10-17 DIAGNOSIS — I2510 Atherosclerotic heart disease of native coronary artery without angina pectoris: Secondary | ICD-10-CM

## 2016-10-17 DIAGNOSIS — I10 Essential (primary) hypertension: Secondary | ICD-10-CM | POA: Diagnosis not present

## 2016-10-17 DIAGNOSIS — E782 Mixed hyperlipidemia: Secondary | ICD-10-CM | POA: Diagnosis not present

## 2016-10-17 DIAGNOSIS — F17211 Nicotine dependence, cigarettes, in remission: Secondary | ICD-10-CM

## 2016-10-17 NOTE — Patient Instructions (Addendum)
Your physician recommends that you continue on your current medications as directed. Please refer to the Current Medication list given to you today.  Your physician wants you to follow-up in: 9 months with Dr. Harrington Challenger.  You will receive a reminder letter in the mail two months in advance. If you don't receive a letter, please call our office to schedule the follow-up appointment.  Addendum:  Requested lab results from PCP be faxed to [phone].

## 2016-10-31 ENCOUNTER — Encounter: Payer: Self-pay | Admitting: Neurology

## 2016-10-31 ENCOUNTER — Ambulatory Visit (INDEPENDENT_AMBULATORY_CARE_PROVIDER_SITE_OTHER): Payer: Medicare Other | Admitting: Neurology

## 2016-10-31 VITALS — BP 149/85 | HR 80 | Ht 62.5 in | Wt 213.0 lb

## 2016-10-31 DIAGNOSIS — G4733 Obstructive sleep apnea (adult) (pediatric): Secondary | ICD-10-CM

## 2016-10-31 DIAGNOSIS — Z9989 Dependence on other enabling machines and devices: Secondary | ICD-10-CM | POA: Diagnosis not present

## 2016-10-31 NOTE — Patient Instructions (Addendum)
Please continue using your CPAP regularly. While your insurance requires that you use CPAP at least 4 hours each night on 70% of the nights, I recommend, that you not skip any nights and use it throughout the night if you can. Getting used to CPAP and staying with the treatment long term does take time and patience and discipline. Untreated obstructive sleep apnea when it is moderate to severe can have an adverse impact on cardiovascular health and raise her risk for heart disease, arrhythmias, hypertension, congestive heart failure, stroke and diabetes. Untreated obstructive sleep apnea causes sleep disruption, nonrestorative sleep, and sleep deprivation. This can have an impact on your day to day functioning and cause daytime sleepiness and impairment of cognitive function, memory loss, mood disturbance, and problems focussing. Using CPAP regularly can improve these symptoms. We will do a follow up in one year.  Keep up the good work! Work on your weight loss.

## 2016-10-31 NOTE — Progress Notes (Signed)
Subjective:    Patient ID: Hannah Nielsen is a 66 y.o. female.  HPI     Interim history:   Hannah Nielsen is a 66 year old right-handed woman with an underlying medical history of hypertension, ACA aneurysm (s/p coiling under Dr. [name] on 12/23/14), coronary artery disease, status post 2 vessel CABG on 07/10/2014, papillary fibroblastoma of the heart, stroke in March 2016, hyperlipidemia, recent smoking cessation, and obesity, who presents for follow-up consultation of her obstructive Nielsen apnea, established on CPAP therapy. The patient is unaccompanied today. I last saw her on 10/07/2015, at which time she was doing well. We reviewed recent blood work from her primary care physician's office. She was compliant with CPAP therapy. She was advised to follow-up in one year. She had some intermittent numbness and tingling of the left hand, able to shake it out.  She was seen in the interim by Ward Givens, nurse practitioner in January 2018 for her stroke follow-up and was advised to follow-up in one year, as she was stable.   Today, 10/31/2016: I reviewed her CPAP compliance data from 09/27/2016 through 10/26/2016 which is a total of 30 days, during which time she used her CPAP every night with percent used days greater than 4 hours at 100%, indicating superb compliance with an average usage of 6 hours and 31 minutes, residual AHI 1.7 per hour, leak low with the 95th percentile at 5.4 L/m on a pressure of 9 cm with EPR of 2. She reports doing well with CPAP, uses a fullface mask as she is a mouth breather, feeling well with CPAP, has gotten recent supplies but does need a new humidifier chamber. She had a recent chest CT for lung cancer screening, ordered by her PCP, I reviewed the results from 10/17/2016. She basically has a small area of abnormality which could be benign but does need monitoring, she will be undergoing repeat testing I believe in 6 months from this test. She tries to hydrate well with  water. Weight has gone up just a little bit. She saw her cardiologist in follow-up in both cardiologist and primary care physician have encouraged her to lose weight.   The patient's allergies, current medications, family history, past medical history, past social history, past surgical history and problem list were reviewed and updated as appropriate.   Previously (copied from previous notes for reference):   I saw her on 04/16/2015, at which time we talked about her Nielsen test results from her baseline Nielsen study on 01/18/2015 as well as her CPAP titration study from 02/08/2015. She was doing well with CPAP therapy and was also very pleased with her aneurysm coiling results. She felt improved with CPAP. She was fully compliant with treatment. She had no residual stroke related symptoms from March 2016. She quit smoking at the time of her stroke.    I reviewed her CPAP compliance data from 09/06/2015 through 10/05/2015, which is a total of 30 days, during which time she used her machine every night with percent used days greater than 4 hours at 93%, indicating excellent compliance with an average usage of 6 hours and 2 minutes, residual AHI 0.7 per hour, leak low with the 95th percentile at 11.6 L/m on a pressure of 9 cm with EPR of 2.   I first met her on 12/09/2014 at the request of Ward Givens and Dr. Leonie Man, at which time the patient reported snoring, witnessed apneic breathing pauses and excessive daytime somnolence. I invited her back for Nielsen study. She  had a baseline Nielsen study, followed by a CPAP titration study. I went over her test results with her in detail today. Her baseline Nielsen study from 01/18/2015 showed a Nielsen efficiency of 57.4% with a latency to Nielsen of 24 minutes and wake after Nielsen onset of 139 minutes with mild to moderate Nielsen fragmentation noted. She had an elevated arousal index. She had moderate PLMS with an index of 33 per hour, resulting in 11.7 arousals per hour.  She had an increased percentage of light stage Nielsen, absence of slow-wave Nielsen and a decreased percentage of REM Nielsen with a prolonged REM latency. Average oxygen saturation was 93%, nadir was 87%. She had mild to moderate snoring. Total AHI was 15.5 per hour. Based on her Nielsen-related complaints and her Nielsen study results, I invited her back for a full night CPAP titration study. She had this on 02/08/2015. Nielsen efficiency was 76.4% with a latency to Nielsen of 40 minutes and wake after Nielsen onset of 68.5 minutes with mild to moderate Nielsen fragmentation noted. She had an arousal index of 10 per hour. She had an increased percentage of stage II Nielsen, absence of slow-wave Nielsen, and a decreased percentage of REM Nielsen at 14.2% with a prolonged REM latency of 130 minutes. She had no significant PLMS, EKG or EEG changes. Average oxygen saturation was 92%, nadir was 89%. CPAP was titrated from 5 cm to 11 cm. AHI was 3.7 per hour on the pressure of 9 cm. She had evidence of increase in central apneas on pressure is being on 9 cm. Based on her test results are prescribed CPAP therapy for home use.   I reviewed her CPAP compliance data from 03/16/2015 through 04/14/2015 which is a total of 30 days during which time she used her machine every night with percent used days greater than 4 hours at 100%, indicating superb compliance with an average usage of 7 hours and 45 minutes, residual AHI 2.4 per hour, leak low with the 95th percentile at 4.1 L/m on a pressure of 9 cm with EPR of 2.    12/09/2014: She reports snoring, witnessed apneas per daughter (recently shared a hotel room) and excessive daytime somnolence. As far as her stroke is concerned. She had woken up with a numbness and weakness of her left hand. Her weakness has improved and she has residual numbness in her left hand, lateral aspect. She is using nicotine lozenges and has quit smoking on 07/01/2014 at the time of her stroke diagnosis. She drinks  alcohol very occasionally. She drinks coffee 2 cups a day and no sodas and typically no other caffeine-containing beverages. She tries to drink water. She has no known family history of obstructive Nielsen apnea but does have a family history of early or sudden death. Her father snored heavily. She reports a bedtime of around 9 or 10 PM and a rise time around 4:30 or 5. She typically does not wake up rested and her Epworth sleepiness score is elevated at 15 out of 24 today, her fatigue score is 42 out of 63. She lives alone. 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 She is divorced 2. She has 3 grown children from her first marriage. She has a family history of brain aneurysm in her father who died when she was 10 years old from a ruptured brain aneurysm and his mother, her paternal grandmother had a brain aneurysm as well. Dr. Dorris Carnes is her cardiologist, Dr. Roxy Manns is her CT  Psychologist, sport and exercise. She denies frank restless leg symptoms and is not known to twitch her legs and her Nielsen. She does wake up frequently in the middle of the night. She has to use the bathroom on average 2-3 times per night.  Her Past Medical History Is Significant For: Past Medical History:  Diagnosis Date  . Aneurysm of anterior cerebral artery: Right per MRI 07/01/14 07/02/2014   4.4 mm  . CAD (coronary artery disease), native coronary artery: Severe 2 vessel dz per cardiac cath 07/04/14 07/04/2014  . Cataract    left immature  . Constipation    takes Miralax daily as needed  . GERD (gastroesophageal reflux disease)    rarely takes Tums  . Headache    rarely   . History of colon polyps    benign  . Hyperlipidemia 07/02/2014   takes Lipitor daily  . Hypertension    takes Amlodipine and HCTZ daily  . Insomnia   . Left atrial mass 07/03/2014   benign papillary fibroelastoma  . Nocturia    takes Ditropan nightly  . Papillary fibroelastoma of heart   . S/P resection of left atrial benign papillary fibroelastoma and CABG  x 2 07/10/2014   LIMA to LAD, SVG to RCA, EVH via right thigh  . Shortness of breath dyspnea    with exertion   . Stroke ([place]) 4/40/3474   acute embolic stroke to right MCA territory  . Tingling    left hand,left breast,and left hip  . Urinary frequency   . Urinary urgency     Her Past Surgical History Is Significant For: Past Surgical History:  Procedure Laterality Date  . ABDOMINAL HYSTERECTOMY    . COLONOSCOPY    . CORONARY ARTERY BYPASS GRAFT N/A 07/10/2014   Procedure: CORONARY ARTERY BYPASS GRAFTING (CABG), ON PUMP, TIMES TWO, USING LEFT INTERNAL MAMMARY ARTERY, RIGHT GREATER SAPHENOUS VEIN HARVESTED ENDOSCOPICALLY;  Surgeon: Rexene Alberts, MD;  Location: [place];  Service: Open Heart Surgery;  Laterality: N/A;  . EXCISION OF ATRIAL MYXOMA N/A 07/10/2014   Procedure: RESECTION OF LEFT ATRIAL MASS;  Surgeon: Rexene Alberts, MD;  Location: [place];  Service: Open Heart Surgery;  Laterality: N/A;  . LEFT HEART CATHETERIZATION WITH CORONARY ANGIOGRAM N/A 07/04/2014   Procedure: LEFT HEART CATHETERIZATION WITH CORONARY ANGIOGRAM;  Surgeon: Jettie Booze, MD;  Location: [facility] CATH LAB;  Service: Cardiovascular;  Laterality: N/A;  . RADIOLOGY WITH ANESTHESIA N/A 12/23/2014   Procedure: Stent supported coil embolization OR right ACA aneurysm;  Surgeon: Consuella Lose, MD;  Location: [place] [facility]URO ORS;  Service: Radiology;  Laterality: N/A;  . REPAIR OF PATENT FORAMEN OVALE N/A 07/10/2014   Procedure: REPAIR OF PATENT FORAMEN OVALE;  Surgeon: Rexene Alberts, MD;  Location: [place];  Service: Open Heart Surgery;  Laterality: N/A;  . TEE WITHOUT CARDIOVERSION N/A 07/03/2014   Procedure: TRANSESOPHAGEAL ECHOCARDIOGRAM (TEE);  Surgeon: Jerline Pain, MD;  Location: [place];  Service: Cardiovascular;  Laterality: N/A;  . TEE WITHOUT CARDIOVERSION N/A 07/10/2014   Procedure: TRANSESOPHAGEAL ECHOCARDIOGRAM (TEE);  Surgeon: Rexene Alberts, MD;  Location: [place];  Service: Open Heart Surgery;   Laterality: N/A;    Her Family History Is Significant For: Family History  Problem Relation Age of Onset  . Hypertension Mother   . Heart failure Mother        Deceased  . Heart attack Mother   . Cerebral aneurysm Father 94       Deceased  . Aneurysm Father   .  Breast cancer Maternal Grandmother   . Heart attack Maternal Grandfather   . Hypertension Sister   . Hypertension Brother   . Heart attack Daughter   . Stroke Neg Hx     Her Social History Is Significant For: Social History   Social History  . Marital status: Divorced    Spouse name: N/A  . Number of children: 3  . Years of education: college   Occupational History  .      retired Pharmacist, hospital   Social History Main Topics  . Smoking status: Former Smoker    Quit date: 07/01/2014  . Smokeless tobacco: Never Used     Comment: Uses nicotiene losenges  . Alcohol use 0.0 oz/week     Comment: rare  . Drug use: No  . Sexual activity: Not Asked   Other Topics Concern  . None   Social History Narrative   Patient lives at home alone and she is divorced.   Retired Pharmacist, hospital.   Education college.   Right handed.   Caffeine coffee two cups.    Her Allergies Are:  Allergies  Allergen Reactions  . Sulfa Antibiotics     Childhood allergy - unknown  :   Her Current Medications Are:  Outpatient Encounter Prescriptions as of 10/31/2016  Medication Sig  . amLODipine (NORVASC) 5 MG tablet TAKE 1 TABLET(5 MG) BY MOUTH DAILY  . aspirin 81 MG tablet Take 81 mg by mouth daily.  Marland Kitchen atorvastatin (LIPITOR) 40 MG tablet Take 40 mg by mouth daily at 6 PM.  . calcium carbonate (OS-CAL) 600 MG TABS tablet Take 600 mg by mouth daily.  . furosemide (LASIX) 40 MG tablet Take half (1/2) to one (1) tablet (20 mg to 40 mg total) by mouth daily as needed for fluid.  . hydrochlorothiazide (MICROZIDE) 12.5 MG capsule TAKE 1 CAPSULE(12.5 MG) BY MOUTH DAILY  . KRILL OIL PO Take 1 tablet by mouth daily.  . LUTEIN PO Take 1 tablet by mouth  daily.  . Multiple Vitamins-Minerals (MULTIVITAL) tablet Take 1 tablet by mouth daily.  . nicotine polacrilex (COMMIT) 2 MG lozenge Take 2 mg by mouth as needed for smoking cessation.  Marland Kitchen oxybutynin (DITROPAN-XL) 10 MG 24 hr tablet Take 10 mg by mouth at bedtime.  . polyethylene glycol (MIRALAX / GLYCOLAX) packet Take 17 g by mouth daily as needed for mild constipation.   Facility-Administered Encounter Medications as of 10/31/2016  Medication  . triamcinolone acetonide (KENALOG) 10 MG/ML injection 10 mg  :  Review of Systems:  Out of a complete 14 point review of systems, all are reviewed and negative with the exception of these symptoms as listed below:  Review of Systems  Neurological:       Pt presents today to follow up on her cpap. Pt says that her cpap is going well and that she has been getting good reports from her cpap. Pt is using [facility].    Objective:  Neurological Exam  Physical Exam Physical Examination:   Vitals:   10/31/16 1133  BP: (!) 149/85  Pulse: 80    General Examination: The patient is a very pleasant 66 y.o. female in no acute distress. She appears well-developed and well-nourished and well groomed. Good spirits.   HEENT: Normocephalic, atraumatic, pupils are equal, round and reactive to light and accommodation. Extraocular tracking is good without limitation to gaze excursion or nystagmus noted. Normal smooth pursuit is noted. Hearing is grossly intact. Face is symmetric with normal facial animation and  normal facial sensation. Speech is clear with no dysarthria noted. There is no hypophonia. There is no lip, neck/head, jaw or voice tremor. Neck is supple with full range of passive and active motion. There are no carotid bruits on auscultation. Oropharynx exam reveals: mild mouth dryness, adequate dental hygiene and moderate airway crowding. Tonsils are small. Mallampati is class III. Tongue protrudes centrally and palate elevates symmetrically.   Chest: Clear  to auscultation without wheezing, rhonchi or crackles noted.  Heart: S1+S2+0, regular and normal without murmurs, rubs or gallops noted.   Abdomen: Soft, non-tender and non-distended with normal bowel sounds appreciated on auscultation.  Extremities: There is trace pitting edema in the distal lower extremities bilaterally. Pedal pulses are intact.  Skin: Warm and dry without trophic changes noted. There are no varicose veins.  Musculoskeletal: exam reveals no obvious joint deformities, tenderness or joint swelling or erythema.   Neurologically:  Mental status: The patient is awake, alert and oriented in all 4 spheres. Her immediate and remote memory, attention, language skills and fund of knowledge are appropriate. There is no evidence of aphasia, agnosia, apraxia or anomia. Speech is clear with normal prosody and enunciation. Thought process is linear. Mood is normal and affect is normal.  Cranial nerves II - XII are as described above under HEENT exam. In addition: shoulder shrug is normal with equal shoulder height noted. Motor exam: Normal bulk, strength and tone is noted. There is no drift, tremor or rebound. Romberg is negative. Reflexes are 1-2+. Fine motor skills and coordination: grossly intact.  Cerebellar testing: No dysmetria or intention tremor on finger to nose testing. There is no truncal or gait ataxia.  Sensory exam: intact to light touch. Gait, station and balance: She stands easily. No veering to one side is noted. No leaning to one side is noted. Posture is age-appropriate and stance is narrow based. Gait shows normal stride length and normal pace. No problems turning are noted.   Assessment and plan:   In summary, Hannah Nielsen is a very pleasant 66 year old female with an underlying medical history of hypertension, ACA aneurysm (s/p elective stent-supported coil embolization under Dr. [name] on 12/23/14), coronary artery disease (s/p 2 vessel CABG on 07/10/2014),  papillary fibroblastoma of the heart, stroke in March 2016, hyperlipidemia, smoking cessation in March 2016, and obesity, who presents for follow-up consultation of her obstructive Nielsen apnea, established on CPAP at 9 cm with great compliance and ongoing good results reported. She had a baseline Nielsen study and CPAP titration study in October 2016. She has moderate obstructive Nielsen apnea, well treated with CPAP at 9 cm with full compliance and she is commended for her ongoing great treatment adherence. She has received updated supplies from her DME company. She had a recent CT chest earlier this month which showed a small abnormality on the left, she will have a follow-up CT as I understand in about 6 months. Physical exam is stable with the exception of mild weight gain in the interim. She is encouraged to monitor her weight and work on weight loss. She is reminded to stay well-hydrated. We reviewed her most recent compliance data for the past month. I suggested a one-year checkup from my end of things, she is reminded to keep her appointment with Dr. Leonie Man pending for January next year for routine stroke follow-up. I answered all her questions today and she was in agreement. I spent 20 minutes in total face-to-face time with the patient, more than 50% of which was  spent in counseling and coordination of care, reviewing test results, reviewing medication and discussing or reviewing the diagnosis of OSA, its prognosis and treatment options. Pertinent laboratory and imaging test results that were available during this visit with the patient were reviewed by me and considered in my medical decision making (see chart for details).

## 2017-01-01 ENCOUNTER — Other Ambulatory Visit: Payer: Self-pay | Admitting: Internal Medicine

## 2017-01-30 ENCOUNTER — Other Ambulatory Visit: Payer: Self-pay | Admitting: Internal Medicine

## 2017-04-07 ENCOUNTER — Encounter (HOSPITAL_COMMUNITY): Payer: Self-pay | Admitting: General Practice

## 2017-04-07 ENCOUNTER — Other Ambulatory Visit: Payer: Self-pay | Admitting: Urology

## 2017-04-13 ENCOUNTER — Other Ambulatory Visit: Payer: Self-pay

## 2017-04-13 ENCOUNTER — Ambulatory Visit (HOSPITAL_COMMUNITY): Payer: Medicare Other

## 2017-04-13 ENCOUNTER — Ambulatory Visit (HOSPITAL_COMMUNITY)
Admission: RE | Admit: 2017-04-13 | Discharge: 2017-04-13 | Disposition: A | Discharge status: Home / Self Care | Payer: Medicare Other | Source: Ambulatory Visit | Attending: Urology | Admitting: Urology

## 2017-04-13 ENCOUNTER — Encounter (HOSPITAL_COMMUNITY): Payer: Self-pay | Admitting: *Deleted

## 2017-04-13 ENCOUNTER — Encounter (HOSPITAL_COMMUNITY)
Admission: RE | Disposition: A | Discharge status: Home / Self Care | Payer: Self-pay | Source: Ambulatory Visit | Attending: Urology

## 2017-04-13 DIAGNOSIS — I10 Essential (primary) hypertension: Secondary | ICD-10-CM | POA: Diagnosis not present

## 2017-04-13 DIAGNOSIS — N201 Calculus of ureter: Secondary | ICD-10-CM | POA: Diagnosis not present

## 2017-04-13 DIAGNOSIS — Z79899 Other long term (current) drug therapy: Secondary | ICD-10-CM | POA: Insufficient documentation

## 2017-04-13 DIAGNOSIS — Z7982 Long term (current) use of aspirin: Secondary | ICD-10-CM | POA: Insufficient documentation

## 2017-04-13 DIAGNOSIS — Z87891 Personal history of nicotine dependence: Secondary | ICD-10-CM | POA: Diagnosis not present

## 2017-04-13 DIAGNOSIS — G4730 Sleep apnea, unspecified: Secondary | ICD-10-CM | POA: Diagnosis not present

## 2017-04-13 DIAGNOSIS — Z8673 Personal history of transient ischemic attack (TIA), and cerebral infarction without residual deficits: Secondary | ICD-10-CM | POA: Insufficient documentation

## 2017-04-13 DIAGNOSIS — E7800 Pure hypercholesterolemia, unspecified: Secondary | ICD-10-CM | POA: Diagnosis not present

## 2017-04-13 HISTORY — DX: Sleep apnea, unspecified: G47.30

## 2017-04-13 HISTORY — DX: Personal history of urinary calculi: Z87.442

## 2017-04-13 HISTORY — PX: EXTRACORPOREAL SHOCK WAVE LITHOTRIPSY: SHX1557

## 2017-04-13 SURGERY — LITHOTRIPSY, ESWL
Anesthesia: LOCAL | Laterality: Left

## 2017-04-13 MED ORDER — SODIUM CHLORIDE 0.9 % IV SOLN
INTRAVENOUS | Status: DC
  Administered 2017-04-13: 08:00:00 via INTRAVENOUS

## 2017-04-13 MED ORDER — DIPHENHYDRAMINE HCL 25 MG PO CAPS
25.0000 mg | ORAL_CAPSULE | ORAL | Status: AC
  Administered 2017-04-13: 25 mg via ORAL
  Filled 2017-04-13: qty 1

## 2017-04-13 MED ORDER — DIAZEPAM 5 MG PO TABS
10.0000 mg | ORAL_TABLET | ORAL | Status: AC
  Administered 2017-04-13: 10 mg via ORAL
  Filled 2017-04-13: qty 2

## 2017-04-13 MED ORDER — CIPROFLOXACIN HCL 500 MG PO TABS
500.0000 mg | ORAL_TABLET | ORAL | Status: AC
  Administered 2017-04-13: 500 mg via ORAL
  Filled 2017-04-13: qty 1

## 2017-04-13 NOTE — H&P (Signed)
have kidney stones.  HPI: Hannah Nielsen is a 67 year-old female established patient who is here for renal calculi.  The problem is on the left side. She first stated noticing pain on approximately 12/10/2016. This is her first kidney stone. She is not currently having flank pain, back pain, groin pain, nausea, vomiting, fever or chills. She has not caught a stone in her urine strainer since her symptoms began.   She has never had surgical treatment for calculi in the past.   04/06/2017: She has been on MET for the past 6 weeks and has failed to pass her calculus. She denies any flank pain. KUB shows 39m left proximal ureteral calculus     ALLERGIES: Sulfa Drugs    MEDICATIONS: Hydrochlorothiazide 12.5 mg tablet  Myrbetriq 50 mg tablet, extended release 24 hr 1 tablet PO Daily  Tamsulosin Hcl 0.4 mg capsule 1 capsule PO Q HS  Adult Aspirin 81 mg tablet, delayed release  Amlodipine Besylate 5 mg tablet  Atorvastatin Calcium 40 mg tablet  Calcium  Centrum     GU PSH: Cystoscopy - 02/16/2017 Hysterectomy Locm 300-'399Mg'$ /Ml Iodine,1Ml - 01/12/2017    NON-GU PSH: Brain surgery CABG (coronary artery bypass grafting) Heart Surgery (Unspecified)    GU PMH: Gross hematuria - 02/16/2017, - 01/05/2017 Nocturia - 02/16/2017, - 01/05/2017    NON-GU PMH: Cardiac murmur, unspecified Encounter for general adult medical examination without abnormal findings, Encounter for preventive health examination Heart disease, unspecified Hypercholesterolemia Hypertension Sleep Apnea Stroke/TIA    FAMILY HISTORY: 1 Daughter - Daughter 2 sons - Son cerebral aneurysm - Father Death In The Family Father - Father Death In The Family Mother - Mother heart failure - Mother High Blood Pressure - Sister, Mother, Brother   SOCIAL HISTORY: Marital Status: Divorced Preferred Language: English; Ethnicity: Not Hispanic Or Latino; Race: White Current Smoking Status: Patient does not smoke anymore. Has not  smoked since 12/11/2014.   Tobacco Use Assessment Completed: Used Tobacco in last 30 days? Does not use smokeless tobacco. Drinks 1 drink per month. Social Drinker.  Drinks 3 caffeinated drinks per day. 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 Patient denies frequent urination, hard to postpone urination, burning /pain with urination, leakage of urine, stream starts and stops, trouble starting your stream, have to strain to urinate, and being pregnant.  Gastrointestinal (Upper):   Patient denies nausea, vomiting, and indigestion/ heartburn.  Gastrointestinal (Lower):   Patient reports constipation. Patient denies diarrhea.  Constitutional:   Patient denies fever, night sweats, weight loss, and fatigue.  Skin:   Patient denies skin rash/ lesion and itching.  Eyes:   Patient denies blurred vision and double vision.  Ears/ Nose/ Throat:   Patient denies sinus problems and sore throat.  Hematologic/Lymphatic:   Patient denies swollen glands and easy bruising.  Cardiovascular:   Patient denies leg swelling and chest pains.  Respiratory:   Patient denies cough and shortness of breath.  Endocrine:   Patient denies excessive thirst.  Musculoskeletal:   Patient denies back pain and joint pain.  Neurological:   Patient denies headaches and dizziness.  Psychologic:   Patient denies depression and anxiety.   VITAL SIGNS:      04/06/2017 01:44 PM  Weight 195 lb / 88.45 kg  Height 62.5 in / 158.75 cm  BP 144/85 mmHg  Pulse 77 /min  Temperature 97.9 F / 36.6 C  BMI 35.1 kg/m   MULTI-SYSTEM PHYSICAL EXAMINATION:    Constitutional: Well-nourished. No physical deformities. Normally developed. Good grooming.  Neck: Neck symmetrical, not swollen. Normal tracheal position.  Respiratory: No labored breathing, no use of accessory muscles.   Cardiovascular: Normal temperature, normal extremity pulses, no  swelling, no varicosities.  Lymphatic: No enlargement of neck, axillae, groin.  Skin: No paleness, no jaundice, no cyanosis. No lesion, no ulcer, no rash.  Gastrointestinal: No mass, no tenderness, no rigidity, non obese abdomen.  Ears, Nose, Mouth, and Throat: Left ear no scars, no lesions, no masses. Right ear no scars, no lesions, no masses. Nose no scars, no lesions, no masses. Normal hearing. Normal lips.  Musculoskeletal: Normal gait and station of head and neck.     PAST DATA REVIEWED:  Source Of History:  Patient   PROCEDURES:         KUB - [id number]  A single view of the abdomen is obtained.  Bony Abnormalities:  no bony abnormalities  Fecal Stasis:  Mild fecal stasis.  Soft Tissue:  no soft tissue abnormalities  Calculi:  left 80m proximal ureteral calculus               Urinalysis Dipstick Dipstick Cont'd  Color: Yellow Bilirubin: Neg  Appearance: Clear Ketones: Neg  Specific Gravity: 1.020 Blood: Neg  pH: 6.0 Protein: Neg  Glucose: Neg Urobilinogen: 0.2    Nitrites: Neg    Leukocyte Esterase: Neg    ASSESSMENT:      ICD-10 Details  1 GU:   Ureteral calculus - N20.1    PLAN:           Document Letter(s):  Created for Patient: Clinical Summary    We discussed management strategies of ureteral stones including medical expulsive  therapy (MET) (preferred for stones <458mdiameter), ureteroscopic stone manipulation  (URS), and shockwave lithotripsy (SWL) in detail including relative risks / benefits / and  efficacy. We discussed that all patients are candidates for MET as long as can keep  comfortable and hydrated. After consideration of options, the patient has decided to  proceed with ESWL        Notes:   Left ureteral calculus:  -We discussed continued MET versus ESWl versus URS and the patient elects for ESWL

## 2017-04-13 NOTE — Interval H&P Note (Signed)
History and Physical Interval Note:  04/13/2017 8:56 AM  Hannah Nielsen  has presented today for surgery, with the diagnosis of LEFT URETERAL CALCULUS  The various methods of treatment have been discussed with the patient and family. After consideration of risks, benefits and other options for treatment, the patient has consented to  Procedure(s): LEFT EXTRACORPOREAL SHOCK WAVE LITHOTRIPSY (ESWL) (Left) as a surgical intervention .  The patient's history has been reviewed, patient examined, no change in status, stable for surgery.  I have reviewed the patient's chart and labs.  Questions were answered to the patient's satisfaction.     Janifer Gieselman A

## 2017-04-13 NOTE — Discharge Instructions (Signed)
Moderate Conscious Sedation, Adult, Care After °These instructions provide you with information about caring for yourself after your procedure. Your health care provider may also give you more specific instructions. Your treatment has been planned according to current medical practices, but problems sometimes occur. Call your health care provider if you have any problems or questions after your procedure. °What can I expect after the procedure? °After your procedure, it is common: °· To feel sleepy for several hours. °· To feel clumsy and have poor balance for several hours. °· To have poor judgment for several hours. °· To vomit if you eat too soon. ° °Follow these instructions at home: °For at least 24 hours after the procedure: ° °· Do not: °? Participate in activities where you could fall or become injured. °? Drive. °? Use heavy machinery. °? Drink alcohol. °? Take sleeping pills or medicines that cause drowsiness. °? Make important decisions or sign legal documents. °? Take care of children on your own. °· Rest. °Eating and drinking °· Follow the diet recommended by your health care provider. °· If you vomit: °? Drink water, juice, or soup when you can drink without vomiting. °? Make sure you have little or no nausea before eating solid foods. °General instructions °· Have a responsible adult stay with you until you are awake and alert. °· Take over-the-counter and prescription medicines only as told by your health care provider. °· If you smoke, do not smoke without supervision. °· Keep all follow-up visits as told by your health care provider. This is important. °Contact a health care provider if: °· You keep feeling nauseous or you keep vomiting. °· You feel light-headed. °· You develop a rash. °· You have a fever. °Get help right away if: °· You have trouble breathing. °This information is not intended to replace advice given to you by your health care provider. Make sure you discuss any questions you have  with your health care provider. °Document Released: 01/16/2013 Document Revised: 08/31/2015 Document Reviewed: 07/18/2015 °Elsevier Interactive Patient Education © 2018 Else[employer] °Lithotripsy, Care After °This sheet gives you information about how to care for yourself after your procedure. Your health care provider may also give you more specific instructions. If you have problems or questions, contact your health care provider. °What can I expect after the procedure? °After the procedure, it is common to have: °· Some blood in your urine. This should only last for a few days. °· Soreness in your back, sides, or upper abdomen for a few days. °· Blotches or bruises on your back where the pressure wave entered the skin. °· Pain, discomfort, or nausea when pieces (fragments) of the kidney stone move through the tube that carries urine from the kidney to the bladder (ureter). Stone fragments may pass soon after the procedure, but they may continue to pass for up to 4-8 weeks. °? If you have severe pain or nausea, contact your health care provider. This may be caused by a large stone that was not broken up, and this may mean that you need more treatment. °· Some pain or discomfort during urination. °· Some pain or discomfort in the lower abdomen or (in men) at the base of the penis. ° °Follow these instructions at home: °Medicines °· Take over-the-counter and prescription medicines only as told by your health care provider. °· If you were prescribed an antibiotic medicine, take it as told by your health care provider. Do not stop taking the antibiotic even if you   start to feel better.  Do not drive for 24 hours if you were given a medicine to help you relax (sedative).  Do not drive or use heavy machinery while taking prescription pain medicine. Eating and drinking  Drink enough water and fluids to keep your urine clear or pale yellow. This helps any remaining pieces of the stone to pass. It can also help  prevent new stones from forming.  Eat plenty of fresh fruits and vegetables.  Follow instructions from your health care provider about eating and drinking restrictions. You may be instructed: ? To reduce how much salt (sodium) you eat or drink. Check ingredients and nutrition facts on packaged foods and beverages. ? To reduce how much meat you eat.  Eat the recommended amount of calcium for your age and gender. Ask your health care provider how much calcium you should have. General instructions  Get plenty of rest.  Most people can resume normal activities 1-2 days after the procedure. Ask your health care provider what activities are safe for you.  If directed, strain all urine through the strainer that was provided by your health care provider. ? Keep all fragments for your health care provider to see. Any stones that are found may be sent to a medical lab for examination. The stone may be as small as a grain of salt.  Keep all follow-up visits as told by your health care provider. This is important. Contact a health care provider if:  You have pain that is severe or does not get better with medicine.  You have nausea that is severe or does not go away.  You have blood in your urine longer than your health care provider told you to expect.  You have more blood in your urine.  You have pain during urination that does not go away.  You urinate more frequently than usual and this does not go away.  You develop a rash or any other possible signs of an allergic reaction. Get help right away if:  You have severe pain in your back, sides, or upper abdomen.  You have severe pain while urinating.  Your urine is very dark red.  You have blood in your stool (feces).  You cannot pass any urine at all.  You feel a strong urge to urinate after emptying your bladder.  You have a fever or chills.  You develop shortness of breath, difficulty breathing, or chest pain.  You have  severe nausea that leads to persistent vomiting.  You faint. Summary  After this procedure, it is common to have some pain, discomfort, or nausea when pieces (fragments) of the kidney stone move through the tube that carries urine from the kidney to the bladder (ureter). If this pain or nausea is severe, however, you should contact your health care provider.  Most people can resume normal activities 1-2 days after the procedure. Ask your health care provider what activities are safe for you.  Drink enough water and fluids to keep your urine clear or pale yellow. This helps any remaining pieces of the stone to pass, and it can help prevent new stones from forming.  If directed, strain your urine and keep all fragments for your health care provider to see. Fragments or stones may be as small as a grain of salt.  Get help right away if you have severe pain in your back, sides, or upper abdomen or have severe pain while urinating. This information is not intended to replace advice  given to you by your health care provider. Make sure you discuss any questions you have with your health care provider. Document Released: 04/17/2007 Document Revised: 02/17/2016 Document Reviewed: 02/17/2016 Elsevier Interactive Patient Education  2018 [place], cuidados aps o procedimento Lithotripsy, Care After [employer] oferece informaes sobre como cuidar de si aps o seu procedimento. Seu mdico tambm poder fornecer instrues mais especficas. Caso tenha problemas ou perguntas, entre em contato com o seu mdico. O que posso esperar aps o procedimento? Aps seu procedimento,  comum que ocorram:  Algum sangue na urina. Isso deve durar somente alguns dias.  Dor nas costas, flancos ou parte superior do abdome por alguns dias.  Manchas ou hematomas nas costas, onde a onda de presso entra na pele.  Dor, desconforto ou enjoo quando pedaos (fragmentos) do clculo renal passam pelo tubo  que transporta a urina do rim para a bexiga (ureter). Fragmentos de clculos podem ser [employer] aps o procedimento, e isso pode durar por 4-8 semanas. ? Caso sinta dores ou enjoo intenso, entre em contato com o seu mdico. Isso pode ser causado por um clculo grande que no foi fragmentado e pode significar que voc precisa de mais tratamento.  Alguma dor ou desconforto ao urinar.  Alguma dor ou desconforto na parte inferior do abdome ou (em homens) na base do pnis.  Siga essas instrues em casa: Medicamentos  Tome medicamentos vendidos com ou sem receita mdica somente de acordo com as indicaes do seu mdico.  Caso tenha recebido uma prescrio de antibitico, tome-o somente como determinado pelo seu mdico. No pare de tomar o antibitico mesmo se comear a se Warden/ranger.  No dirija por 24 horas caso tenha recebido um medicamento para ajudar voc a relaxar (sedativo).  No dirija nem opere mquinas pesadas enquanto estiver tomando analgsicos vendidos com receita mdica. Alimentos e bebidas  Beba bastante gua e lquidos para manter a urina clara ou amarelo plida. Isso ajudar na eliminao de quaisquer pedaos remanescentes de clculos. Isso tambm poder ajudar a prevenir a formao de novos clculos.  Coma grande quantidade de frutas e verduras.  Siga as instrues do seu mdico no que se refere a restries de [facility]. Voc pode ser orientado a: ? Reduzir a quantidade de sal (sdio) no que come ou bebe. Verificar os ingredientes e os fatos nutricionais de alimentos empacotados e bebidas. ? Reduzir a quantidade comida ou bebida.  Ingerir a quantidade de clcio recomendada para a sua idade e sexo. Pergunte ao seu mdico quanto clcio voc deve consumir. Instrues gerais  Repouse bastante.  A maioria das pessoas pode retomar as atividades 1-2 dias aps o procedimento. Pergunte ao seu mdico quais atividades so seguras para voc.  Se orientado a  faz-lo, passe a urina por um filtro fornecido pelo seu mdico. ? Guarde todos os fragmentos de clculos para o seu mdico examinar. Todos os clculos encontrados devem ser enviados para um laboratrio mdico para anlise. O clculo poder ser pequeno como um gro de sal.  Comparea a todas as consultas de acompanhamento de acordo com as orientaes do seu mdico. Isso  importante. Entre em contato com um mdico se:  Sentir dor intensa ou que no melhora com medicamentos.  Ocorrer enjoo severo ou incessante.  Surgir sangue na urina por mais tempo do que o mdico disse para IT trainer.  Surgir mais sangue na sua urina.  Sentir dor ao urinar e ela no melhorar.  Urinar com maior frequncia do que o  normal e isso no passar.  Surgir uma erupo cutnea e outros possveis sinais de Music therapist. [place] ajuda imediatamente se:  Sentir dor intensa nas costas, flancos ou parte superior do abdome.  Sentir dor Solicitor.  Sua urina ficar vermelha escura.  Notar sangue nos seus excrementos (fezes).  No conseguir urinar.  Sentir uma forte vontade de urinar aps esvaziar a bexiga.  Tiver febre ou calafrios.  Sentir falta de ar, dificuldade ao respirar ou dor no peito.  Sentir enjoo severo causador de vmito persistente.  Desmaiar. Resumo  Aps este procedimento,  comum sentir alguma dor, desconforto ou enjoo quando pedaos (fragmentos) do clculo renal passam pelo tubo que transporta a urina do rim para a bexiga (ureter). Caso essa dor ou enjoo sejam intensos, no entanto, entre em contato com o seu mdico.  A maioria das pessoas pode retomar as atividades 1-2 dias aps o procedimento. Pergunte ao seu mdico quais atividades so seguras para voc.  Beba bastante gua e lquidos para manter a urina clara ou amarelo plida. Isso ajudar na eliminao dos pedaos remanescentes de clculo e poder ajudar a prevenir a formao de novos clculos.  Se orientado a faz-lo,  filtre a urina e guarde todos os fragmentos para o seu mdico ver. Os fragmentos ou clculos podem ser pequenos como um gro de sal.  Obtenha ajuda imediatamente se sentir dor severa no pescoo, flancos ou parte superior do abdome ou dor severa ao urinar. Estas informaes no se destinam a substituir as recomendaes de seu mdico. No deixe de discutir quaisquer dvidas com seu mdico. Document Released: 04/24/2015 Document Revised: 08/01/2016 Document Reviewed: 08/01/2016 Elsevier Interactive Patient Education  [employer]. I have reviewed discharge instructions in detail with the patient. They will follow-up with me or their physician as scheduled. My nurse will also be calling the patients as per protocol.

## 2017-04-14 ENCOUNTER — Encounter (HOSPITAL_COMMUNITY): Payer: Self-pay | Admitting: Urology

## 2017-04-24 ENCOUNTER — Ambulatory Visit: Payer: Medicare Other | Admitting: Neurology

## 2017-04-24 ENCOUNTER — Encounter: Payer: Self-pay | Admitting: Neurology

## 2017-04-24 VITALS — BP 122/78 | HR 74 | Wt 200.8 lb

## 2017-04-24 DIAGNOSIS — I6990 Unspecified sequelae of unspecified cerebrovascular disease: Secondary | ICD-10-CM

## 2017-04-24 NOTE — Patient Instructions (Signed)
I had a long discussion with the patient regarding her remote stroke, anterior cerebral artery aneurysm status post endovascular coiling, stroke risk factors modification and answered questions. Continue aspirin for stroke prevention and strict control of hypertension with blood pressure goal below 130/90, lipids with LDL cholesterol goal below 70 mg percent and smoking cessation. I also encouraged her to eat a healthy diet with lots of fruits, vegetables, whole grains and cereals and to be active. Since patient is doing well and has been stroke free for nearly 3 years I do not recommend a routine scheduled follow-up appointment with me. She was advised to follow-up with her primary care physician but may be referred back in the future if needed.

## 2017-04-24 NOTE — Progress Notes (Signed)
PATIENT: Hannah Nielsen DOB: 1950/11/22  REASON FOR VISIT: follow up- stroke HISTORY FROM: patient  HISTORY OF PRESENT ILLNESS: Hannah Nielsen is a 67 year old female with a history of stroke. She returns today for follow-up. She remains on aspirin for stroke prevention. Reports that her blood pressure has been in normal range. She has regular follow-ups with her primary care. Reports that her cholesterol has been in normal range. She is on Lipitor. She is not diabetic. The patient reports she has stopped smoking. She did have a sleep study and was diagnosed with moderate sleep apnea. She has been using the CPAP with good compliance in the past however she reports that in the last month she is become "lazy." And states that she occasionally takes it off during the night. Her CPAP download indicates that she didn't use her machine 26 out of 30 days but only uses her machine greater than 4 hours 9 of those days. Patient also reports that she has numbness and tingling in the left hand in the last 2 digits. In the past she has been told this is due to ulnar nerve compression. She states that it only happens occasionally. Is not accompanied with any other symptoms. She returns today for an evaluation.  HISTORY 10/07/14: Hannah Nielsen is a 67 year old female with a history of CVA on 07/01/2014. The patient presented to the ED with left hand numbness. She went to bed that night and woke up around 2 AM and felt that her left hand was numb and clumsy. She drove herself to the emergency room. The MRI indicated that she had a precentral cortex infarct. The patient was out of the window for TPA. A TEE was completed and an intracardiac mass as well as a probable PFO was discovered. Later in her hospital visit she had surgery to remove the mass and close the PFO. Patient also had carotid Dopplers that indicated bilateral ECAs with increased velocity consistent with severe stenosis. Her hemoglobin A1c was 6.3. Patient's LDL was  112. Also while in the hospital they found a right ACA Aneurysm 4.4 mm. The patient has a family history of ruptured aneurysms and therefore was referred to Dr. Ralene Ok for follow-up. The patient was discharged on Lipitor for her cholesterol. She was discharged with aspirin 325 mg 4 stroke prevention. The patient returns today for evaluation. She continues on aspirin for secondary stroke prevention. She is also on Lipitor for her cholesterol. Her latest cholesterol numbers were excellent. Patient states that her blood pressure has been controlled. She is currently taking metoprolol and Norvasc. The patient never received a referral to Dr.Nunkumar in regards to her aneurysm. She states that this is the most concerning thing for her. She states that her father died of a ruptured aneurysm and of course she wants to try to prevent that from happening to her. She states that since this stroke most of her symptoms have resolved. She does have a patch of numbness on the left side of the chest and the left thigh. She states that the numbness in her hands have improved but she still has a tingling feeling in the ring and pinky finger. Patient also reports that she does snore at night. She does not feel that she sleeps well during the night. She gets up 3-4 times to urinate. She does report daytime sleepiness. Her Epworth sleepiness scale is 15 and fatigue severity scale is 44. She states that her sister has been diagnosed with sleep apnea. She is concerned  that this may be the same for her. She denies any additional stroke like symptoms. She no longer smokes cigarettes. She is currently participating  in cardiac rehabilitation. She returns today for an evaluation. Update 04/24/2017 : She returns for follow-up after last visit the year ago. She continues to do well without recurrent stroke or TIA symptoms. She states she made full recovery and has noticeable left-sided numbness. She had recent lithotripsy done and blood  pressure medications and aspirin are currently on hold but she plans to start them back next week. She is tolerating Lipitor well without muscle aches and pains but states she cannot come in when she had her last lipid profile checked but she does have a and his physical exam with her primary physician next month. She had a right anterior cerebral artery aneurysm coiled by Dr.Nundkumar in 2016. She's had no recurrent stroke symptoms since her  infarct in March 2016. She has no new complaints today. REVIEW OF SYSTEMS: Out of a complete 14 system review of symptoms, the patient complains only of the following symptoms, and all other reviewed systems are negative.  Constipation, apnea, and all other systems negative  ALLERGIES: Allergies  Allergen Reactions  . Sulfa Antibiotics     Childhood allergy - unknown    HOME MEDICATIONS: Outpatient Medications Prior to Visit  Medication Sig Dispense Refill  . amLODipine (NORVASC) 5 MG tablet TAKE 1 TABLET(5 MG) BY MOUTH DAILY 90 tablet 2  . atorvastatin (LIPITOR) 40 MG tablet Take 40 mg by mouth daily at 6 PM.    . hydrochlorothiazide (MICROZIDE) 12.5 MG capsule TAKE 1 CAPSULE(12.5 MG) BY MOUTH DAILY 90 capsule 2  . mirabegron ER (MYRBETRIQ) 50 MG TB24 tablet Take 50 mg by mouth daily.    . nicotine polacrilex (COMMIT) 2 MG lozenge Take 2 mg by mouth as needed for smoking cessation.    . polyethylene glycol (MIRALAX) packet Take 17 g by mouth daily.    . tamsulosin (FLOMAX) 0.4 MG CAPS capsule TK ONE C PO QHS  1  . ASPIRIN 81 PO Take by mouth.    . calcium carbonate (OS-CAL) 600 MG TABS tablet Take 600 mg by mouth daily.    . furosemide (LASIX) 40 MG tablet Take half (1/2) to one (1) tablet (20 mg to 40 mg total) by mouth as needed for fluid.    Marland Kitchen HYDROcodone-acetaminophen (NORCO/VICODIN) 5-325 MG tablet TK 1-2 TS PO Q 6 H PRN  0  . ibuprofen (ADVIL,MOTRIN) 800 MG tablet TK 1 T PO Q 6 TO 8 H PRN  1  . KRILL OIL PO Take 1 tablet by mouth daily.    .  LUTEIN PO Take 1 tablet by mouth daily.    . Multiple Vitamins-Minerals (MULTIVITAL) tablet Take 1 tablet by mouth daily.    . nicotine polacrilex (COMMIT) 2 MG lozenge Take 2 mg by mouth as needed for smoking cessation.    . ondansetron (ZOFRAN) 4 MG tablet TK 1 T PO Q 8 H PRN  0  . oxybutynin (DITROPAN-XL) 10 MG 24 hr tablet Take 10 mg by mouth at bedtime.    . polyethylene glycol (MIRALAX / GLYCOLAX) packet Take 17 g by mouth daily as needed for mild constipation.    . tamsulosin (FLOMAX) 0.4 MG CAPS capsule Take 0.4 mg by mouth at bedtime.    . triamcinolone acetonide (KENALOG) 10 MG/ML injection 10 mg      No facility-administered medications prior to visit.  PAST MEDICAL HISTORY: Past Medical History:  Diagnosis Date  . Aneurysm of anterior cerebral artery: Right per MRI 07/01/14 07/02/2014   4.4 mm  . CAD (coronary artery disease), native coronary artery: Severe 2 vessel dz per cardiac cath 07/04/14 07/04/2014  . Cataract    left immature  . Constipation    takes Miralax daily as needed  . GERD (gastroesophageal reflux disease)    rarely takes Tums  . History of colon polyps    benign  . History of kidney stones   . Hyperlipidemia 07/02/2014   takes Lipitor daily  . Hypertension    takes Amlodipine and HCTZ daily  . Insomnia   . Left atrial mass 07/03/2014   benign papillary fibroelastoma  . Nocturia    takes Ditropan nightly  . Papillary fibroelastoma of heart   . S/P resection of left atrial benign papillary fibroelastoma and CABG x 2 07/10/2014   LIMA to LAD, SVG to RCA, EVH via right thigh  . Shortness of breath dyspnea    with exertion   . Sleep apnea    uses cpap  . Stroke ([place]) 0/27/7412   acute embolic stroke to right MCA territory  . Tingling    left hand,left breast,and left hip  . Urinary frequency   . Urinary urgency     PAST SURGICAL HISTORY: Past Surgical History:  Procedure Laterality Date  . ABDOMINAL HYSTERECTOMY    . COLONOSCOPY    .  CORONARY ARTERY BYPASS GRAFT N/A 07/10/2014   Procedure: CORONARY ARTERY BYPASS GRAFTING (CABG), ON PUMP, TIMES TWO, USING LEFT INTERNAL MAMMARY ARTERY, RIGHT GREATER SAPHENOUS VEIN HARVESTED ENDOSCOPICALLY;  Surgeon: Rexene Alberts, MD;  Location: [place];  Service: Open Heart Surgery;  Laterality: N/A;  . EXCISION OF ATRIAL MYXOMA N/A 07/10/2014   Procedure: RESECTION OF LEFT ATRIAL MASS;  Surgeon: Rexene Alberts, MD;  Location: [place];  Service: Open Heart Surgery;  Laterality: N/A;  . EXTRACORPOREAL SHOCK WAVE LITHOTRIPSY Left 04/13/2017   Procedure: LEFT EXTRACORPOREAL SHOCK WAVE LITHOTRIPSY (ESWL);  Surgeon: Bjorn Loser, MD;  [facility];  Service: Urology;  Laterality: Left;  . LEFT HEART CATHETERIZATION WITH CORONARY ANGIOGRAM N/A 07/04/2014   Procedure: LEFT HEART CATHETERIZATION WITH CORONARY ANGIOGRAM;  Surgeon: Jettie Booze, MD;  Location: [facility];  Service: Cardiovascular;  Laterality: N/A;  . RADIOLOGY WITH ANESTHESIA N/A 12/23/2014   Procedure: Stent supported coil embolization OR right ACA aneurysm;  Surgeon: Consuella Lose, MD;  Location: [place] [facility];  Service: Radiology;  Laterality: N/A;  . REPAIR OF PATENT FORAMEN OVALE N/A 07/10/2014   Procedure: REPAIR OF PATENT FORAMEN OVALE;  Surgeon: Rexene Alberts, MD;  Location: [place];  Service: Open Heart Surgery;  Laterality: N/A;  . TEE WITHOUT CARDIOVERSION N/A 07/03/2014   Procedure: TRANSESOPHAGEAL ECHOCARDIOGRAM (TEE);  Surgeon: Jerline Pain, MD;  Location: [place];  Service: Cardiovascular;  Laterality: N/A;  . TEE WITHOUT CARDIOVERSION N/A 07/10/2014   Procedure: TRANSESOPHAGEAL ECHOCARDIOGRAM (TEE);  Surgeon: Rexene Alberts, MD;  Location: [place];  Service: Open Heart Surgery;  Laterality: N/A;    FAMILY HISTORY: Family History  Problem Relation Age of Onset  . Hypertension Mother   . Heart failure Mother        Deceased  . Heart attack Mother   . Cerebral aneurysm Father 84       Deceased  .  Aneurysm Father   . Breast cancer Maternal Grandmother   . Heart attack Maternal Grandfather   .  Hypertension Sister   . Hypertension Brother   . Heart attack Daughter   . Stroke Neg Hx     SOCIAL HISTORY: Social History   Socioeconomic History  . Marital status: Divorced    Spouse name: Not on file  . Number of children: 3  . Years of education: college  . Highest education level: Not on file  Social Needs  . Financial resource strain: Not on file  . Food insecurity - worry: Not on file  . Food insecurity - inability: Not on file  . Transportation needs - medical: Not on file  . Transportation needs - non-medical: Not on file  Occupational History    Comment: retired Pharmacist, hospital  Tobacco Use  . Smoking status: Former Smoker    Last attempt to quit: 07/01/2014    Years since quitting: 2.8  . Smokeless tobacco: Never Used  . Tobacco comment: Uses nicotiene losenges  Substance and Sexual Activity  . Alcohol use: Yes    Alcohol/week: 0.6 oz    Types: 1 Cans of beer per week    Comment: rarely  . Drug use: No  . Sexual activity: Not on file  Other Topics Concern  . Not on file  Social History Narrative   Patient lives at home alone and she is divorced.   Retired Pharmacist, hospital.   Education college.   Right handed.   Caffeine coffee two cups.      PHYSICAL EXAM  Vitals:   04/24/17 1145  BP: 122/78  Pulse: 74  Weight: 200 lb 12.8 oz (91.1 kg)   Body mass index is 36.14 kg/m.  Generalized: Well developed, in no acute distress   Neurological examination  Mentation: Alert oriented to time, place, history taking. Follows all commands speech and language fluent Cranial nerve II-XII: Pupils were equal round reactive to light. Extraocular movements were full, visual field were full on confrontational test. Facial sensation and strength were normal. Uvula tongue midline. Head turning and shoulder shrug  were normal and symmetric. Motor: The motor testing reveals 5 over 5  strength of all 4 extremities. Good symmetric motor tone is noted throughout.  Sensory: Sensory testing is intact to soft touch on all 4 extremities. No evidence of extinction is noted.  Coordination: Cerebellar testing reveals good finger-nose-finger and heel-to-shin bilaterally.  Gait and station: Gait is normal. Tandem gait is normal. Romberg is negative. No drift is seen.  Reflexes: Deep tendon reflexes are symmetric and normal bilaterally.   DIAGNOSTIC DATA (LABS, IMAGING, TESTING) - I reviewed patient records, labs, notes, testing and imaging myself where available.  Lab Results  Component Value Date   WBC 10.2 01/04/2016   HGB 15.1 01/04/2016   HCT 44.1 01/04/2016   MCV 87.8 01/04/2016   PLT 333 01/04/2016      Component Value Date/Time   NA 140 01/04/2016 1004   K 4.3 01/04/2016 1004   CL 101 01/04/2016 1004   CO2 29 01/04/2016 1004   GLUCOSE 95 01/04/2016 1004   BUN 16 01/04/2016 1004   CREATININE 0.79 01/04/2016 1004   CALCIUM 9.9 01/04/2016 1004   PROT 7.0 09/22/2014 1035   ALBUMIN 3.9 09/22/2014 1035   AST 24 09/22/2014 1035   ALT 37 (H) 09/22/2014 1035   ALKPHOS 118 (H) 09/22/2014 1035   BILITOT 0.4 09/22/2014 1035   GFRNONAA >60 07/28/2015 0620   GFRAA >60 07/28/2015 0620   Lab Results  Component Value Date   CHOL 128 01/04/2016   HDL 51 01/04/2016  [place] 51 01/04/2016   TRIG 129 01/04/2016   CHOLHDL 2.5 01/04/2016   Lab Results  Component Value Date   HGBA1C 6.1 (H) 07/08/2014     ASSESSMENT AND PLAN 67 y.o. year old female  has a past medical history of  hypertension, ACA aneurysm (s/p elective stent-supported coil embolization under Dr. [name] on 12/23/14), coronary artery disease (s/p 2 vessel CABG on 07/10/2014), papillary fibroblastoma of the heart, stroke in March 2016, hyperlipidemia, smoking cessation in March 2016, and obesity, Sleep apnea,  here with:  1. History of stroke 2. Obstructive sleep apnea on CPAP 3. Numbness and tingling  left hand  I had a long discussion with the patient regarding her remote stroke, anterior cerebral artery aneurysm status post endovascular coiling, stroke risk factors modification and answered questions. Continue aspirin for stroke prevention and strict control of hypertension with blood pressure goal below 130/90, lipids with LDL cholesterol goal below 70 mg percent and smoking cessation. I also encouraged her to eat a healthy diet with lots of fruits, vegetables, whole grains and cereals and to be active. Since patient is doing well and has been stroke free for nearly 3 years I do not recommend a routine scheduled follow-up appointment with me. She was advised to follow-up with her primary care physician but may be referred back in the future if needed. She may follow-up with Dr. Rexene Alberts for her sleep apnea in the future as needed. Greater than 50% time during this 25 minute visit was spent on counseling and coordination of care about her remote stroke, discussion about stroke risk factors, aneurysm coiling and answering questions     Antony Contras, MD 04/24/2017, 12:55 PM [facility] [address] [place], [place] [address] [phone]  I reviewed the above note and documentation by the Nurse Practitioner and agree with the history, physical exam, assessment and plan as outlined above. I was immediately available for face-to-face consultation. Star Age, MD, PhD [facility])

## 2017-04-26 ENCOUNTER — Other Ambulatory Visit: Payer: Self-pay | Admitting: Geriatric Medicine

## 2017-04-26 DIAGNOSIS — R911 Solitary pulmonary nodule: Secondary | ICD-10-CM

## 2017-04-28 ENCOUNTER — Other Ambulatory Visit: Payer: Medicare Other

## 2017-05-02 ENCOUNTER — Ambulatory Visit
Admission: RE | Admit: 2017-05-02 | Discharge: 2017-05-02 | Disposition: A | Discharge status: Home / Self Care | Payer: Medicare Other | Source: Ambulatory Visit | Attending: Geriatric Medicine | Admitting: Geriatric Medicine

## 2017-05-02 ENCOUNTER — Other Ambulatory Visit: Payer: Self-pay | Admitting: Geriatric Medicine

## 2017-05-02 DIAGNOSIS — R918 Other nonspecific abnormal finding of lung field: Secondary | ICD-10-CM

## 2017-05-02 DIAGNOSIS — R911 Solitary pulmonary nodule: Secondary | ICD-10-CM

## 2017-06-10 ENCOUNTER — Other Ambulatory Visit: Payer: Self-pay | Admitting: Internal Medicine

## 2017-08-07 ENCOUNTER — Encounter: Payer: Self-pay | Admitting: Internal Medicine

## 2017-08-07 ENCOUNTER — Ambulatory Visit: Payer: Medicare Other | Admitting: Internal Medicine

## 2017-08-07 VITALS — BP 128/82 | HR 76 | Ht 62.5 in | Wt 204.0 lb

## 2017-08-07 DIAGNOSIS — I2510 Atherosclerotic heart disease of native coronary artery without angina pectoris: Secondary | ICD-10-CM | POA: Diagnosis not present

## 2017-08-07 DIAGNOSIS — R0609 Other forms of dyspnea: Secondary | ICD-10-CM

## 2017-08-07 DIAGNOSIS — E782 Mixed hyperlipidemia: Secondary | ICD-10-CM | POA: Diagnosis not present

## 2017-08-07 DIAGNOSIS — I10 Essential (primary) hypertension: Secondary | ICD-10-CM

## 2017-08-07 MED ORDER — FUROSEMIDE 40 MG PO TABS: ORAL_TABLET | ORAL | 3 refills | Status: DC

## 2017-08-07 NOTE — Patient Instructions (Signed)
Your physician recommends that you continue on your current medications as directed. Please refer to the Current Medication list given to you today. Your physician wants you to follow-up in: 1 year with Dr. Ross.  You will receive a reminder letter in the mail two months in advance. If you don't receive a letter, please call our office to schedule the follow-up appointment.  

## 2017-08-07 NOTE — Progress Notes (Signed)
Cardiology Office Note   Date:  08/07/2017   ID:  Hannah Nielsen, DOB February 27, 1951, MRN [id number]  PCP:  Hannah Manes, MD  Cardiologist:   Hannah Carnes, MD   F/U of CAD    History of Present Illness: Hannah Nielsen is a 67 y.o. female with a history of HTN, tobacco abuse and CAD. 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 She underwent removal of fibroelastoma and CABG x 2 (LIMA TO LAD and SVG to RCA). In addition underwent PFO closure. This was in 2016  I saw her in July 2018   Since seen  She has done OK   She denies CP  Breathing she says is OK unless she goes up stairs She helps take care of 2 grandchldren  6 and 8    Admits to needs to walk more Has CPAP   Uses some    Outpatient Medications Prior to Visit  Medication Sig Dispense Refill  . amLODipine (NORVASC) 5 MG tablet TAKE 1 TABLET(5 MG) BY MOUTH DAILY 90 tablet 2  . ASPIRIN 81 PO Take by mouth.    Marland Kitchen atorvastatin (LIPITOR) 40 MG tablet Take 40 mg by mouth daily at 6 PM.    . hydrochlorothiazide (MICROZIDE) 12.5 MG capsule TAKE 1 CAPSULE(12.5 MG) BY MOUTH DAILY 90 capsule 2  . nicotine polacrilex (COMMIT) 2 MG lozenge Take 2 mg by mouth as needed for smoking cessation.    Marland Kitchen oxybutynin (DITROPAN-XL) 10 MG 24 hr tablet Take 10 mg by mouth at bedtime.    . polyethylene glycol (MIRALAX) packet Take 17 g by mouth daily.    . mirabegron ER (MYRBETRIQ) 50 MG TB24 tablet Take 50 mg by mouth daily.    . tamsulosin (FLOMAX) 0.4 MG CAPS capsule TK ONE C PO QHS  1   No facility-administered medications prior to visit.      Allergies:   Sulfa antibiotics   Past Medical History:  Diagnosis Date  . Aneurysm of anterior cerebral artery: Right per MRI 07/01/14 07/02/2014   4.4 mm  . CAD (coronary artery disease), native coronary artery: Severe 2 vessel dz per cardiac cath 07/04/14 07/04/2014  . Cataract    left immature  . Constipation    takes Miralax daily as needed   . GERD (gastroesophageal reflux disease)    rarely takes Tums  . History of colon polyps    benign  . History of kidney stones   . Hyperlipidemia 07/02/2014   takes Lipitor daily  . Hypertension    takes Amlodipine and HCTZ daily  . Insomnia   . Left atrial mass 07/03/2014   benign papillary fibroelastoma  . Nocturia    takes Ditropan nightly  . Papillary fibroelastoma of heart   . S/P resection of left atrial benign papillary fibroelastoma and CABG x 2 07/10/2014   LIMA to LAD, SVG to RCA, EVH via right thigh  . Shortness of breath dyspnea    with exertion   . Sleep apnea    uses cpap  . Stroke ([place]) 4/31/5400   acute embolic stroke to right MCA territory  . Tingling    left hand,left breast,and left hip  . Urinary frequency   . Urinary urgency     Past Surgical History:  Procedure Laterality Date  . ABDOMINAL HYSTERECTOMY    . COLONOSCOPY    . CORONARY ARTERY BYPASS GRAFT N/A 07/10/2014   Procedure: CORONARY ARTERY BYPASS GRAFTING (  CABG), ON PUMP, TIMES TWO, USING LEFT INTERNAL MAMMARY ARTERY, RIGHT GREATER SAPHENOUS VEIN HARVESTED ENDOSCOPICALLY;  Surgeon: Hannah Alberts, MD;  Location: [place];  Service: Open Heart Surgery;  Laterality: N/A;  . EXCISION OF ATRIAL MYXOMA N/A 07/10/2014   Procedure: RESECTION OF LEFT ATRIAL MASS;  Surgeon: Hannah Alberts, MD;  Location: [place];  Service: Open Heart Surgery;  Laterality: N/A;  . EXTRACORPOREAL SHOCK WAVE LITHOTRIPSY Left 04/13/2017   Procedure: LEFT EXTRACORPOREAL SHOCK WAVE LITHOTRIPSY (ESWL);  Surgeon: Hannah Loser, MD;  [facility];  Service: Urology;  Laterality: Left;  . LEFT HEART CATHETERIZATION WITH CORONARY ANGIOGRAM N/A 07/04/2014   Procedure: LEFT HEART CATHETERIZATION WITH CORONARY ANGIOGRAM;  Surgeon: Jettie Booze, MD;  Location: [facility] CATH LAB;  Service: Cardiovascular;  Laterality: N/A;  . RADIOLOGY WITH ANESTHESIA N/A 12/23/2014   Procedure: Stent supported coil embolization OR right ACA aneurysm;   Surgeon: Hannah Lose, MD;  Location: [place] [facility];  Service: Radiology;  Laterality: N/A;  . REPAIR OF PATENT FORAMEN OVALE N/A 07/10/2014   Procedure: REPAIR OF PATENT FORAMEN OVALE;  Surgeon: Hannah Alberts, MD;  Location: [place];  Service: Open Heart Surgery;  Laterality: N/A;  . TEE WITHOUT CARDIOVERSION N/A 07/03/2014   Procedure: TRANSESOPHAGEAL ECHOCARDIOGRAM (TEE);  Surgeon: Hannah Pain, MD;  Location: [place];  Service: Cardiovascular;  Laterality: N/A;  . TEE WITHOUT CARDIOVERSION N/A 07/10/2014   Procedure: TRANSESOPHAGEAL ECHOCARDIOGRAM (TEE);  Surgeon: Hannah Alberts, MD;  Location: [place];  Service: Open Heart Surgery;  Laterality: N/A;     Social History:  The patient  reports that she quit smoking about 3 years ago. She has never used smokeless tobacco. She reports that she drinks about 0.6 oz of alcohol per week. She reports that she does not use drugs.   Family History:  The patient's family history includes Aneurysm in her father; Breast cancer in her maternal grandmother; Cerebral aneurysm (age of onset: 60) in her father; Heart attack in her daughter, maternal grandfather, and mother; Heart failure in her mother; Hypertension in her brother, mother, and sister.    ROS:  Please see the history of present illness. All other systems are reviewed and  Negative to the above problem except as noted.    PHYSICAL EXAM: VS:  BP 128/82   Pulse 76   Ht 5' 2.5" (1.588 m)   Wt 204 lb (92.5 kg)   SpO2 92%   BMI 36.72 kg/m   GEN: Morbidly obese 67 yo  in no acute distress  HEENT: normal  Neck: JVP normal  No carotid bruits, or masses Cardiac: RRR; no murmurs, rubs, or gallops,no edema  Respiratory:  clear to auscultation bilaterally, normal work of breathing GI: soft, nontender, nondistended, + BS  No hepatomegaly  MS: no deformity Moving all extremities   Skin: warm and dry, no rash Neuro:  Strength and sensation are intact Psych: euthymic mood, full  affect   EKG:  EKG is  Not ordered today.    Lipid Panel    Component Value Date/Time   CHOL 128 01/04/2016 1004   TRIG 129 01/04/2016 1004   HDL 51 01/04/2016 1004   CHOLHDL 2.5 01/04/2016 1004   VLDL 26 01/04/2016 1004   LDLCALC 51 01/04/2016 1004      Wt Readings from Last 3 Encounters:  08/07/17 204 lb (92.5 kg)  04/24/17 200 lb 12.8 oz (91.1 kg)  04/07/17 195 lb (88.5 kg)      ASSESSMENT AND  PLAN:  1  CAD  No symptoms of angina Continue to follow  2.  HTN  BP is OK   HI have given her Rx for lasix 40 to take as needed (she takes 20) for flud    3  HL Excellent control of LDL   LDL was 51 in July 2018    4  Hx CVA  S/p Removal of fibroelastoma  5  Pulm   O2 goes down to 90% with walking   Back up to 96 at rest   Long history of smoking   Does not  Any longer   No wheezing   Follow    5   Obesity     Encouraged increased activity  Work on diet   WOuld help with breathing and OSA   F/U next spring  m \    Current medicines are reviewed at length with the patient today.  The patient does not have concerns regarding medicines.   Signed, Hannah Carnes, MD  08/07/2017 8:20 AM    [facility] [place], [place], [place]  [address] Phone: [phone]; Fax: [phone]

## 2017-10-01 ENCOUNTER — Other Ambulatory Visit: Payer: Self-pay | Admitting: Internal Medicine

## 2017-10-31 ENCOUNTER — Other Ambulatory Visit: Payer: Self-pay | Admitting: Internal Medicine

## 2017-11-06 ENCOUNTER — Ambulatory Visit: Payer: Medicare Other | Admitting: Neurology

## 2017-11-06 ENCOUNTER — Encounter: Payer: Self-pay | Admitting: Neurology

## 2017-11-06 VITALS — BP 144/93 | HR 79 | Ht 62.5 in | Wt 210.0 lb

## 2017-11-06 DIAGNOSIS — G4733 Obstructive sleep apnea (adult) (pediatric): Secondary | ICD-10-CM

## 2017-11-06 DIAGNOSIS — Z9989 Dependence on other enabling machines and devices: Secondary | ICD-10-CM

## 2017-11-06 NOTE — Progress Notes (Signed)
Subjective:    Patient ID: Hannah Nielsen is a 67 y.o. female.  HPI     Interim history:  Hannah Nielsen is a 67 year old right-handed woman with an underlying medical history of hypertension, ACA aneurysm (s/p coiling under Dr. [name] on 12/23/14), coronary artery disease, status post 2 vessel CABG on 07/10/2014, papillary fibroblastoma of the heart, stroke in March 2016, hyperlipidemia, recent smoking cessation, and obesity, who presents for follow-up consultation of her obstructive sleep apnea, established on CPAP therapy. The patient is unaccompanied today and presents for her yearly checkup. I last saw her on 01/31/2017, at which time she was fully compliant with CPAP and felt well. She was advised to follow-up in one year.   She saw Dr. Leonie Man in the interim on 04/24/2017. From the stroke standpoint she was deemed stable and advised to follow-up as needed.  Today, 11/06/2017: I reviewed her CPAP compliance data from 10/03/2017 through 11/01/2017 which is a total of 30 days, during which time she used her CPAP every night with percent used days greater than 4 hours at 100%, indicating superb compliance with an average usage of 6 hours and 18 minutes, residual AHI 4.8 per hour, leak low with the 95th percentile at 5.7 L/m on a pressure of 9 cm with EPR of 2. She reports doing well with her CPAP. She needs new supplies. She does find CPAP to be a nuisance to use but will continue. Her wt has been fluctuating, signed up for weight watchers again. She had a kidney stone some 3 or 4 months ago. She stopped her bladder medication after she passed her kidney stone.   The patient's allergies, current medications, family history, past medical history, past social history, past surgical history and problem list were reviewed and updated as appropriate.    Previously (copied from previous notes for reference):    I saw her on 10/07/2015, at which time she was doing well. We reviewed recent blood work from  her primary care physician's office. She was compliant with CPAP therapy. She was advised to follow-up in one year. She had some intermittent numbness and tingling of the left hand, able to shake it out.   She was seen in the interim by Ward Givens, nurse practitioner in January 2018 for her stroke follow-up and was advised to follow-up in one year, as she was stable.    I reviewed her CPAP compliance data from 09/27/2016 through 10/26/2016 which is a total of 30 days, during which time she used her CPAP every night with percent used days greater than 4 hours at 100%, indicating superb compliance with an average usage of 6 hours and 31 minutes, residual AHI 1.7 per hour, leak low with the 95th percentile at 5.4 L/m on a pressure of 9 cm with EPR of 2.    I saw her on 04/16/2015, at which time we talked about her sleep test results from her baseline sleep study on 01/18/2015 as well as her CPAP titration study from 02/08/2015. She was doing well with CPAP therapy and was also very pleased with her aneurysm coiling results. She felt improved with CPAP. She was fully compliant with treatment. She had no residual stroke related symptoms from March 2016. She quit smoking at the time of her stroke.    I reviewed her CPAP compliance data from 09/06/2015 through 10/05/2015, which is a total of 30 days, during which time she used her machine every night with percent used days greater than 4 hours at  93%, indicating excellent compliance with an average usage of 6 hours and 2 minutes, residual AHI 0.7 per hour, leak low with the 95th percentile at 11.6 L/m on a pressure of 9 cm with EPR of 2.   I first met her on 12/09/2014 at the request of Ward Givens and Dr. Leonie Man, at which time the patient reported snoring, witnessed apneic breathing pauses and excessive daytime somnolence. I invited her back for sleep study. She had a baseline sleep study, followed by a CPAP titration study. I went over her test results  with her in detail today. Her baseline sleep study from 01/18/2015 showed a sleep efficiency of 57.4% with a latency to sleep of 24 minutes and wake after sleep onset of 139 minutes with mild to moderate sleep fragmentation noted. She had an elevated arousal index. She had moderate PLMS with an index of 33 per hour, resulting in 11.7 arousals per hour. She had an increased percentage of light stage sleep, absence of slow-wave sleep and a decreased percentage of REM sleep with a prolonged REM latency. Average oxygen saturation was 93%, nadir was 87%. She had mild to moderate snoring. Total AHI was 15.5 per hour. Based on her sleep-related complaints and her sleep study results, I invited her back for a full night CPAP titration study. She had this on 02/08/2015. Sleep efficiency was 76.4% with a latency to sleep of 40 minutes and wake after sleep onset of 68.5 minutes with mild to moderate sleep fragmentation noted. She had an arousal index of 10 per hour. She had an increased percentage of stage II sleep, absence of slow-wave sleep, and a decreased percentage of REM sleep at 14.2% with a prolonged REM latency of 130 minutes. She had no significant PLMS, EKG or EEG changes. Average oxygen saturation was 92%, nadir was 89%. CPAP was titrated from 5 cm to 11 cm. AHI was 3.7 per hour on the pressure of 9 cm. She had evidence of increase in central apneas on pressure is being on 9 cm. Based on her test results are prescribed CPAP therapy for home use.   I reviewed her CPAP compliance data from 03/16/2015 through 04/14/2015 which is a total of 30 days during which time she used her machine every night with percent used days greater than 4 hours at 100%, indicating superb compliance with an average usage of 7 hours and 45 minutes, residual AHI 2.4 per hour, leak low with the 95th percentile at 4.1 L/m on a pressure of 9 cm with EPR of 2.    12/09/2014: She reports snoring, witnessed apneas per daughter (recently shared  a hotel room) and excessive daytime somnolence. As far as her stroke is concerned. She had woken up with a numbness and weakness of her left hand. Her weakness has improved and she has residual numbness in her left hand, lateral aspect. She is using nicotine lozenges and has quit smoking on 07/01/2014 at the time of her stroke diagnosis. She drinks alcohol very occasionally. She drinks coffee 2 cups a day and no sodas and typically no other caffeine-containing beverages. She tries to drink water. She has no known family history of obstructive sleep apnea but does have a family history of early or sudden death. Her father snored heavily. She reports a bedtime of around 9 or 10 PM and a rise time around 4:30 or 5. She typically does not wake up rested and her Epworth sleepiness score is elevated at 15 out of 24 today, her fatigue  score is 42 out of 63. She lives alone. 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 She is divorced 2. She has 3 grown children from her first marriage. She has a family history of brain aneurysm in her father who died when she was 65 years old from a ruptured brain aneurysm and his mother, her paternal grandmother had a brain aneurysm as well. Dr. Dorris Carnes is her cardiologist, Dr. Roxy Manns is her CT surgeon. She denies frank restless leg symptoms and is not known to twitch her legs and her sleep. She does wake up frequently in the middle of the night. She has to use the bathroom on average 2-3 times per night.   Her Past Medical History Is Significant For: Past Medical History:  Diagnosis Date  . Aneurysm of anterior cerebral artery: Right per MRI 07/01/14 07/02/2014   4.4 mm  . CAD (coronary artery disease), native coronary artery: Severe 2 vessel dz per cardiac cath 07/04/14 07/04/2014  . Cataract    left immature  . Constipation    takes Miralax daily as needed  . GERD (gastroesophageal reflux disease)    rarely takes Tums  . History of colon polyps    benign  .  History of kidney stones   . Hyperlipidemia 07/02/2014   takes Lipitor daily  . Hypertension    takes Amlodipine and HCTZ daily  . Insomnia   . Left atrial mass 07/03/2014   benign papillary fibroelastoma  . Nocturia    takes Ditropan nightly  . Papillary fibroelastoma of heart   . S/P resection of left atrial benign papillary fibroelastoma and CABG x 2 07/10/2014   LIMA to LAD, SVG to RCA, EVH via right thigh  . Shortness of breath dyspnea    with exertion   . Sleep apnea    uses cpap  . Stroke ([place]) 4/82/7078   acute embolic stroke to right MCA territory  . Tingling    left hand,left breast,and left hip  . Urinary frequency   . Urinary urgency     Her Past Surgical History Is Significant For: Past Surgical History:  Procedure Laterality Date  . ABDOMINAL HYSTERECTOMY    . COLONOSCOPY    . CORONARY ARTERY BYPASS GRAFT N/A 07/10/2014   Procedure: CORONARY ARTERY BYPASS GRAFTING (CABG), ON PUMP, TIMES TWO, USING LEFT INTERNAL MAMMARY ARTERY, RIGHT GREATER SAPHENOUS VEIN HARVESTED ENDOSCOPICALLY;  Surgeon: Rexene Alberts, MD;  Location: [place];  Service: Open Heart Surgery;  Laterality: N/A;  . EXCISION OF ATRIAL MYXOMA N/A 07/10/2014   Procedure: RESECTION OF LEFT ATRIAL MASS;  Surgeon: Rexene Alberts, MD;  Location: [place];  Service: Open Heart Surgery;  Laterality: N/A;  . EXTRACORPOREAL SHOCK WAVE LITHOTRIPSY Left 04/13/2017   Procedure: LEFT EXTRACORPOREAL SHOCK WAVE LITHOTRIPSY (ESWL);  Surgeon: Bjorn Loser, MD;  Location: [facility];  Service: Urology;  Laterality: Left;  . LEFT HEART CATHETERIZATION WITH CORONARY ANGIOGRAM N/A 07/04/2014   Procedure: LEFT HEART CATHETERIZATION WITH CORONARY ANGIOGRAM;  Surgeon: Jettie Booze, MD;  Location[facility];  Service: Cardiovascular;  Laterality: N/A;  . RADIOLOGY WITH ANESTHESIA N/A 12/23/2014   Procedure: Stent supported coil embolization OR right ACA aneurysm;  Surgeon: Consuella Lose, MD;  Location: [place] [facility];   Service: Radiology;  Laterality: N/A;  . REPAIR OF PATENT FORAMEN OVALE N/A 07/10/2014   Procedure: REPAIR OF PATENT FORAMEN OVALE;  Surgeon: Rexene Alberts, MD;  Location: [place];  Service: Open Heart Surgery;  Laterality: N/A;  .  TEE WITHOUT CARDIOVERSION N/A 07/03/2014   Procedure: TRANSESOPHAGEAL ECHOCARDIOGRAM (TEE);  Surgeon: Jerline Pain, MD;  Location: [place];  Service: Cardiovascular;  Laterality: N/A;  . TEE WITHOUT CARDIOVERSION N/A 07/10/2014   Procedure: TRANSESOPHAGEAL ECHOCARDIOGRAM (TEE);  Surgeon: Rexene Alberts, MD;  Location: [place];  Service: Open Heart Surgery;  Laterality: N/A;    Her Family History Is Significant For: Family History  Problem Relation Age of Onset  . Hypertension Mother   . Heart failure Mother        Deceased  . Heart attack Mother   . Cerebral aneurysm Father 77       Deceased  . Aneurysm Father   . Breast cancer Maternal Grandmother   . Heart attack Maternal Grandfather   . Hypertension Sister   . Hypertension Brother   . Heart attack Daughter   . Stroke Neg Hx     Her Social History Is Significant For: Social History   Socioeconomic History  . Marital status: Divorced    Spouse name: Not on file  . Number of children: 3  . Years of education: college  . Highest education level: Not on file  Occupational History    Comment: retired Pharmacist, hospital  Social Needs  . Financial resource strain: Not on file  . Food insecurity:    Worry: Not on file    Inability: Not on file  . Transportation needs:    Medical: Not on file    Non-medical: Not on file  Tobacco Use  . Smoking status: Former Smoker    Last attempt to quit: 07/01/2014    Years since quitting: 3.3  . Smokeless tobacco: Never Used  . Tobacco comment: Uses nicotiene losenges  Substance and Sexual Activity  . Alcohol use: Yes    Alcohol/week: 0.6 oz    Types: 1 Cans of beer per week    Comment: rarely  . Drug use: No  . Sexual activity: Not on file  Lifestyle  .  Physical activity:    Days per week: Not on file    Minutes per session: Not on file  . Stress: Not on file  Relationships  . Social connections:    Talks on phone: Not on file    Gets together: Not on file    Attends religious service: Not on file    Active member of club or organization: Not on file    Attends meetings of clubs or organizations: Not on file    Relationship status: Not on file  Other Topics Concern  . Not on file  Social History Narrative   Patient lives at home alone and she is divorced.   Retired Pharmacist, hospital.   Education college.   Right handed.   Caffeine coffee two cups.    Her Allergies Are:  Allergies  Allergen Reactions  . Sulfa Antibiotics     Childhood allergy - unknown  :   Her Current Medications Are:  Outpatient Encounter Medications as of 11/06/2017  Medication Sig  . amLODipine (NORVASC) 5 MG tablet TAKE 1 TABLET(5 MG) BY MOUTH DAILY  . ASPIRIN 81 PO Take by mouth.  Marland Kitchen atorvastatin (LIPITOR) 40 MG tablet Take 40 mg by mouth daily at 6 PM.  . furosemide (LASIX) 40 MG tablet Take as directed for swelling  . hydrochlorothiazide (MICROZIDE) 12.5 MG capsule TAKE 1 CAPSULE(12.5 MG) BY MOUTH DAILY  . nicotine polacrilex (COMMIT) 2 MG lozenge Take 2 mg by mouth as needed for smoking cessation.  . polyethylene  glycol (MIRALAX) packet Take 17 g by mouth daily.  . [DISCONTINUED] oxybutynin (DITROPAN-XL) 10 MG 24 hr tablet Take 10 mg by mouth at bedtime.   No facility-administered encounter medications on file as of 11/06/2017.   :  Review of Systems:  Out of a complete 14 point review of systems, all are reviewed and negative with the exception of these symptoms as listed below:  Review of Systems  Neurological:       Pt presents today to discuss her cpap. Pt reports that it is going well. Pt is due for new supplies.    Objective:  Neurological Exam  Physical Exam Physical Examination:   Vitals:   11/06/17 1148  BP: (!) 144/93  Pulse: 79    General Examination: The patient is a very pleasant 67 y.o. female in no acute distress. She appears well-developed and well-nourished and well groomed.   HEENT:Normocephalic, atraumatic, pupils are equal, round and reactive to light and accommodation. Extraocular tracking is good without limitation to gaze excursion or nystagmus noted. Normal smooth pursuit is noted. Hearing is grossly intact. Face is symmetric with normal facial animation and normal facial sensation. Speech is clear with no dysarthria noted. There is no hypophonia. There is no lip, neck/head, jaw or voice tremor. Neck with FROM. Oropharynx exam reveals: mild mouth dryness, adequate dental hygiene and moderate airway crowding. Tonsils are small. Mallampati is class III. Tongue protrudes centrally and palate elevates symmetrically.   Chest:Clear to auscultation without wheezing, rhonchi or crackles noted.  Heart:S1+S2+0, regular and normal without murmurs, rubs or gallops noted.   Abdomen:Soft, non-tender and non-distended with normal bowel sounds appreciated on auscultation.  Extremities:There is trace pitting edema in the distal lower extremities bilaterally.  Skin: Warm and dry without trophic changes noted.  Musculoskeletal: exam reveals no obvious joint deformities, tenderness or joint swelling or erythema.   Neurologically:  Mental status: The patient is awake, alert and oriented in all 4 spheres. Her immediate and remote memory, attention, language skills and fund of knowledge are appropriate. There is no evidence of aphasia, agnosia, apraxia or anomia. Speech is clear with normal prosody and enunciation. Thought process is linear. Mood is normal and affect is normal.  Cranial nerves II - XII are as described above under HEENT exam. Motor exam: Normal bulk, strength and tone is noted. Romberg is negative. Reflexes are 1-2+. Fine motor skills and coordination: grossly intact.  Cerebellar testing: No  dysmetria or intention tremor on finger to nose testing. There is no truncal or gait ataxia.  Sensory exam: intact to light touch. Gait, station and balance: She stands easily. No veering to one side is noted. No leaning to one side is noted. Posture is age-appropriate and stance is narrow based. Gait shows normal stride length and normal pace. No problems turning are noted.   Assessment and plan:   In summary, Hannah Nielsen is a very pleasant 67 year old female with an underlying medical history of hypertension, ACA aneurysm(s/p elective stent-supported coil embolization under Dr. [name] on 12/23/14),coronary artery disease (s/p 2 vessel CABG on 07/10/2014), papillary fibroblastoma of the heart, stroke in March 2016, hyperlipidemia, prior smoking, history of kidney stone, and obesity, whopresents for follow-up consultation of her obstructive sleep apnea, established on CPAP at 9 cm with ongoing excellent compliance. She had a baseline sleep study and CPAP titration study, both in October 2016. She has moderate sleep apnea and is well treated with her CPAP settings. She is commended for her excellent treatment adherence and  her weight loss endeavor. She is advised to follow-up routinely in one year, she can see one of our NPs, Ward Givens, as she has been stable. Physical exam is stable as well. I renewed her prescription for CPAP supplies. I answered all her questions today and she was in agreement. I spent 20 minutes in total face-to-face time with the patient, more than 50% of which was spent in counseling and coordination of care, reviewing test results, reviewing medication and discussing or reviewing the diagnosis of OSA, its prognosis and treatment options. Pertinent laboratory and imaging test results that were available during this visit with the patient were reviewed by me and considered in my medical decision making (see chart for details).

## 2017-11-06 NOTE — Patient Instructions (Signed)
Keep up the good work! We can see you in 1 year, you can see Ward Givens next time as you are stable.

## 2018-04-17 ENCOUNTER — Other Ambulatory Visit: Payer: Self-pay | Admitting: Geriatric Medicine

## 2018-04-17 DIAGNOSIS — R911 Solitary pulmonary nodule: Secondary | ICD-10-CM

## 2018-04-18 ENCOUNTER — Other Ambulatory Visit: Payer: Self-pay | Admitting: Geriatric Medicine

## 2018-04-18 DIAGNOSIS — R911 Solitary pulmonary nodule: Secondary | ICD-10-CM

## 2018-04-19 ENCOUNTER — Ambulatory Visit
Admission: RE | Admit: 2018-04-19 | Discharge: 2018-04-19 | Disposition: A | Discharge status: Home / Self Care | Payer: Medicare Other | Source: Ambulatory Visit | Attending: Geriatric Medicine | Admitting: Geriatric Medicine

## 2018-04-19 DIAGNOSIS — R911 Solitary pulmonary nodule: Secondary | ICD-10-CM

## 2018-06-13 ENCOUNTER — Encounter: Payer: Self-pay | Admitting: Adult Health

## 2018-07-13 ENCOUNTER — Other Ambulatory Visit: Payer: Self-pay | Admitting: Internal Medicine

## 2018-07-27 ENCOUNTER — Other Ambulatory Visit: Payer: Self-pay | Admitting: Internal Medicine

## 2018-08-10 ENCOUNTER — Ambulatory Visit: Payer: Medicare Other | Admitting: Internal Medicine

## 2018-08-17 ENCOUNTER — Encounter: Payer: Self-pay | Admitting: Internal Medicine

## 2018-08-17 ENCOUNTER — Telehealth (INDEPENDENT_AMBULATORY_CARE_PROVIDER_SITE_OTHER): Payer: Medicare Other | Admitting: Internal Medicine

## 2018-08-17 ENCOUNTER — Other Ambulatory Visit: Payer: Self-pay

## 2018-08-17 VITALS — BP 110/69 | HR 79 | Ht 62.5 in | Wt 207.0 lb

## 2018-08-17 DIAGNOSIS — E782 Mixed hyperlipidemia: Secondary | ICD-10-CM

## 2018-08-17 DIAGNOSIS — I2510 Atherosclerotic heart disease of native coronary artery without angina pectoris: Secondary | ICD-10-CM

## 2018-08-17 DIAGNOSIS — I639 Cerebral infarction, unspecified: Secondary | ICD-10-CM

## 2018-08-17 DIAGNOSIS — I10 Essential (primary) hypertension: Secondary | ICD-10-CM | POA: Diagnosis not present

## 2018-08-17 NOTE — Patient Instructions (Signed)
Medication Instructions:  No changes If you need a refill on your cardiac medications before your next appointment, please call your pharmacy.   Lab work: none If you have labs (blood work) drawn today and your tests are completely normal, you will receive your results only by: Marland Kitchen MyChart Message (if you have MyChart) OR . A paper copy in the mail If you have any lab test that is abnormal or we need to change your treatment, we will call you to review the results.  Testing/Procedures: none  Follow-Up: At [facility], you and your health needs are our priority.  As part of our continuing mission to provide you with exceptional heart care, we have created designated Provider Care Teams.  These Care Teams include your primary Cardiologist (physician) and Advanced Practice Providers (APPs -  Physician Assistants and Nurse Practitioners) who all work together to provide you with the care you need, when you need it. You will need a follow up appointment in:  12 months.  Please call our office 2 months in advance to schedule this appointment.  You may see Dr. Harrington Challenger or one of the following Advanced Practice Providers on your designated Care Team: Richardson Dopp, PA-C [place], [place] . Daune Perch, NP  Any Other Special Instructions Will Be Listed Below (If Applicable).  labwork (lipids) requested from Dr. Carlyle Lipa office to be faxed to [phone].

## 2018-08-17 NOTE — Progress Notes (Signed)
Virtual Visit via Video Note   This visit type was conducted due to national recommendations for restrictions regarding the COVID-19 Pandemic (e.g. social distancing) in an effort to limit this patient's exposure and mitigate transmission in our community.  Due to her co-morbid illnesses, this patient is at least at moderate risk for complications without adequate follow up.  This format is felt to be most appropriate for this patient at this time.  All issues noted in this document were discussed and addressed.  A limited physical exam was performed with this format.  Please refer to the patient's chart for her consent to telehealth for [facility].   Date:  08/17/2018   ID:  Hannah Nielsen, DOB [birth year], MRN [id number]  Patient Location: Home Provider Location: Home  PCP:  Lajean Manes, MD  Cardiologist:  Harrington Challenger Electrophysiologist:  None   Evaluation Performed:  Follow-Up Visit  Chief Complaint:  F/U of CAD  History of Present Illness:    Hannah Nielsen is a 68 y.o. female with with a history of HTN, CVA, L atrial fibroelastoma and R ACA aneurysm.   Pt with CAD  Undwent removal of fibrolastoma and CABG (LIMA to LAD; SVG to RCA) as well as PFO closure  All done in 2016  I last saw her in 2019    PT is doing well from a cardiac standpoint   NO CP  NO SOB   NO palpitations   NO dizziness Follows with H Stoneking  The patient does not have symptoms concerning for COVID-19 infection (fever, chills, cough, or new shortness of breath).    Past Medical History:  Diagnosis Date  . Aneurysm of anterior cerebral artery: Right per MRI 07/01/14 07/02/2014   4.4 mm  . CAD (coronary artery disease), native coronary artery: Severe 2 vessel dz per cardiac cath 07/04/14 07/04/2014  . Cataract    left immature  . Constipation    takes Miralax daily as needed  . GERD (gastroesophageal reflux disease)    rarely takes Tums  . History of colon polyps    benign  . History of kidney stones   .  Hyperlipidemia 07/02/2014   takes Lipitor daily  . Hypertension    takes Amlodipine and HCTZ daily  . Insomnia   . Left atrial mass 07/03/2014   benign papillary fibroelastoma  . Nocturia    takes Ditropan nightly  . Papillary fibroelastoma of heart   . S/P resection of left atrial benign papillary fibroelastoma and CABG x 2 07/10/2014   LIMA to LAD, SVG to RCA, EVH via right thigh  . Shortness of breath dyspnea    with exertion   . Sleep apnea    uses cpap  . Stroke ([place]) 0/01/9322   acute embolic stroke to right MCA territory  . Tingling    left hand,left breast,and left hip  . Urinary frequency   . Urinary urgency    Past Surgical History:  Procedure Laterality Date  . ABDOMINAL HYSTERECTOMY    . COLONOSCOPY    . CORONARY ARTERY BYPASS GRAFT N/A 07/10/2014   Procedure: CORONARY ARTERY BYPASS GRAFTING (CABG), ON PUMP, TIMES TWO, USING LEFT INTERNAL MAMMARY ARTERY, RIGHT GREATER SAPHENOUS VEIN HARVESTED ENDOSCOPICALLY;  Surgeon: Rexene Alberts, MD;  Location: [place];  Service: Open Heart Surgery;  Laterality: N/A;  . EXCISION OF ATRIAL MYXOMA N/A 07/10/2014   Procedure: RESECTION OF LEFT ATRIAL MASS;  Surgeon: Rexene Alberts, MD;  Location: [place];  Service: Open Heart Surgery;  Laterality: N/A;  . EXTRACORPOREAL SHOCK WAVE LITHOTRIPSY Left 04/13/2017   Procedure: LEFT EXTRACORPOREAL SHOCK WAVE LITHOTRIPSY (ESWL);  Surgeon: Bjorn Loser, MD;  [facility];  Service: Urology;  Laterality: Left;  . LEFT HEART CATHETERIZATION WITH CORONARY ANGIOGRAM N/A 07/04/2014   Procedure: LEFT HEART CATHETERIZATION WITH CORONARY ANGIOGRAM;  Surgeon: Jettie Booze, MD;  Location: [facility] C[facility];  Service: Cardiovascular;  Laterality: N/A;  . RADIOLOGY WITH ANESTHESIA N/A 12/23/2014   Procedure: Stent supported coil embolization OR right ACA aneurysm;  Surgeon: Consuella Lose, MD;  Location: [place] [facility];  Service: Radiology;  Laterality: N/A;  . REPAIR OF PATENT FORAMEN OVALE N/A  07/10/2014   Procedure: REPAIR OF PATENT FORAMEN OVALE;  Surgeon: Rexene Alberts, MD;  Location: [place];  Service: Open Heart Surgery;  Laterality: N/A;  . TEE WITHOUT CARDIOVERSION N/A 07/03/2014   Procedure: TRANSESOPHAGEAL ECHOCARDIOGRAM (TEE);  Surgeon: Jerline Pain, MD;  Location: [place];  Service: Cardiovascular;  Laterality: N/A;  . TEE WITHOUT CARDIOVERSION N/A 07/10/2014   Procedure: TRANSESOPHAGEAL ECHOCARDIOGRAM (TEE);  Surgeon: Rexene Alberts, MD;  Location: [place];  Service: Open Heart Surgery;  Laterality: N/A;     No outpatient medications have been marked as taking for the 08/17/18 encounter (Appointment) with Fay Records, MD.     Allergies:   Sulfa antibiotics   Social History   Tobacco Use  . Smoking status: Former Smoker    Last attempt to quit: 07/01/2014    Years since quitting: 4.1  . Smokeless tobacco: Never Used  . Tobacco comment: Uses nicotiene losenges  Substance Use Topics  . Alcohol use: Yes    Alcohol/week: 1.0 standard drinks    Types: 1 Cans of beer per week    Comment: rarely  . Drug use: No     Family Hx: The patient's family history includes Aneurysm in her father; Breast cancer in her maternal grandmother; Cerebral aneurysm (age of onset: 40) in her father; Heart attack in her daughter, maternal grandfather, and mother; Heart failure in her mother; Hypertension in her brother, mother, and sister. There is no history of Stroke.  ROS:   Please see the history of present illness.    All other systems reviewed and are negative.   Prior CV studies:   The following studies were reviewed today:   Labs/Other Tests and Data Reviewed:    EKG:  Not done   Tele visit  Recent Labs: No results found for requested labs within last 8760 hours.   Recent Lipid Panel Lab Results  Component Value Date/Time   CHOL 128 01/04/2016 10:04 AM   TRIG 129 01/04/2016 10:04 AM   HDL 51 01/04/2016 10:04 AM   CHOLHDL 2.5 01/04/2016 10:04 AM   LDLCALC 51  01/04/2016 10:04 AM    Wt Readings from Last 3 Encounters:  11/06/17 210 lb (95.3 kg)  08/07/17 204 lb (92.5 kg)  04/24/17 200 lb 12.8 oz (91.1 kg)     Objective:    Vital Signs:  There were no vitals taken for this visit.   BP has been:   134/82   138/82   116/72   ASSESSMENT & PLAN:    1. CAD  No symptoms of angina     2  HTN  BP is good  3  Hx of CVA   S/p closure of PFO and also removal of fibroelastoma       4  HL  WIll get lipids from [employer]  COVID-19 Education: The signs and symptoms of COVID-19 were discussed with the patient and how to seek care for testing (follow up with PCP or arrange E-visit).  The importance of social distancing was discussed today.  Time:   Today, I have spent 20 minutes with the patient with telehealth technology discussing the above problems.     Medication Adjustments/Labs and Tests Ordered: Current medicines are reviewed at length with the patient today.  Concerns regarding medicines are outlined above.   Tests Ordered: No orders of the defined types were placed in this encounter.   Medication Changes: No orders of the defined types were placed in this encounter.   Disposition:  Follow up 1 year   Signed, Dorris Carnes, MD  08/17/2018 2:11 PM    [place]

## 2018-08-22 ENCOUNTER — Other Ambulatory Visit: Payer: Self-pay | Admitting: *Deleted

## 2018-08-22 DIAGNOSIS — E782 Mixed hyperlipidemia: Secondary | ICD-10-CM

## 2018-08-22 DIAGNOSIS — E781 Pure hyperglyceridemia: Secondary | ICD-10-CM

## 2018-10-23 ENCOUNTER — Encounter: Payer: Self-pay | Admitting: Adult Health

## 2018-10-25 ENCOUNTER — Ambulatory Visit (INDEPENDENT_AMBULATORY_CARE_PROVIDER_SITE_OTHER): Payer: Medicare Other | Admitting: Adult Health

## 2018-10-25 ENCOUNTER — Encounter: Payer: Self-pay | Admitting: Adult Health

## 2018-10-25 ENCOUNTER — Other Ambulatory Visit: Payer: Self-pay

## 2018-10-25 VITALS — BP 140/82 | HR 83 | Temp 98.4°F | Ht 62.0 in | Wt 216.0 lb

## 2018-10-25 DIAGNOSIS — Z9989 Dependence on other enabling machines and devices: Secondary | ICD-10-CM

## 2018-10-25 DIAGNOSIS — G4733 Obstructive sleep apnea (adult) (pediatric): Secondary | ICD-10-CM | POA: Diagnosis not present

## 2018-10-25 NOTE — Patient Instructions (Signed)

## 2018-10-25 NOTE — Progress Notes (Addendum)
PATIENT: Hannah Nielsen DOB: [birth year]  REASON FOR VISIT: follow up HISTORY FROM: patient  HISTORY OF PRESENT ILLNESS: Today 10/25/18:  Ms. Poch is a 68 year old female with a history of obstructive sleep apnea on CPAP.  Her CPAP download indicates that she used her machine 30 out of 30 days for compliance of 100%.  She used her machine greater than 4 hours 29 days for compliance of 97%.  On average she uses her machine 5 hours and 38 minutes.  Her residual AHI is 1.4 on 9 cm of water with EPR of 2.  She states that most nights she has a hard time sleeping due to dry mouth.  She is unsure if this was present before the CPAP.  She has tried using Biotene but that did not offer much benefit.  She states that she is unable to turn her humidity on the machine too high as it makes a gurgling noise.  She returns today for follow-up.  HISTORY 11/06/2017: I reviewed her CPAP compliance data from 10/03/2017 through 11/01/2017 which is a total of 30 days, during which time she used her CPAP every night with percent used days greater than 4 hours at 100%, indicating superb compliance with an average usage of 6 hours and 18 minutes, residual AHI 4.8 per hour, leak low with the 95th percentile at 5.7 L/m on a pressure of 9 cm with EPR of 2. She reports doing well with her CPAP. She needs new supplies. She does find CPAP to be a nuisance to use but will continue. Her wt has been fluctuating, signed up for weight watchers again. She had a kidney stone some 3 or 4 months ago. She stopped her bladder medication after she passed her kidney stone.   REVIEW OF SYSTEMS: Out of a complete 14 system review of symptoms, the patient complains only of the following symptoms, and all other reviewed systems are negative.  See HPI  ALLERGIES: Allergies  Allergen Reactions  . Sulfa Antibiotics     Childhood allergy - unknown    HOME MEDICATIONS: Outpatient Medications Prior to Visit  Medication Sig Dispense  Refill  . amLODipine (NORVASC) 5 MG tablet TAKE 1 TABLET(5 MG) BY MOUTH DAILY 90 tablet 3  . ASPIRIN 81 PO Take by mouth.    Marland Kitchen atorvastatin (LIPITOR) 40 MG tablet Take 40 mg by mouth daily at 6 PM.    . CALCIUM PO Take 2 tablets by mouth daily.    . Cholecalciferol (VITAMIN D) 50 MCG (2000 UT) tablet Take 2,000 Units by mouth daily. OTC    . hydrochlorothiazide (MICROZIDE) 12.5 MG capsule TAKE 1 CAPSULE(12.5 MG) BY MOUTH DAILY 90 capsule 1  . HYDROcodone-acetaminophen (NORCO/VICODIN) 5-325 MG tablet TK 1 TO 2 TS PO Q 6 H PRN    . KRILL OIL PO Take 1 capsule by mouth daily.    . Multiple Vitamin (MULTIVITAMIN) tablet Take 1 tablet by mouth daily.    . nicotine polacrilex (COMMIT) 2 MG lozenge Take 2 mg by mouth as needed for smoking cessation.    . polyethylene glycol (MIRALAX) packet Take 17 g by mouth daily.    . tamsulosin (FLOMAX) 0.4 MG CAPS capsule Take 0.4 mg by mouth daily.     No facility-administered medications prior to visit.     PAST MEDICAL HISTORY: Past Medical History:  Diagnosis Date  . Aneurysm of anterior cerebral artery: Right per MRI 07/01/14 07/02/2014   4.4 mm  . CAD (coronary artery disease), native  coronary artery: Severe 2 vessel dz per cardiac cath 07/04/14 07/04/2014  . Cataract    left immature  . Constipation    takes Miralax daily as needed  . GERD (gastroesophageal reflux disease)    rarely takes Tums  . History of colon polyps    benign  . History of kidney stones   . Hyperlipidemia 07/02/2014   takes Lipitor daily  . Hypertension    takes Amlodipine and HCTZ daily  . Insomnia   . Left atrial mass 07/03/2014   benign papillary fibroelastoma  . Nocturia    takes Ditropan nightly  . Papillary fibroelastoma of heart   . S/P resection of left atrial benign papillary fibroelastoma and CABG x 2 07/10/2014   LIMA to LAD, SVG to RCA, EVH via right thigh  . Shortness of breath dyspnea    with exertion   . Sleep apnea    uses cpap  . Stroke ([place])  5/95/6387   acute embolic stroke to right MCA territory  . Tingling    left hand,left breast,and left hip  . Urinary frequency   . Urinary urgency     PAST SURGICAL HISTORY: Past Surgical History:  Procedure Laterality Date  . ABDOMINAL HYSTERECTOMY    . COLONOSCOPY    . CORONARY ARTERY BYPASS GRAFT N/A 07/10/2014   Procedure: CORONARY ARTERY BYPASS GRAFTING (CABG), ON PUMP, TIMES TWO, USING LEFT INTERNAL MAMMARY ARTERY, RIGHT GREATER SAPHENOUS VEIN HARVESTED ENDOSCOPICALLY;  Surgeon: Rexene Alberts, MD;  Location: [place];  Service: Open Heart Surgery;  Laterality: N/A;  . EXCISION OF ATRIAL MYXOMA N/A 07/10/2014   Procedure: RESECTION OF LEFT ATRIAL MASS;  Surgeon: Rexene Alberts, MD;  Location: [place];  Service: Open Heart Surgery;  Laterality: N/A;  . EXTRACORPOREAL SHOCK WAVE LITHOTRIPSY Left 04/13/2017   Procedure: LEFT EXTRACORPOREAL SHOCK WAVE LITHOTRIPSY (ESWL);  Surgeon: Bjorn Loser, MD;  [facility];  Service: Urology;  Laterality: Left;  . LEFT HEART CATHETERIZATION WITH CORONARY ANGIOGRAM N/A 07/04/2014   Procedure: LEFT HEART CATHETERIZATION WITH CORONARY ANGIOGRAM;  Surgeon: Jettie Booze, MD;  Location: [facility] C[facility] LAB;  Service: Cardiovascular;  Laterality: N/A;  . RADIOLOGY WITH ANESTHESIA N/A 12/23/2014   Procedure: Stent supported coil embolization OR right ACA aneurysm;  Surgeon: Consuella Lose, MD;  Location: [place] [facility];  Service: Radiology;  Laterality: N/A;  . REPAIR OF PATENT FORAMEN OVALE N/A 07/10/2014   Procedure: REPAIR OF PATENT FORAMEN OVALE;  Surgeon: Rexene Alberts, MD;  Location: [place];  Service: Open Heart Surgery;  Laterality: N/A;  . TEE WITHOUT CARDIOVERSION N/A 07/03/2014   Procedure: TRANSESOPHAGEAL ECHOCARDIOGRAM (TEE);  Surgeon: Jerline Pain, MD;  Location: [place];  Service: Cardiovascular;  Laterality: N/A;  . TEE WITHOUT CARDIOVERSION N/A 07/10/2014   Procedure: TRANSESOPHAGEAL ECHOCARDIOGRAM (TEE);  Surgeon: Rexene Alberts, MD;   Location: [place];  Service: Open Heart Surgery;  Laterality: N/A;    FAMILY HISTORY: Family History  Problem Relation Age of Onset  . Hypertension Mother   . Heart failure Mother        Deceased  . Heart attack Mother   . Cerebral aneurysm Father 74       Deceased  . Aneurysm Father   . Breast cancer Maternal Grandmother   . Heart attack Maternal Grandfather   . Hypertension Sister   . Hypertension Brother   . Heart attack Daughter   . Stroke Neg Hx     SOCIAL HISTORY: Social History   Socioeconomic  History  . Marital status: Divorced    Spouse name: Not on file  . Number of children: 3  . Years of education: college  . Highest education level: Not on file  Occupational History    Comment: retired Pharmacist, hospital  Social Needs  . Financial resource strain: Not on file  . Food insecurity    Worry: Not on file    Inability: Not on file  . Transportation needs    Medical: Not on file    Non-medical: Not on file  Tobacco Use  . Smoking status: Former Smoker    Quit date: 07/01/2014    Years since quitting: 4.3  . Smokeless tobacco: Never Used  . Tobacco comment: Uses nicotiene losenges  Substance and Sexual Activity  . Alcohol use: Yes    Alcohol/week: 1.0 standard drinks    Types: 1 Cans of beer per week    Comment: rarely  . Drug use: No  . Sexual activity: Not on file  Lifestyle  . Physical activity    Days per week: Not on file    Minutes per session: Not on file  . Stress: Not on file  Relationships  . Social Herbalist on phone: Not on file    Gets together: Not on file    Attends religious service: Not on file    Active member of club or organization: Not on file    Attends meetings of clubs or organizations: Not on file    Relationship status: Not on file  . Intimate partner violence    Fear of current or ex partner: Not on file    Emotionally abused: Not on file    Physically abused: Not on file    Forced sexual activity: Not on file  Other  Topics Concern  . Not on file  Social History Narrative   Patient lives at home alone and she is divorced.   Retired Pharmacist, hospital.   Education college.   Right handed.   Caffeine coffee two cups.      PHYSICAL EXAM  Vitals:   10/25/18 1324  BP: 140/82  Pulse: 83  Temp: 98.4 F (36.9 C)  Weight: 216 lb (98 kg)  Height: 5\' 2"  (1.575 m)   Body mass index is 39.51 kg/m.  Generalized: Well developed, in no acute distress  Chest: Lungs clear to auscultation bilaterally  Neurological examination  Mentation: Alert oriented to time, place, history taking. Follows all commands speech and language fluent Cranial nerve II-XII: Pupils were equal round reactive to light. Extraocular movements were full, visual field were full on confrontational test.  Motor: The motor testing reveals 5 over 5 strength of all 4 extremities. Good symmetric motor tone is noted throughout.  Sensory: Sensory testing is intact to soft touch on all 4 extremities. No evidence of extinction is noted.   Gait and station: Gait is normal.    DIAGNOSTIC DATA (LABS, IMAGING, TESTING) - I reviewed patient records, labs, notes, testing and imaging myself where available.  Lab Results  Component Value Date   WBC 10.2 01/04/2016   HGB 15.1 01/04/2016   HCT 44.1 01/04/2016   MCV 87.8 01/04/2016   PLT 333 01/04/2016      Component Value Date/Time   NA 140 01/04/2016 1004   K 4.3 01/04/2016 1004   CL 101 01/04/2016 1004   CO2 29 01/04/2016 1004   GLUCOSE 95 01/04/2016 1004   BUN 16 01/04/2016 1004   CREATININE 0.79 01/04/2016 1004  CALCIUM 9.9 01/04/2016 1004   PROT 7.0 09/22/2014 1035   ALBUMIN 3.9 09/22/2014 1035   AST 24 09/22/2014 1035   ALT 37 (H) 09/22/2014 1035   ALKPHOS 118 (H) 09/22/2014 1035   BILITOT 0.4 09/22/2014 1035   GFRNONAA >60 07/28/2015 0620   GFRAA >60 07/28/2015 0620   Lab Results  Component Value Date   CHOL 128 01/04/2016   HDL 51 01/04/2016   LDLCALC 51 01/04/2016   TRIG 129  01/04/2016   CHOLHDL 2.5 01/04/2016   Lab Results  Component Value Date   HGBA1C 6.1 (H) 07/08/2014   No results found for: VITAMINB12 No results found for: TSH    ASSESSMENT AND PLAN 68 y.o. year old female  has a past medical history of Aneurysm of anterior cerebral artery: Right per MRI 07/01/14 (07/02/2014), CAD (coronary artery disease), native coronary artery: Severe 2 vessel dz per cardiac cath 07/04/14 (07/04/2014), Cataract, Constipation, GERD (gastroesophageal reflux disease), History of colon polyps, History of kidney stones, Hyperlipidemia (07/02/2014), Hypertension, Insomnia, Left atrial mass (07/03/2014), Nocturia, Papillary fibroelastoma of heart, S/P resection of left atrial benign papillary fibroelastoma and CABG x 2 (07/10/2014), Shortness of breath dyspnea, Sleep apnea, Stroke ([place]) (07/01/2014), Tingling, Urinary frequency, and Urinary urgency. here with:  1.  Obstructive sleep apnea on CPAP  Patient CPAP download shows excellent compliance and good treatment of her apnea.  She is encouraged to continue using CPAP nightly and greater than 4 hours each night.  I advised the patient to look at her humidity settings if she is unable to turn it up any higher because of the noise she should reach out to her DME company.  Also advised that she could turn on the heated tubing to see if it offers her any benefit as well.  She is advised that if her symptoms worsen or she develops new symptoms she should let us know.  She will follow-up in 1 year or sooner if needed.    I spent 15 minutes with the patient. 50% of this time was spent reviewing CPAP download   Ward Givens, MSN, NP-C 10/25/2018, 1:38 PM [facility] [address] [place], [place] [address] [phone]  I reviewed the above note and documentation by the Nurse Practitioner and agree with the history, physical exam, assessment and plan as outlined above. I was immediately available for  face-to-face consultation. Star Age, MD, PhD [facility])

## 2018-11-08 ENCOUNTER — Ambulatory Visit: Payer: Medicare Other | Admitting: Adult Health

## 2019-01-28 ENCOUNTER — Other Ambulatory Visit: Payer: Self-pay | Admitting: Internal Medicine

## 2019-01-28 MED ORDER — HYDROCHLOROTHIAZIDE 12.5 MG PO CAPS: ORAL_CAPSULE | ORAL | 1 refills | Status: DC

## 2019-04-15 ENCOUNTER — Other Ambulatory Visit: Payer: Self-pay | Admitting: Internal Medicine

## 2019-06-17 DIAGNOSIS — Z79899 Other long term (current) drug therapy: Secondary | ICD-10-CM | POA: Diagnosis not present

## 2019-06-17 DIAGNOSIS — I10 Essential (primary) hypertension: Secondary | ICD-10-CM | POA: Diagnosis not present

## 2019-06-26 DIAGNOSIS — G4733 Obstructive sleep apnea (adult) (pediatric): Secondary | ICD-10-CM | POA: Diagnosis not present

## 2019-07-29 ENCOUNTER — Other Ambulatory Visit: Payer: Self-pay | Admitting: Internal Medicine

## 2019-07-31 ENCOUNTER — Other Ambulatory Visit: Payer: Self-pay

## 2019-07-31 MED ORDER — HYDROCHLOROTHIAZIDE 12.5 MG PO CAPS: ORAL_CAPSULE | ORAL | 0 refills | Status: DC

## 2019-08-22 ENCOUNTER — Ambulatory Visit: Payer: Medicare PPO | Admitting: Internal Medicine

## 2019-08-22 ENCOUNTER — Encounter: Payer: Self-pay | Admitting: Internal Medicine

## 2019-08-22 ENCOUNTER — Other Ambulatory Visit: Payer: Self-pay

## 2019-08-22 ENCOUNTER — Encounter: Payer: Self-pay | Admitting: *Deleted

## 2019-08-22 ENCOUNTER — Telehealth: Payer: Self-pay | Admitting: Internal Medicine

## 2019-08-22 VITALS — BP 138/86 | HR 89 | Ht 62.0 in | Wt 216.6 lb

## 2019-08-22 DIAGNOSIS — R0602 Shortness of breath: Secondary | ICD-10-CM

## 2019-08-22 DIAGNOSIS — I10 Essential (primary) hypertension: Secondary | ICD-10-CM

## 2019-08-22 DIAGNOSIS — I2510 Atherosclerotic heart disease of native coronary artery without angina pectoris: Secondary | ICD-10-CM | POA: Diagnosis not present

## 2019-08-22 MED ORDER — HYDROCHLOROTHIAZIDE 12.5 MG PO CAPS: ORAL_CAPSULE | ORAL | 3 refills | Status: DC

## 2019-08-22 MED ORDER — LISINOPRIL 10 MG PO TABS: 10.0000 mg | ORAL_TABLET | Freq: Every day | ORAL | 3 refills | Status: DC

## 2019-08-22 MED ORDER — AMLODIPINE BESYLATE 5 MG PO TABS: 5.0000 mg | ORAL_TABLET | Freq: Every day | ORAL | 2 refills | Status: DC

## 2019-08-22 MED ORDER — ATORVASTATIN CALCIUM 40 MG PO TABS: 40.0000 mg | ORAL_TABLET | Freq: Every day | ORAL | 3 refills | Status: DC

## 2019-08-22 NOTE — Telephone Encounter (Signed)
Follow Up:    On pt 's Hydrochlorothiazide it was for only 56, pharmacist was checking to make sure it was not for 90

## 2019-08-22 NOTE — Progress Notes (Signed)
Cardiology Office Note   Date:  08/22/2019   ID:  Hannah Nielsen, DOB Aug 02, 1950, MRN [id number]  PCP:  Hannah Manes, MD  Cardiologist:   Hannah Carnes, MD   F/U of CAD and HTN    History of Present Illness: Hannah Nielsen is a 69 y.o. female with a history ofHarriet Nielsen is a 69 y.o. female with with a history of HTN, CVA, L atrial fibroelastoma and R ACA aneurysm.   Pt with CAD  Undwent removal of fibrolastoma and CABG (LIMA to LAD; SVG to RCA) as well as PFO closure  All done in 2016   Pt was last in [place] in 2019  I saw her as a televist in May 2020    Patient says she gets out of breath with walking especially if up hills       Current Meds  Medication Sig  . amLODipine (NORVASC) 5 MG tablet TAKE 1 TABLET BY MOUTH EVERY DAY  . ASPIRIN 81 PO Take by mouth.  Marland Kitchen atorvastatin (LIPITOR) 40 MG tablet Take 40 mg by mouth daily at 6 PM.  . CALCIUM PO Take 2 tablets by mouth daily.  . Cholecalciferol (VITAMIN D) 50 MCG (2000 UT) tablet Take 2,000 Units by mouth daily. OTC  . hydrochlorothiazide (MICROZIDE) 12.5 MG capsule TAKE 1 CAPSULE(12.5 MG) BY MOUTH DAILY. Please make yearly appt with Dr. Harrington Challenger for May before anymore refills. 1st attempt  . KRILL OIL PO Take 1 capsule by mouth daily.  Marland Kitchen lisinopril (ZESTRIL) 5 MG tablet Take 5 mg by mouth daily.  . Multiple Vitamin (MULTIVITAMIN) tablet Take 1 tablet by mouth daily.  . nicotine polacrilex (COMMIT) 2 MG lozenge Take 2 mg by mouth as needed for smoking cessation.  . polyethylene glycol (MIRALAX) packet Take 17 g by mouth daily.     Allergies:   Sulfa antibiotics   Past Medical History:  Diagnosis Date  . Aneurysm of anterior cerebral artery: Right per MRI 07/01/14 07/02/2014   4.4 mm  . CAD (coronary artery disease), native coronary artery: Severe 2 vessel dz per cardiac cath 07/04/14 07/04/2014  . Cataract    left immature  . Constipation    takes Miralax daily as needed  . GERD (gastroesophageal reflux disease)    rarely  takes Tums  . History of colon polyps    benign  . History of kidney stones   . Hyperlipidemia 07/02/2014   takes Lipitor daily  . Hypertension    takes Amlodipine and HCTZ daily  . Insomnia   . Left atrial mass 07/03/2014   benign papillary fibroelastoma  . Nocturia    takes Ditropan nightly  . Papillary fibroelastoma of heart   . S/P resection of left atrial benign papillary fibroelastoma and CABG x 2 07/10/2014   LIMA to LAD, SVG to RCA, EVH via right thigh  . Shortness of breath dyspnea    with exertion   . Sleep apnea    uses cpap  . Stroke ([place]) 0000000   acute embolic stroke to right MCA territory  . Tingling    left hand,left breast,and left hip  . Urinary frequency   . Urinary urgency     Past Surgical History:  Procedure Laterality Date  . ABDOMINAL HYSTERECTOMY    . COLONOSCOPY    . CORONARY ARTERY BYPASS GRAFT N/A 07/10/2014   Procedure: CORONARY ARTERY BYPASS GRAFTING (CABG), ON PUMP, TIMES TWO, USING LEFT INTERNAL MAMMARY ARTERY, RIGHT GREATER SAPHENOUS VEIN HARVESTED ENDOSCOPICALLY;  Surgeon: Valentina Gu  Hannah Manns, MD;  Location: [facility];  Service: Open Heart Surgery;  Laterality: N/A;  . EXCISION OF ATRIAL MYXOMA N/A 07/10/2014   Procedure: RESECTION OF LEFT ATRIAL MASS;  Surgeon: Rexene Alberts, MD;  Location: [facility];  Service: Open Heart Surgery;  Laterality: N/A;  . EXTRACORPOREAL SHOCK WAVE LITHOTRIPSY Left 04/13/2017   Procedure: LEFT EXTRACORPOREAL SHOCK WAVE LITHOTRIPSY (ESWL);  Surgeon: Bjorn Loser, MD;  Location: [facility];  Service: Urology;  Laterality: Left;  . LEFT HEART CATHETERIZATION WITH CORONARY ANGIOGRAM N/A 07/04/2014   Procedure: LEFT HEART CATHETERIZATION WITH CORONARY ANGIOGRAM;  Surgeon: Jettie Booze, MD;  Location: [facility] CATH LAB;  Service: Cardiovascular;  Laterality: N/A;  . RADIOLOGY WITH ANESTHESIA N/A 12/23/2014   Procedure: Stent supported coil embolization OR right ACA aneurysm;  Surgeon: Consuella Lose, MD;  Location: [facility];   Service: Radiology;  Laterality: N/A;  . REPAIR OF PATENT FORAMEN OVALE N/A 07/10/2014   Procedure: REPAIR OF PATENT FORAMEN OVALE;  Surgeon: Rexene Alberts, MD;  Location: [facility];  Service: Open Heart Surgery;  Laterality: N/A;  . TEE WITHOUT CARDIOVERSION N/A 07/03/2014   Procedure: TRANSESOPHAGEAL ECHOCARDIOGRAM (TEE);  Surgeon: Jerline Pain, MD;  Location: [facility];  Service: Cardiovascular;  Laterality: N/A;  . TEE WITHOUT CARDIOVERSION N/A 07/10/2014   Procedure: TRANSESOPHAGEAL ECHOCARDIOGRAM (TEE);  Surgeon: Rexene Alberts, MD;  Location: [facility];  Service: Open Heart Surgery;  Laterality: N/A;     Social History:  The patient  reports that she quit smoking about 5 years ago. She has never used smokeless tobacco. She reports current alcohol use of about 1.0 standard drinks of alcohol per week. She reports that she does not use drugs.   Family History:  The patient's family history includes Aneurysm in her father; Breast cancer in her maternal grandmother; Cerebral aneurysm (age of onset: 25) in her father; Heart attack in her daughter, maternal grandfather, and mother; Heart failure in her mother; Hypertension in her brother, mother, and sister.    ROS:  Please see the history of present illness. All other systems are reviewed and  Negative to the above problem except as noted.    PHYSICAL EXAM: VS:  BP 138/86   Pulse 89   Ht 5\' 2"  (1.575 m)   Wt 216 lb 9.6 oz (98.2 kg)   BMI 39.62 kg/m   GEN: Well nourished, well developed, in no acute distress  HEENT: normal  Neck: no JVD, carotid bruits, or masses Cardiac: RRR; no murmurs, rubs, or gallops,no edema  Respiratory:  clear to auscultation bilaterally, normal work of breathing GI: soft, nontender, nondistended, + BS  No hepatomegaly  MS: no deformity Moving all extremities   Skin: warm and dry, no rash Neuro:  Strength and sensation are intact Psych: euthymic mood, full affect   EKG:  EKG is ordered today.  SR 89 bpm    Incomp Rbbb   Lipid Panel    Component Value Date/Time   CHOL 128 01/04/2016 1004   TRIG 129 01/04/2016 1004   HDL 51 01/04/2016 1004   CHOLHDL 2.5 01/04/2016 1004   VLDL 26 01/04/2016 1004   LDLCALC 51 01/04/2016 1004      Wt Readings from Last 3 Encounters:  08/22/19 216 lb 9.6 oz (98.2 kg)  10/25/18 216 lb (98 kg)  08/17/18 207 lb (93.9 kg)      ASSESSMENT AND PLAN:  1  CAD   CABG in 2016 (LIMA to LAD; SVG to RCA)  Complains  of some increased dyspnea with exerction    I would recomm a Lexiscan myovue to r/o ischemia     2.  HTN  Increase lisinopril to 10   Keep track of BP  3 HL  LDL 53  HDL 49   Continue meds   F/U tentatively  in Feb 2022   Current medicines are reviewed at length with the patient today.  The patient does not have concerns regarding medicines.  Signed, Hannah Carnes, MD  08/22/2019 2:36 PM    [address], [place], [place]  [address] Phone: [phone]; Fax: [phone]

## 2019-08-22 NOTE — Patient Instructions (Signed)
Medication Instructions:  Your physician has recommended you make the following change in your medication: INCREASE LISINOPRIL TO 10 MG EVERY DAY  *If you need a refill on your cardiac medications before your next appointment, please call your pharmacy*   Lab Work: NONE If you have labs (blood work) drawn today and your tests are completely normal, you will receive your results only by: Marland Kitchen MyChart Message (if you have MyChart) OR . A paper copy in the mail If you have any lab test that is abnormal or we need to change your treatment, we will call you to review the results.   Testing/Procedures: Your physician has requested that you have a lexiscan myoview. For further information please visit [email or web address]. Please follow instruction sheet, as given.    Follow-Up: At [facility], you and your health needs are our priority.  As part of our continuing mission to provide you with exceptional heart care, we have created designated Provider Care Teams.  These Care Teams include your primary Cardiologist (physician) and Advanced Practice Providers (APPs -  Physician Assistants and Nurse Practitioners) who all work together to provide you with the care you need, when you need it.  We recommend signing up for the patient portal called "MyChart".  Sign up information is provided on this After Visit Summary.  MyChart is used to connect with patients for Virtual Visits (Telemedicine).  Patients are able to view lab/test results, encounter notes, upcoming appointments, etc.  Non-urgent messages can be sent to your provider as well.   To learn more about what you can do with MyChart, go to [email or web address].    Your next appointment:   8 month(s)  The format for your next appointment:   In Person  Provider:   Dorris Carnes, MD   Other Instructions

## 2019-08-22 NOTE — Telephone Encounter (Signed)
Pt's medication was resent to pt's pharmacy correctly as requested. Confirmation received.

## 2019-08-27 ENCOUNTER — Telehealth (HOSPITAL_COMMUNITY): Payer: Self-pay

## 2019-08-27 NOTE — Telephone Encounter (Signed)
Spoke with the patient, detailed instructions given. She stated that she understood and would be here for her test. Asked to call back with any questions. S.Williams EMTP 

## 2019-08-29 ENCOUNTER — Ambulatory Visit (HOSPITAL_COMMUNITY): Payer: Medicare PPO | Attending: Cardiology

## 2019-08-29 ENCOUNTER — Other Ambulatory Visit: Payer: Self-pay

## 2019-08-29 DIAGNOSIS — I2510 Atherosclerotic heart disease of native coronary artery without angina pectoris: Secondary | ICD-10-CM | POA: Insufficient documentation

## 2019-08-29 DIAGNOSIS — R0602 Shortness of breath: Secondary | ICD-10-CM | POA: Insufficient documentation

## 2019-08-29 LAB — MYOCARDIAL PERFUSION IMAGING
LV dias vol: 65 mL (ref 46–106)
LV sys vol: 19 mL
Peak HR: 90 {beats}/min
Rest HR: 81 {beats}/min
SDS: 0
SRS: 0
SSS: 0
TID: 1.08

## 2019-08-29 MED ORDER — TECHNETIUM TC 99M TETROFOSMIN IV KIT
10.3000 | PACK | Freq: Once | INTRAVENOUS | Status: AC | PRN
  Administered 2019-08-29: 10.3 via INTRAVENOUS
  Filled 2019-08-29: qty 11

## 2019-08-29 MED ORDER — TECHNETIUM TC 99M TETROFOSMIN IV KIT
31.6000 | PACK | Freq: Once | INTRAVENOUS | Status: AC | PRN
  Administered 2019-08-29: 31.6 via INTRAVENOUS
  Filled 2019-08-29: qty 32

## 2019-08-29 MED ORDER — REGADENOSON 0.4 MG/5ML IV SOLN
0.4000 mg | Freq: Once | INTRAVENOUS | Status: AC
  Administered 2019-08-29: 0.4 mg via INTRAVENOUS

## 2019-09-24 DIAGNOSIS — G4733 Obstructive sleep apnea (adult) (pediatric): Secondary | ICD-10-CM | POA: Diagnosis not present

## 2019-10-01 NOTE — Progress Notes (Signed)
Cardiology Office Note:    Date:  10/02/2019   ID:  Taliana Mersereau, DOB 02-26-1951, MRN [id number]  PCP:  Lajean Manes, MD  Cardiologist:  Dorris Carnes, MD   Electrophysiologist:  None   Referring MD: Lajean Manes, MD   Chief Complaint:  Follow-up (HTN, Dyspnea )    Patient Profile:    Hannah Nielsen is a 69 y.o. female with:   Coronary artery disease   S/p CABG in 06/2014  Papillary fibroelastoma   S/p resection with closure of Patent Foramen [place]vale 06/2014   Hx of stroke 06/2014  Hypertension   Hyperlipidemia   Tobacco use   R ACA aneurysm   S/p coiling in 9/16  Prior CV studies: Myoview 08/29/19 EF 70, normal perfusion; low risk   Echocardiogram 08/19/14 EF 60-65, no RWMA  History of Present Illness:    Ms. Vanwyk was last seen by Dr. Harrington Challenger in 08/2019.  She had symptoms of shortness of breath.  A Myoview was obtained and this was normal. Her BP was elevated during the test and she was asked to collect BP readings and return for follow up.     She is here alone today.  She has been doing well since last seen.  She continues to have shortness of breath with exertion.  This has been a chronic symptom without significant change.  She has not had chest pain, syncope, orthopnea, leg swelling.  She uses CPAP at night.  She has a mild non-productive cough.     Past Medical History:  Diagnosis Date  . Aneurysm of anterior cerebral artery: Right per MRI 07/01/14 07/02/2014   4.4 mm  . CAD (coronary artery disease), native coronary artery: Severe 2 vessel dz per cardiac cath 07/04/14 07/04/2014  . Cataract    left immature  . Constipation    takes Miralax daily as needed  . GERD (gastroesophageal reflux disease)    rarely takes Tums  . History of colon polyps    benign  . History of kidney stones   . Hyperlipidemia 07/02/2014   takes Lipitor daily  . Hypertension    takes Amlodipine and HCTZ daily  . Insomnia   . Left atrial mass 07/03/2014   benign papillary  fibroelastoma  . Nocturia    takes Ditropan nightly  . Papillary fibroelastoma of heart   . S/P resection of left atrial benign papillary fibroelastoma and CABG x 2 07/10/2014   LIMA to LAD, SVG to RCA, EVH via right thigh  . Shortness of breath dyspnea    with exertion   . Sleep apnea    uses cpap  . Stroke ([place]) 2/80/0349   acute embolic stroke to right MCA territory  . Tingling    left hand,left breast,and left hip  . Urinary frequency   . Urinary urgency     Current Medications: Current Meds  Medication Sig  . amLODipine (NORVASC) 5 MG tablet Take 1 tablet (5 mg total) by mouth daily.  . ASPIRIN 81 PO Take 81 mg by mouth daily.   Marland Kitchen atorvastatin (LIPITOR) 40 MG tablet Take 1 tablet (40 mg total) by mouth daily at 6 PM.  . CALCIUM PO Take 2 tablets by mouth daily.  . Cholecalciferol (VITAMIN D) 50 MCG (2000 UT) tablet Take 2,000 Units by mouth daily. OTC  . hydrochlorothiazide (MICROZIDE) 12.5 MG capsule TAKE 1 CAPSULE(12.5 MG) BY MOUTH DAILY.  Marland Kitchen KRILL OIL PO Take 1 capsule by mouth daily.  Marland Kitchen lisinopril (ZESTRIL) 10 MG tablet Take  1 tablet (10 mg total) by mouth daily.  . Multiple Vitamin (MULTIVITAMIN) tablet Take 1 tablet by mouth daily.  . nicotine polacrilex (COMMIT) 2 MG lozenge Take 2 mg by mouth as needed for smoking cessation.  . polyethylene glycol (MIRALAX) packet Take 17 g by mouth daily.     Allergies:   Sulfa antibiotics   Social History   Tobacco Use  . Smoking status: Former Smoker    Quit date: 07/01/2014    Years since quitting: 5.2  . Smokeless tobacco: Never Used  . Tobacco comment: Uses nicotiene losenges  Vaping Use  . Vaping Use: Never used  Substance Use Topics  . Alcohol use: Yes    Alcohol/week: 1.0 standard drink    Types: 1 Cans of beer per week    Comment: rarely  . Drug use: No     Family Hx: The patient's family history includes Aneurysm in her father; Breast cancer in her maternal grandmother; Cerebral aneurysm (age of onset: 76)  in her father; Heart attack in her daughter, maternal grandfather, and mother; Heart failure in her mother; Hypertension in her brother, mother, and sister. There is no history of Stroke.  ROS   EKGs/Labs/Other Test Reviewed:    EKG:  EKG is  not ordered today.    Recent Labs: No results found for requested labs within last 8760 hours.   Recent Lipid Panel Lab Results  Component Value Date/Time   CHOL 128 01/04/2016 10:04 AM   TRIG 129 01/04/2016 10:04 AM   HDL 51 01/04/2016 10:04 AM   CHOLHDL 2.5 01/04/2016 10:04 AM   LDLCALC 51 01/04/2016 10:04 AM    Physical Exam:    VS:  BP 112/60 (BP Location: Right Arm, Patient Position: Sitting, Cuff Size: Large)   Pulse 81   Ht 5\' 2"  (1.575 m)   Wt 218 lb 6.4 oz (99.1 kg)   SpO2 94%   BMI 39.95 kg/m     Wt Readings from Last 3 Encounters:  10/02/19 218 lb 6.4 oz (99.1 kg)  08/29/19 216 lb (98 kg)  08/22/19 216 lb 9.6 oz (98.2 kg)     Constitutional:      Appearance: Healthy appearance. Not in distress.  Neck:     Thyroid: No thyromegaly.     Vascular: JVD normal.  Pulmonary:     Effort: Pulmonary effort is normal.     Breath sounds: No wheezing. No rales.  Cardiovascular:     Normal rate. Regular rhythm. Normal S1. Normal S2.     Murmurs: There is no murmur.  Edema:    Peripheral edema absent.  Abdominal:     Palpations: Abdomen is soft. There is no hepatomegaly.  Skin:    General: Skin is warm and dry.  Neurological:     Mental Status: Alert and oriented to person, place and time.     Cranial Nerves: Cranial nerves are intact.       ASSESSMENT & PLAN:    1. Coronary artery disease involving native coronary artery of native heart without angina pectoris S/p CABG in 2016.  She had a recent Myoview that was low risk.  She is not having anginal symptoms.  Continue atorvastatin, aspirin.  2. SOB (shortness of breath) She has chronic dyspnea with exertion.  She does not appear volume overloaded on exam.  I will  obtain a BNP today.  If this is significantly elevated, change HCTZ to furosemide.  She does have a long history of prior smoking.  I suspect her shortness of breath is likely related to underlying COPD.  She had CT scan recently for cancer screening.  This did demonstrate mild centrilobular and paraseptal emphysema.  We discussed the possibility of referral to pulmonology versus discussing with primary care.  She has follow-up with primary care in July and prefers to discuss with Dr. Felipa Eth at that time  3. Essential hypertension We checked her blood pressure with her machine while she was in the office today.  Her blood pressures on her machine are fairly close to the blood pressures obtained with our mercury cuff.  Her most recent blood pressures at home have been optimal.  Overall, blood pressure seems to be well controlled.  Continue current dose of amlodipine, hydrochlorothiazide and lisinopril.  She does note a chronic cough.  We discussed whether or not to change her ACE inhibitor to an ARB.  She prefers to hold off for now will let us know if she would like to change in the future.     Dispo:  Return in about 6 months (around 04/02/2020) for Routine Follow Up, w/ Dr. Harrington Challenger, in person.   Medication Adjustments/Labs and Tests Ordered: Current medicines are reviewed at length with the patient today.  Concerns regarding medicines are outlined above.  Tests Ordered: Orders Placed This Encounter  Procedures  . Pro b natriuretic peptide (BNP)   Medication Changes: No orders of the defined types were placed in this encounter.   Signed, Richardson Dopp, PA-C  10/02/2019 5:16 PM    [facility] [address], [place], [place]  [address] Phone: [phone]; Fax: [phone]

## 2019-10-02 ENCOUNTER — Encounter: Payer: Self-pay | Admitting: Physician Assistant

## 2019-10-02 ENCOUNTER — Other Ambulatory Visit: Payer: Self-pay

## 2019-10-02 ENCOUNTER — Ambulatory Visit: Payer: Medicare PPO | Admitting: Physician Assistant

## 2019-10-02 VITALS — BP 112/60 | HR 81 | Ht 62.0 in | Wt 218.4 lb

## 2019-10-02 DIAGNOSIS — R0602 Shortness of breath: Secondary | ICD-10-CM | POA: Diagnosis not present

## 2019-10-02 DIAGNOSIS — I2510 Atherosclerotic heart disease of native coronary artery without angina pectoris: Secondary | ICD-10-CM | POA: Diagnosis not present

## 2019-10-02 DIAGNOSIS — I10 Essential (primary) hypertension: Secondary | ICD-10-CM | POA: Diagnosis not present

## 2019-10-02 NOTE — Patient Instructions (Signed)
Medication Instructions:   Your physician recommends that you continue on your current medications as directed. Please refer to the Current Medication list given to you today.  *If you need a refill on your cardiac medications before your next appointment, please call your pharmacy*  Lab Work:  You will have labs drawn today: BNP  If you have labs (blood work) drawn today and your tests are completely normal, you will receive your results only by: Marland Kitchen MyChart Message (if you have MyChart) OR . A paper copy in the mail If you have any lab test that is abnormal or we need to change your treatment, we will call you to review the results.  Testing/Procedures:  None ordered today  Follow-Up: At [facility], you and your health needs are our priority.  As part of our continuing mission to provide you with exceptional heart care, we have created designated Provider Care Teams.  These Care Teams include your primary Cardiologist (physician) and Advanced Practice Providers (APPs -  Physician Assistants and Nurse Practitioners) who all work together to provide you with the care you need, when you need it.  On 03/20/20 at 11:00AM with Dorris Carnes, MD

## 2019-10-03 LAB — PRO B NATRIURETIC PEPTIDE: NT-Pro BNP: 31 pg/mL (ref 0–301)

## 2019-10-04 NOTE — Addendum Note (Signed)
Addended by: Briant Cedar on: 10/04/2019 10:27 AM   Modules accepted: Orders

## 2019-10-11 ENCOUNTER — Ambulatory Visit: Payer: Medicare Other | Admitting: Internal Medicine

## 2019-10-23 ENCOUNTER — Ambulatory Visit: Payer: Medicare Other | Admitting: Adult Health

## 2019-11-21 DIAGNOSIS — G4733 Obstructive sleep apnea (adult) (pediatric): Secondary | ICD-10-CM | POA: Diagnosis not present

## 2019-11-26 ENCOUNTER — Encounter: Payer: Self-pay | Admitting: Neurology

## 2019-11-26 ENCOUNTER — Ambulatory Visit: Payer: Medicare PPO | Admitting: Neurology

## 2019-11-26 VITALS — BP 126/74 | HR 84 | Ht 62.5 in | Wt 218.0 lb

## 2019-11-26 DIAGNOSIS — Z9989 Dependence on other enabling machines and devices: Secondary | ICD-10-CM | POA: Diagnosis not present

## 2019-11-26 DIAGNOSIS — G4733 Obstructive sleep apnea (adult) (pediatric): Secondary | ICD-10-CM | POA: Diagnosis not present

## 2019-11-26 NOTE — Progress Notes (Signed)
Subjective:    Patient ID: Hannah Nielsen is a 69 y.o. female.  HPI     Interim history:   [name] is a 69 year old right-handed woman with an underlying medical history of hypertension, ACA aneurysm (s/p coiling under Dr. [name] on 12/23/14), coronary artery disease, status post 2 vessel CABG on 07/10/2014, papillary fibroblastoma of the heart, stroke in March 2016, hyperlipidemia, recent smoking cessation, and obesity, who presents for follow-up consultation of her obstructive sleep apnea, established on CPAP therapy. The patient is unaccompanied today and presents for her yearly checkup. I last saw her on 11/06/2017, at which time she was fully compliant with her CPAP and doing well.  She saw Vaughan Browner, nurse practitioner in the interim on 10/25/2018, at which time she was fully compliant with her CPAP but was having trouble with dry mouth.  She was advised to try to increase the humidity setting on her machine and also consider using heated tubing.  She was advised to get in touch with her DME company about this.  Today, 11/26/2019: I reviewed her CPAP compliance data from 10/26/2019 through 11/24/2019, which is a total of 30 days, during which time she used her machine every night with percent use days greater than 4 hours at 100%, indicating superb compliance with an average usage of 7 hours and 16 minutes, residual AHI at goal at 3.7/h, leak on the low side with a 95th percentile at 6.4 L/min on a pressure of 9 cm with EPR of 2.  She reports doing well with her CPAP.  She is compliant with treatment.  She has some mouth dryness and drinks water through the night, about 2 glasses.  She is typically up-to-date with her supplies.  She is using a full facemask as she is a mouth breather.  She has been taking melatonin at night, 10 mg strength, she finds it effective.  She has been using melatonin for the past 6 months or so.  The patient's allergies, current medications, family history, past  medical history, past social history, past surgical history and problem list were reviewed and updated as appropriate.    Previously (copied from previous notes for reference):   I saw her on 01/31/2017, at which time she was fully compliant with CPAP and felt well. She was advised to follow-up in one year.    She saw Dr. Leonie Man in the interim on 04/24/2017. From the stroke standpoint she was deemed stable and advised to follow-up as needed.   I reviewed her CPAP compliance data from 10/03/2017 through 11/01/2017 which is a total of 30 days, during which time she used her CPAP every night with percent used days greater than 4 hours at 100%, indicating superb compliance with an average usage of 6 hours and 18 minutes, residual AHI 4.8 per hour, leak low with the 95th percentile at 5.7 L/m on a pressure of 9 cm with EPR of 2.       I saw her on 10/07/2015, at which time she was doing well. We reviewed recent blood work from her primary care physician's office. She was compliant with CPAP therapy. She was advised to follow-up in one year. She had some intermittent numbness and tingling of the left hand, able to shake it out.   She was seen in the interim by Ward Givens, nurse practitioner in January 2018 for her stroke follow-up and was advised to follow-up in one year, as she was stable.    I reviewed her CPAP compliance  data from 09/27/2016 through 10/26/2016 which is a total of 30 days, during which time she used her CPAP every night with percent used days greater than 4 hours at 100%, indicating superb compliance with an average usage of 6 hours and 31 minutes, residual AHI 1.7 per hour, leak low with the 95th percentile at 5.4 L/m on a pressure of 9 cm with EPR of 2.    I saw her on 04/16/2015, at which time we talked about her sleep test results from her baseline sleep study on 01/18/2015 as well as her CPAP titration study from 02/08/2015. She was doing well with CPAP therapy and was also  very pleased with her aneurysm coiling results. She felt improved with CPAP. She was fully compliant with treatment. She had no residual stroke related symptoms from March 2016. She quit smoking at the time of her stroke.    I reviewed her CPAP compliance data from 09/06/2015 through 10/05/2015, which is a total of 30 days, during which time she used her machine every night with percent used days greater than 4 hours at 93%, indicating excellent compliance with an average usage of 6 hours and 2 minutes, residual AHI 0.7 per hour, leak low with the 95th percentile at 11.6 L/m on a pressure of 9 cm with EPR of 2.   I first met her on 12/09/2014 at the request of Ward Givens and Dr. Leonie Man, at which time the patient reported snoring, witnessed apneic breathing pauses and excessive daytime somnolence. I invited her back for sleep study. She had a baseline sleep study, followed by a CPAP titration study. I went over her test results with her in detail today. Her baseline sleep study from 01/18/2015 showed a sleep efficiency of 57.4% with a latency to sleep of 24 minutes and wake after sleep onset of 139 minutes with mild to moderate sleep fragmentation noted. She had an elevated arousal index. She had moderate PLMS with an index of 33 per hour, resulting in 11.7 arousals per hour. She had an increased percentage of light stage sleep, absence of slow-wave sleep and a decreased percentage of REM sleep with a prolonged REM latency. Average oxygen saturation was 93%, nadir was 87%. She had mild to moderate snoring. Total AHI was 15.5 per hour. Based on her sleep-related complaints and her sleep study results, I invited her back for a full night CPAP titration study. She had this on 02/08/2015. Sleep efficiency was 76.4% with a latency to sleep of 40 minutes and wake after sleep onset of 68.5 minutes with mild to moderate sleep fragmentation noted. She had an arousal index of 10 per hour. She had an increased percentage  of stage II sleep, absence of slow-wave sleep, and a decreased percentage of REM sleep at 14.2% with a prolonged REM latency of 130 minutes. She had no significant PLMS, EKG or EEG changes. Average oxygen saturation was 92%, nadir was 89%. CPAP was titrated from 5 cm to 11 cm. AHI was 3.7 per hour on the pressure of 9 cm. She had evidence of increase in central apneas on pressure is being on 9 cm. Based on her test results are prescribed CPAP therapy for home use.   I reviewed her CPAP compliance data from 03/16/2015 through 04/14/2015 which is a total of 30 days during which time she used her machine every night with percent used days greater than 4 hours at 100%, indicating superb compliance with an average usage of 7 hours and 45 minutes, residual  AHI 2.4 per hour, leak low with the 95th percentile at 4.1 L/m on a pressure of 9 cm with EPR of 2.    12/09/2014: She reports snoring, witnessed apneas per daughter (recently shared a hotel room) and excessive daytime somnolence. As far as her stroke is concerned. She had woken up with a numbness and weakness of her left hand. Her weakness has improved and she has residual numbness in her left hand, lateral aspect. She is using nicotine lozenges and has quit smoking on 07/01/2014 at the time of her stroke diagnosis. She drinks alcohol very occasionally. She drinks coffee 2 cups a day and no sodas and typically no other caffeine-containing beverages. She tries to drink water. She has no known family history of obstructive sleep apnea but does have a family history of early or sudden death. Her father snored heavily. She reports a bedtime of around 9 or 10 PM and a rise time around 4:30 or 5. She typically does not wake up rested and her Epworth sleepiness score is elevated at 15 out of 24 today, her fatigue score is 42 out of 63. She lives alone. 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 She is divorced 2. She has 3 grown children from her first  marriage. She has a family history of brain aneurysm in her father who died when she was 89 years old from a ruptured brain aneurysm and his mother, her paternal grandmother had a brain aneurysm as well. Dr. Dorris Carnes is her cardiologist, Dr. Roxy Manns is her CT surgeon. She denies frank restless leg symptoms and is not known to twitch her legs and her sleep. She does wake up frequently in the middle of the night. She has to use the bathroom on average 2-3 times per night.   Her Past Medical History Is Significant For: Past Medical History:  Diagnosis Date  . Aneurysm of anterior cerebral artery: Right per MRI 07/01/14 07/02/2014   4.4 mm  . CAD (coronary artery disease), native coronary artery: Severe 2 vessel dz per cardiac cath 07/04/14 07/04/2014  . Cataract    left immature  . Constipation    takes Miralax daily as needed  . GERD (gastroesophageal reflux disease)    rarely takes Tums  . History of colon polyps    benign  . History of kidney stones   . Hyperlipidemia 07/02/2014   takes Lipitor daily  . Hypertension    takes Amlodipine and HCTZ daily  . Insomnia   . Left atrial mass 07/03/2014   benign papillary fibroelastoma  . Nocturia    takes Ditropan nightly  . Papillary fibroelastoma of heart   . S/P resection of left atrial benign papillary fibroelastoma and CABG x 2 07/10/2014   LIMA to LAD, SVG to RCA, EVH via right thigh  . Shortness of breath dyspnea    with exertion   . Sleep apnea    uses cpap  . Stroke ([place]) 0/73/7106   acute embolic stroke to right MCA territory  . Tingling    left hand,left breast,and left hip  . Urinary frequency   . Urinary urgency     Her Past Surgical History Is Significant For: Past Surgical History:  Procedure Laterality Date  . ABDOMINAL HYSTERECTOMY    . COLONOSCOPY    . CORONARY ARTERY BYPASS GRAFT N/A 07/10/2014   Procedure: CORONARY ARTERY BYPASS GRAFTING (CABG), ON PUMP, TIMES TWO, USING LEFT INTERNAL MAMMARY ARTERY, RIGHT GREATER  SAPHENOUS VEIN HARVESTED ENDOSCOPICALLY;  Surgeon:  Rexene Alberts, MD;  Location: [facility];  Service: Open Heart Surgery;  Laterality: N/A;  . EXCISION OF ATRIAL MYXOMA N/A 07/10/2014   Procedure: RESECTION OF LEFT ATRIAL MASS;  Surgeon: Rexene Alberts, MD;  Location: [facility];  Service: Open Heart Surgery;  Laterality: N/A;  . EXTRACORPOREAL SHOCK WAVE LITHOTRIPSY Left 04/13/2017   Procedure: LEFT EXTRACORPOREAL SHOCK WAVE LITHOTRIPSY (ESWL);  Surgeon: Bjorn Loser, MD;  Location: [facility];  Service: Urology;  Laterality: Left;  . LEFT HEART CATHETERIZATION WITH CORONARY ANGIOGRAM N/A 07/04/2014   Procedure: LEFT HEART CATHETERIZATION WITH CORONARY ANGIOGRAM;  Surgeon: Jettie Booze, MD;  Location: [facility];  Service: Cardiovascular;  Laterality: N/A;  . RADIOLOGY WITH ANESTHESIA N/A 12/23/2014   Procedure: Stent supported coil embolization OR right ACA aneurysm;  Surgeon: Consuella Lose, MD;  Location: [facility];  Service: Radiology;  Laterality: N/A;  . REPAIR OF PATENT FORAMEN OVALE N/A 07/10/2014   Procedure: REPAIR OF PATENT FORAMEN OVALE;  Surgeon: Rexene Alberts, MD;  Location: [facility];  Service: Open Heart Surgery;  Laterality: N/A;  . TEE WITHOUT CARDIOVERSION N/A 07/03/2014   Procedure: TRANSESOPHAGEAL ECHOCARDIOGRAM (TEE);  Surgeon: Jerline Pain, MD;  Location: [facility];  Service: Cardiovascular;  Laterality: N/A;  . TEE WITHOUT CARDIOVERSION N/A 07/10/2014   Procedure: TRANSESOPHAGEAL ECHOCARDIOGRAM (TEE);  Surgeon: Rexene Alberts, MD;  Location: [facility];  Service: Open Heart Surgery;  Laterality: N/A;    Her Family History Is Significant For: Family History  Problem Relation Age of Onset  . Hypertension Mother   . Heart failure Mother        Deceased  . Heart attack Mother   . Cerebral aneurysm Father 69       Deceased  . Aneurysm Father   . Breast cancer Maternal Grandmother   . Heart attack Maternal Grandfather   . Hypertension Sister   . Hypertension Brother    . Heart attack Daughter   . Stroke Neg Hx     Her Social History Is Significant For: Social History   Socioeconomic History  . Marital status: Divorced    Spouse name: Not on file  . Number of children: 3  . Years of education: college  . Highest education level: Not on file  Occupational History    Comment: retired Pharmacist, hospital  Tobacco Use  . Smoking status: Former Smoker    Quit date: 07/01/2014    Years since quitting: 5.4  . Smokeless tobacco: Never Used  . Tobacco comment: Uses nicotiene losenges  Vaping Use  . Vaping Use: Never used  Substance and Sexual Activity  . Alcohol use: Yes    Alcohol/week: 1.0 standard drink    Types: 1 Cans of beer per week    Comment: rarely  . Drug use: No  . Sexual activity: Not on file  Other Topics Concern  . Not on file  Social History Narrative   Patient lives at home alone and she is divorced.   Retired Pharmacist, hospital.   Education college.   Right handed.   Caffeine coffee two cups.   Social Determinants of Health   Financial Resource Strain:   . Difficulty of Paying Living Expenses:   Food Insecurity:   . Worried About Charity fundraiser in the Last Year:   . Arboriculturist in the Last Year:   Transportation Needs:   . Film/video editor (Medical):   Marland Kitchen Lack of Transportation (Non-Medical):   Physical Activity:   .  Days of Exercise per Week:   . Minutes of Exercise per Session:   Stress:   . Feeling of Stress :   Social Connections:   . Frequency of Communication with Friends and Family:   . Frequency of Social Gatherings with Friends and Family:   . Attends Religious Services:   . Active Member of Clubs or Organizations:   . Attends Archivist Meetings:   Marland Kitchen Marital Status:     Her Allergies Are:  Allergies  Allergen Reactions  . Sulfa Antibiotics     Childhood allergy - unknown  :   Her Current Medications Are:  Outpatient Encounter Medications as of 11/26/2019  Medication Sig  . amLODipine  (NORVASC) 5 MG tablet Take 1 tablet (5 mg total) by mouth daily.  . ASPIRIN 81 PO Take 81 mg by mouth daily.   Marland Kitchen atorvastatin (LIPITOR) 40 MG tablet Take 1 tablet (40 mg total) by mouth daily at 6 PM.  . CALCIUM PO Take 2 tablets by mouth daily.  . Cholecalciferol (VITAMIN D) 50 MCG (2000 UT) tablet Take 2,000 Units by mouth daily. OTC  . hydrochlorothiazide (MICROZIDE) 12.5 MG capsule TAKE 1 CAPSULE(12.5 MG) BY MOUTH DAILY.  Marland Kitchen KRILL OIL PO Take 1 capsule by mouth daily.  Marland Kitchen lisinopril (ZESTRIL) 10 MG tablet Take 1 tablet (10 mg total) by mouth daily.  Marland Kitchen MELATONIN PO Take by mouth.  . Multiple Vitamin (MULTIVITAMIN) tablet Take 1 tablet by mouth daily.  . nicotine polacrilex (COMMIT) 2 MG lozenge Take 2 mg by mouth as needed for smoking cessation.  . polyethylene glycol (MIRALAX) packet Take 17 g by mouth daily.   No facility-administered encounter medications on file as of 11/26/2019.  :  Review of Systems:  Out of a complete 14 point review of systems, all are reviewed and negative with the exception of these symptoms as listed below: Review of Systems  Neurological:       Here for f/u on cpap. Pt reports cpap is going well, no issues. She is currently using [employer] as DME.    Objective:  Neurological Exam  Physical Exam Physical Examination:   Vitals:   11/26/19 1039  BP: 126/74  Pulse: 84  SpO2: 94%    General Examination: The patient is a very pleasant 69 y.o. female in no acute distress. She appears well-developed and well-nourished and well groomed.   HEENT:Normocephalic, atraumatic, pupils are equal, round and reactive to light, tracking is good without limitation to gaze excursion or nystagmus noted. Hearing is grossly intact. Face is symmetric with normal facial animation. Speech is clear with no dysarthria noted. There is no hypophonia. There is no lip, neck/head, jaw or voice tremor. Neck with FROM. Oropharynx exam reveals: mild to moderate mouth dryness, adequate  dental hygiene and moderate airway crowding. Tonsils are small. Mallampati is class III. Tongue protrudes centrally and palate elevates symmetrically.   Chest:Clear to auscultation without wheezing, rhonchi or crackles noted.  Heart:S1+S2+0, regular and normal without murmurs, rubs or gallops noted.   Abdomen:Soft, non-tender and non-distended with normal bowel sounds appreciated on auscultation.  Extremities:There is no pitting edema in the distal lower extremities bilaterally.  Skin: Warm and dry without trophic changes noted.  Musculoskeletal: exam reveals no obvious joint deformities, tenderness or joint swelling or erythema.   Neurologically:  Mental status: The patient is awake, alert and oriented in all 4 spheres. Her immediate and remote memory, attention, language skills and fund of knowledge are appropriate. There is no  evidence of aphasia, agnosia, apraxia or anomia. Speech is clear with normal prosody and enunciation. Thought process is linear. Mood is normal and affect is normal.  Cranial nerves II - XII are as described above under HEENT exam. Motor exam: Normal bulk, strength and tone is noted. Romberg is negative. Fine motor skills and coordination:grossly intact.  Cerebellar testing: No dysmetria or intention tremor on finger to nose testing. There is no truncal or gait ataxia.  Sensory exam: intact to light touch. Gait, station and balance: She stands easily. No veering to one side is noted. No leaning to one side is noted. Posture is age-appropriate and stance is narrow based. Gait shows normal stride length and normal pace. No problems turning are noted.   Assessment and plan:   In summary, Jacari Kirsten is a very pleasant 69 year old female with an underlying medical history of hypertension, ACA aneurysm(s/p elective stent-supported coil embolization under Dr. [name] on 12/23/14),coronary artery disease(s/p 2 vessel CABG on 07/10/2014), papillary  fibroblastoma of the heart, stroke in March 2016, hyperlipidemia, prior smoking, history of kidney stone, and obesity, whopresents for follow-up consultation of her obstructive sleep apnea, established on CPAP at 9 cm with ongoing excellent compliance. She had a baseline sleep study and CPAP titration study, both in October 2016. She has moderate sleep apnea and is well treated with her CPAP settings. She is commended for her excellent treatment adherence.  She quit smoking after her stroke.  Her weight has been stable.  Her physical exam has been stable as well.  She is advised to follow-up routinely in 1 year to see the nurse practitioner for sleep apnea.  I renewed her prescription for CPAP supplies, her DME company is [employer]. I answered all her questions today and she was in agreement. I spent 20 minutes in total face-to-face time and in reviewing records during pre-charting, more than 50% of which was spent in counseling and coordination of care, reviewing test results, reviewing medications and treatment regimen and/or in discussing or reviewing the diagnosis of OSA, the prognosis and treatment options. Pertinent laboratory and imaging test results that were available during this visit with the patient were reviewed by me and considered in my medical decision making (see chart for details).

## 2019-11-26 NOTE — Patient Instructions (Addendum)
It was good to see you again today.  You are fully compliant with your CPAP and I am glad that you are doing well with it.  Keep up the good work! We can see you in 1 year, you can see one of our nurse practitioners as you are stable.

## 2019-12-11 DIAGNOSIS — Z6838 Body mass index (BMI) 38.0-38.9, adult: Secondary | ICD-10-CM | POA: Diagnosis not present

## 2019-12-11 DIAGNOSIS — I10 Essential (primary) hypertension: Secondary | ICD-10-CM | POA: Diagnosis not present

## 2019-12-11 DIAGNOSIS — Z0000 Encounter for general adult medical examination without abnormal findings: Secondary | ICD-10-CM | POA: Diagnosis not present

## 2019-12-11 DIAGNOSIS — Z79899 Other long term (current) drug therapy: Secondary | ICD-10-CM | POA: Diagnosis not present

## 2019-12-11 DIAGNOSIS — D72829 Elevated white blood cell count, unspecified: Secondary | ICD-10-CM | POA: Diagnosis not present

## 2019-12-11 DIAGNOSIS — I700 Atherosclerosis of aorta: Secondary | ICD-10-CM | POA: Diagnosis not present

## 2019-12-11 DIAGNOSIS — E7800 Pure hypercholesterolemia, unspecified: Secondary | ICD-10-CM | POA: Diagnosis not present

## 2019-12-11 DIAGNOSIS — Z1389 Encounter for screening for other disorder: Secondary | ICD-10-CM | POA: Diagnosis not present

## 2020-01-15 DIAGNOSIS — D72829 Elevated white blood cell count, unspecified: Secondary | ICD-10-CM | POA: Diagnosis not present

## 2020-01-29 DIAGNOSIS — Z23 Encounter for immunization: Secondary | ICD-10-CM | POA: Diagnosis not present

## 2020-03-19 NOTE — Progress Notes (Signed)
Cardiology Office Note   Date:  03/20/2020   ID:  Hannah Nielsen, DOB [birth year], MRN [id number]  PCP:  Lajean Manes, MD  Cardiologist:   Dorris Carnes, MD   F/U of CAD and HTN    History of Present Illness: Hannah Nielsen is a 69 y.o. female with a history ofHarriet Nielsen is a 69 y.o. female with with a history of HTN, CVA, L atrial fibroelastoma and R ACA aneurysm.   Pt with CAD  Undwent removal of fibrolastoma and CABG (LIMA to LAD; SVG to RCA) as well as PFO closure  All done in 2016  I saw the pt last spring   She was seen by  Kathleen Argue in Jun 2021 Since seen she denies CP  STill gets SOB when she does things  No PND  No edema  Diet Breakfast  Skip or cereal Lunch  Sandwich or left over Dinner  Past  Current Meds  Medication Sig  . amLODipine (NORVASC) 5 MG tablet Take 1 tablet (5 mg total) by mouth daily.  . ASPIRIN 81 PO Take 81 mg by mouth daily.   Marland Kitchen atorvastatin (LIPITOR) 40 MG tablet Take 1 tablet (40 mg total) by mouth daily at 6 PM.  . CALCIUM PO Take 2 tablets by mouth daily.  . Cholecalciferol (VITAMIN D) 50 MCG (2000 UT) tablet Take 2,000 Units by mouth daily. OTC  . hydrochlorothiazide (MICROZIDE) 12.5 MG capsule TAKE 1 CAPSULE(12.5 MG) BY MOUTH DAILY.  Marland Kitchen KRILL OIL PO Take 1 capsule by mouth daily.  Marland Kitchen lisinopril (ZESTRIL) 10 MG tablet Take 1 tablet (10 mg total) by mouth daily.  Marland Kitchen MELATONIN PO Take 10 mg by mouth daily.  . Multiple Vitamin (MULTIVITAMIN) tablet Take 1 tablet by mouth daily.  . nicotine polacrilex (COMMIT) 2 MG lozenge Take 2 mg by mouth as needed for smoking cessation.  . polyethylene glycol (MIRALAX / GLYCOLAX) 17 g packet Take 17 g by mouth daily.     Allergies:   Sulfa antibiotics   Past Medical History:  Diagnosis Date  . Aneurysm of anterior cerebral artery: Right per MRI 07/01/14 07/02/2014   4.4 mm  . CAD (coronary artery disease), native coronary artery: Severe 2 vessel dz per cardiac cath 07/04/14 07/04/2014  . Cataract    left  immature  . Constipation    takes Miralax daily as needed  . GERD (gastroesophageal reflux disease)    rarely takes Tums  . History of colon polyps    benign  . History of kidney stones   . Hyperlipidemia 07/02/2014   takes Lipitor daily  . Hypertension    takes Amlodipine and HCTZ daily  . Insomnia   . Left atrial mass 07/03/2014   benign papillary fibroelastoma  . Nocturia    takes Ditropan nightly  . Papillary fibroelastoma of heart   . S/P resection of left atrial benign papillary fibroelastoma and CABG x 2 07/10/2014   LIMA to LAD, SVG to RCA, EVH via right thigh  . Shortness of breath dyspnea    with exertion   . Sleep apnea    uses cpap  . Stroke ([place]) 5/95/6387   acute embolic stroke to right MCA territory  . Tingling    left hand,left breast,and left hip  . Urinary frequency   . Urinary urgency     Past Surgical History:  Procedure Laterality Date  . ABDOMINAL HYSTERECTOMY    . COLONOSCOPY    . CORONARY ARTERY BYPASS GRAFT N/A 07/10/2014  Procedure: CORONARY ARTERY BYPASS GRAFTING (CABG), ON PUMP, TIMES TWO, USING LEFT INTERNAL MAMMARY ARTERY, RIGHT GREATER SAPHENOUS VEIN HARVESTED ENDOSCOPICALLY;  Surgeon: Rexene Alberts, MD;  Location: [facility];  Service: Open Heart Surgery;  Laterality: N/A;  . EXCISION OF ATRIAL MYXOMA N/A 07/10/2014   Procedure: RESECTION OF LEFT ATRIAL MASS;  Surgeon: Rexene Alberts, MD;  Location: [facility];  Service: Open Heart Surgery;  Laterality: N/A;  . EXTRACORPOREAL SHOCK WAVE LITHOTRIPSY Left 04/13/2017   Procedure: LEFT EXTRACORPOREAL SHOCK WAVE LITHOTRIPSY (ESWL);  Surgeon: Bjorn Loser, MD;  Location: [facility];  Service: Urology;  Laterality: Left;  . LEFT HEART CATHETERIZATION WITH CORONARY ANGIOGRAM N/A 07/04/2014   Procedure: LEFT HEART CATHETERIZATION WITH CORONARY ANGIOGRAM;  Surgeon: Jettie Booze, MD;  Location: [facility] C[facility];  Service: Cardiovascular;  Laterality: N/A;  . RADIOLOGY WITH ANESTHESIA N/A 12/23/2014   Procedure:  Stent supported coil embolization OR right ACA aneurysm;  Surgeon: Consuella Lose, MD;  Location: [facility];  Service: Radiology;  Laterality: N/A;  . REPAIR OF PATENT FORAMEN OVALE N/A 07/10/2014   Procedure: REPAIR OF PATENT FORAMEN OVALE;  Surgeon: Rexene Alberts, MD;  Location: [facility];  Service: Open Heart Surgery;  Laterality: N/A;  . TEE WITHOUT CARDIOVERSION N/A 07/03/2014   Procedure: TRANSESOPHAGEAL ECHOCARDIOGRAM (TEE);  Surgeon: Jerline Pain, MD;  Location: [facility];  Service: Cardiovascular;  Laterality: N/A;  . TEE WITHOUT CARDIOVERSION N/A 07/10/2014   Procedure: TRANSESOPHAGEAL ECHOCARDIOGRAM (TEE);  Surgeon: Rexene Alberts, MD;  Location: [facility];  Service: Open Heart Surgery;  Laterality: N/A;     Social History:  The patient  reports that she quit smoking about 5 years ago. She has never used smokeless tobacco. She reports current alcohol use of about 1.0 standard drink of alcohol per week. She reports that she does not use drugs.   Family History:  The patient's family history includes Aneurysm in her father; Breast cancer in her maternal grandmother; Cerebral aneurysm (age of onset: 51) in her father; Heart attack in her daughter, maternal grandfather, and mother; Heart failure in her mother; Hypertension in her brother, mother, and sister.    ROS:  Please see the history of present illness. All other systems are reviewed and  Negative to the above problem except as noted.    PHYSICAL EXAM: VS:  BP 134/72   Pulse 84   Ht 5' 2.5" (1.588 m)   Wt 218 lb (98.9 kg)   SpO2 94%   BMI 39.24 kg/m   GEN: Morbidly obese 69 yo  in no acute distress  HEENT: normal  Neck: no JVD,Cardiac: RRR; no murmurs,  ,no edema  Respiratory:  clear to auscultation bilaterally,  GI: soft, nontender, nondistended, + BS  No hepatomegaly  MS: no deformity Moving all extremities   Skin: warm and dry, no rash Neuro:  Strength and sensation are intact Psych: euthymic mood, full  affect   EKG:  EKG is not ordered today.    Lipid Panel    Component Value Date/Time   CHOL 128 01/04/2016 1004   TRIG 129 01/04/2016 1004   HDL 51 01/04/2016 1004   CHOLHDL 2.5 01/04/2016 1004   VLDL 26 01/04/2016 1004   LDLCALC 51 01/04/2016 1004      Wt Readings from Last 3 Encounters:  03/20/20 218 lb (98.9 kg)  11/26/19 218 lb (98.9 kg)  10/02/19 218 lb 6.4 oz (99.1 kg)      ASSESSMENT AND PLAN:  1  CAD  CABG in 2016 (LIMA to LAD; SVG to RCA)  No symptoms of angina  I am not convinced dyspnea is anginal equiv  2.  HTN  BP is OK  3 HL  Discussed diet  Will check lipids in March   Trig were hgh on last lipid panel  Check A1C  4  Obesity  discussed diet and TRE  F/U in 6 months   Current medicines are reviewed at length with the patient today.  The patient does not have concerns regarding medicines.  Signed, Dorris Carnes, MD  03/20/2020 11:55 AM    [address], [place], [place]  [address] Phone: [phone]; Fax: [phone]

## 2020-03-20 ENCOUNTER — Other Ambulatory Visit: Payer: Self-pay

## 2020-03-20 ENCOUNTER — Encounter: Payer: Self-pay | Admitting: Internal Medicine

## 2020-03-20 ENCOUNTER — Ambulatory Visit: Payer: Medicare PPO | Admitting: Internal Medicine

## 2020-03-20 VITALS — BP 134/72 | HR 84 | Ht 62.5 in | Wt 218.0 lb

## 2020-03-20 DIAGNOSIS — I2510 Atherosclerotic heart disease of native coronary artery without angina pectoris: Secondary | ICD-10-CM

## 2020-03-20 DIAGNOSIS — E782 Mixed hyperlipidemia: Secondary | ICD-10-CM | POA: Diagnosis not present

## 2020-03-20 DIAGNOSIS — E781 Pure hyperglyceridemia: Secondary | ICD-10-CM | POA: Diagnosis not present

## 2020-03-20 NOTE — Patient Instructions (Signed)
Medication Instructions:  No changes *If you need a refill on your cardiac medications before your next appointment, please call your pharmacy*   Lab Work: In March - return for fasting labs (lipids, hgA1c)  If you have labs (blood work) drawn today and your tests are completely normal, you will receive your results only by: Marland Kitchen MyChart Message (if you have MyChart) OR . A paper copy in the mail If you have any lab test that is abnormal or we need to change your treatment, we will call you to review the results.   Testing/Procedures: none   Follow-Up: At [facility], you and your health needs are our priority.  As part of our continuing mission to provide you with exceptional heart care, we have created designated Provider Care Teams.  These Care Teams include your primary Cardiologist (physician) and Advanced Practice Providers (APPs -  Physician Assistants and Nurse Practitioners) who all work together to provide you with the care you need, when you need it.   Your next appointment:   6 month(s)  The format for your next appointment:   In Person  Provider:   You may see Dorris Carnes, MD or one of the following Advanced Practice Providers on your designated Care Team:    Richardson Dopp, PA-C  Robbie Lis, [place]   Other Instructions

## 2020-03-23 DIAGNOSIS — Z1231 Encounter for screening mammogram for malignant neoplasm of breast: Secondary | ICD-10-CM | POA: Diagnosis not present

## 2020-04-16 ENCOUNTER — Other Ambulatory Visit: Payer: Medicare PPO

## 2020-04-16 DIAGNOSIS — Z20822 Contact with and (suspected) exposure to covid-19: Secondary | ICD-10-CM | POA: Diagnosis not present

## 2020-04-18 LAB — NOVEL CORONAVIRUS, NAA: SARS-CoV-2, NAA: NOT DETECTED

## 2020-04-18 LAB — SARS-COV-2, NAA 2 DAY TAT

## 2020-06-05 DIAGNOSIS — H2513 Age-related nuclear cataract, bilateral: Secondary | ICD-10-CM | POA: Diagnosis not present

## 2020-06-05 DIAGNOSIS — H52203 Unspecified astigmatism, bilateral: Secondary | ICD-10-CM | POA: Diagnosis not present

## 2020-06-09 DIAGNOSIS — Z6838 Body mass index (BMI) 38.0-38.9, adult: Secondary | ICD-10-CM | POA: Diagnosis not present

## 2020-06-09 DIAGNOSIS — I10 Essential (primary) hypertension: Secondary | ICD-10-CM | POA: Diagnosis not present

## 2020-06-09 DIAGNOSIS — Z79899 Other long term (current) drug therapy: Secondary | ICD-10-CM | POA: Diagnosis not present

## 2020-07-04 ENCOUNTER — Other Ambulatory Visit: Payer: Self-pay | Admitting: Internal Medicine

## 2020-07-15 ENCOUNTER — Other Ambulatory Visit: Payer: Medicare PPO

## 2020-07-16 ENCOUNTER — Other Ambulatory Visit: Payer: Self-pay

## 2020-07-16 ENCOUNTER — Other Ambulatory Visit: Payer: Medicare PPO

## 2020-07-16 DIAGNOSIS — E782 Mixed hyperlipidemia: Secondary | ICD-10-CM

## 2020-07-16 DIAGNOSIS — E781 Pure hyperglyceridemia: Secondary | ICD-10-CM | POA: Diagnosis not present

## 2020-07-16 DIAGNOSIS — I2510 Atherosclerotic heart disease of native coronary artery without angina pectoris: Secondary | ICD-10-CM

## 2020-07-17 LAB — LIPID PANEL
Chol/HDL Ratio: 3.1 ratio (ref 0.0–4.4)
Cholesterol, Total: 120 mg/dL (ref 100–199)
HDL: 39 mg/dL — ABNORMAL LOW (ref >39)
LDL Chol Calc (NIH): 56 mg/dL (ref 0–99)
Triglycerides: 142 mg/dL (ref 0–149)
VLDL Cholesterol Cal: 25 mg/dL (ref 5–40)

## 2020-07-17 LAB — HEMOGLOBIN A1C
Est. average glucose Bld gHb Est-mCnc: 123 mg/dL
Hgb A1c MFr Bld: 5.9 % — ABNORMAL HIGH (ref 4.8–5.6)

## 2020-08-06 ENCOUNTER — Telehealth: Payer: Self-pay | Admitting: Neurology

## 2020-08-06 DIAGNOSIS — G4733 Obstructive sleep apnea (adult) (pediatric): Secondary | ICD-10-CM

## 2020-08-06 NOTE — Telephone Encounter (Signed)
Pt states her CPAP is no longer transmitting data because of it being 3G. Pt has been told that she qualifies for a new CPAP with 5G.  Pt states her DME is telling her Dr Rexene Alberts needs to submit the proper documents for this to be done, please call

## 2020-08-06 NOTE — Addendum Note (Signed)
Addended by: Verlin Grills on: 08/06/2020 09:49 AM   Modules accepted: Orders

## 2020-08-06 NOTE — Telephone Encounter (Addendum)
Attached recent D/L for reference.  Pt is compliant with CPAP. Per res med her start date was 02/18/2015 and is eligible for a new machine.   Pt has also been compliant with her f/u. Will place order for CPAP pressure settings same as previous machine. Order sent to DME and pt notified. Pt made aware of the shortage of CPAP machines and start dates can be 6-12 weeks out.  Pt will call back once she is started on her new machine so we can schedule her 31-90 f/u.

## 2020-08-25 ENCOUNTER — Other Ambulatory Visit: Payer: Self-pay | Admitting: Internal Medicine

## 2020-09-08 DIAGNOSIS — G4733 Obstructive sleep apnea (adult) (pediatric): Secondary | ICD-10-CM | POA: Diagnosis not present

## 2020-09-10 ENCOUNTER — Other Ambulatory Visit: Payer: Self-pay | Admitting: Internal Medicine

## 2020-09-28 DIAGNOSIS — Z86010 Personal history of colonic polyps: Secondary | ICD-10-CM | POA: Diagnosis not present

## 2020-09-28 DIAGNOSIS — K5900 Constipation, unspecified: Secondary | ICD-10-CM | POA: Diagnosis not present

## 2020-09-30 ENCOUNTER — Other Ambulatory Visit: Payer: Self-pay | Admitting: Internal Medicine

## 2020-11-26 ENCOUNTER — Ambulatory Visit: Payer: Medicare PPO | Admitting: Adult Health

## 2020-12-04 ENCOUNTER — Ambulatory Visit: Payer: Medicare PPO | Admitting: Internal Medicine

## 2020-12-04 ENCOUNTER — Other Ambulatory Visit: Payer: Self-pay

## 2020-12-04 ENCOUNTER — Encounter: Payer: Self-pay | Admitting: Internal Medicine

## 2020-12-04 VITALS — BP 116/64 | HR 73 | Ht 62.5 in | Wt 213.0 lb

## 2020-12-04 DIAGNOSIS — R0602 Shortness of breath: Secondary | ICD-10-CM

## 2020-12-04 NOTE — Progress Notes (Signed)
Cardiology Office Note   Date:  12/04/2020   ID:  Hannah Nielsen, DOB [birth year], MRN [id number]  PCP:  Hannah Manes, MD  Cardiologist:   Hannah Carnes, MD   F/U of CAD and HTN    History of Present Illness: Hannah Nielsen is a 70 y.o. 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   Pt with CAD  Undwent removal of fibrolastoma and CABG (LIMA to LAD; SVG to RCA) as well as PFO closure  All done in 2016  I saw the pt in Dec 2021  Doing good at the time    The pt deneis CP   She does say that with walking distances hse has to stop an catch her breath    She cuts back on activity at times because she is afraid of gietting SOB and being far from anything    Denies PND   No edema  Cares for grandkids       Current Meds  Medication Sig   amLODipine (NORVASC) 5 MG tablet TAKE 1 TABLET BY MOUTH EVERY DAY   ASPIRIN 81 PO Take 81 mg by mouth daily.    atorvastatin (LIPITOR) 40 MG tablet TAKE 1 TABLET BY MOUTH EVERY DAY AT 6 PM   CALCIUM PO Take 2 tablets by mouth daily.   Cholecalciferol (VITAMIN D) 50 MCG (2000 UT) tablet Take 2,000 Units by mouth daily. OTC   hydrochlorothiazide (MICROZIDE) 12.5 MG capsule TAKE 1 CAPSULE(12.5 MG) BY MOUTH DAILY.   KRILL OIL PO Take 1 capsule by mouth daily.   lisinopril (ZESTRIL) 10 MG tablet TAKE 1 TABLET BY MOUTH EVERY DAY   MELATONIN PO Take 10 mg by mouth daily.   Multiple Vitamin (MULTIVITAMIN) tablet Take 1 tablet by mouth daily.   nicotine polacrilex (COMMIT) 2 MG lozenge Take 2 mg by mouth as needed for smoking cessation.   OVER THE COUNTER MEDICATION Take by mouth in the morning and at bedtime. OSTEOBIFLEX W/ TUMERIC   polyethylene glycol (MIRALAX / GLYCOLAX) 17 g packet Take 17 g by mouth daily.     Allergies:   Sulfa antibiotics   Past Medical History:  Diagnosis Date   Aneurysm of anterior cerebral artery: Right per MRI 07/01/14 07/02/2014   4.4 mm   CAD (coronary artery disease), native coronary artery:  Severe 2 vessel dz per cardiac cath 07/04/14 07/04/2014   Cataract    left immature   Constipation    takes Miralax daily as needed   GERD (gastroesophageal reflux disease)    rarely takes Tums   History of colon polyps    benign   History of kidney stones    Hyperlipidemia 07/02/2014   takes Lipitor daily   Hypertension    takes Amlodipine and HCTZ daily   Insomnia    Left atrial mass 07/03/2014   benign papillary fibroelastoma   Nocturia    takes Ditropan nightly   Papillary fibroelastoma of heart    S/P resection of left atrial benign papillary fibroelastoma and CABG x 2 07/10/2014   LIMA to LAD, SVG to RCA, EVH via right thigh   Shortness of breath dyspnea    with exertion    Sleep apnea    uses cpap   Stroke ([place]) 0000000   acute embolic stroke to right MCA territory   Tingling    left hand,left breast,and left hip   Urinary frequency    Urinary urgency     Past Surgical History:  Procedure Laterality Date   ABDOMINAL HYSTERECTOMY     COLONOSCOPY     CORONARY ARTERY BYPASS GRAFT N/A 07/10/2014   Procedure: CORONARY ARTERY BYPASS GRAFTING (CABG), ON PUMP, TIMES TWO, USING LEFT INTERNAL MAMMARY ARTERY, RIGHT GREATER SAPHENOUS VEIN HARVESTED ENDOSCOPICALLY;  Surgeon: Rexene Alberts, MD;  Location: [facility];  Service: Open Heart Surgery;  Laterality: N/A;   EXCISION OF ATRIAL MYXOMA N/A 07/10/2014   Procedure: RESECTION OF LEFT ATRIAL MASS;  Surgeon: Rexene Alberts, MD;  Location: [facility];  Service: Open Heart Surgery;  Laterality: N/A;   EXTRACORPOREAL SHOCK WAVE LITHOTRIPSY Left 04/13/2017   Procedure: LEFT EXTRACORPOREAL SHOCK WAVE LITHOTRIPSY (ESWL);  Surgeon: Bjorn Loser, MD;  [facility];  Service: Urology;  Laterality: Left;   LEFT HEART CATHETERIZATION WITH CORONARY ANGIOGRAM N/A 07/04/2014   Procedure: LEFT HEART CATHETERIZATION WITH CORONARY ANGIOGRAM;  Surgeon: Jettie Booze, MD;  Location: [facility] CATH LAB;  Service: Cardiovascular;  Laterality: N/A;    RADIOLOGY WITH ANESTHESIA N/A 12/23/2014   Procedure: Stent supported coil embolization OR right ACA aneurysm;  Surgeon: Consuella Lose, MD;  Location: [facility];  Service: Radiology;  Laterality: N/A;   REPAIR OF PATENT FORAMEN OVALE N/A 07/10/2014   Procedure: REPAIR OF PATENT FORAMEN OVALE;  Surgeon: Rexene Alberts, MD;  Location: [facility];  Service: Open Heart Surgery;  Laterality: N/A;   TEE WITHOUT CARDIOVERSION N/A 07/03/2014   Procedure: TRANSESOPHAGEAL ECHOCARDIOGRAM (TEE);  Surgeon: Jerline Pain, MD;  Location: [facility];  Service: Cardiovascular;  Laterality: N/A;   TEE WITHOUT CARDIOVERSION N/A 07/10/2014   Procedure: TRANSESOPHAGEAL ECHOCARDIOGRAM (TEE);  Surgeon: Rexene Alberts, MD;  Location: [facility];  Service: Open Heart Surgery;  Laterality: N/A;     Social History:  The patient  reports that she quit smoking about 6 years ago. Her smoking use included cigarettes. She has never used smokeless tobacco. She reports current alcohol use of about 1.0 standard drink per week. She reports that she does not use drugs.   Family History:  The patient's family history includes Aneurysm in her father; Breast cancer in her maternal grandmother; Cerebral aneurysm (age of onset: 53) in her father; Heart attack in her daughter, maternal grandfather, and mother; Heart failure in her mother; Hypertension in her brother, mother, and sister.    ROS:  Please see the history of present illness. All other systems are reviewed and  Negative to the above problem except as noted.    PHYSICAL EXAM: VS:  BP 116/64   Pulse 73   Ht 5' 2.5" (1.588 m)   Wt 213 lb (96.6 kg)   BMI 38.34 kg/m   GEN: Morbidly obese 70 yo  in no acute distress  HEENT: normal  Neck: no JVD,Cardiac: RRR; no murmurs,  ,no edema  Respiratory:  clear to auscultation bilaterally, No rales   GI: soft, nontender, nondistended, + BS  No hepatomegaly  MS: no deformity Moving all extremities   Skin: warm and dry, no  rash Neuro:  Strength and sensation are intact Psych: euthymic mood, full affect   EKG:  EKG is ordered today.    SR 73 bpm   Lipid Panel    Component Value Date/Time   CHOL 120 07/16/2020 0926   TRIG 142 07/16/2020 0926   HDL 39 (L) 07/16/2020 0926   CHOLHDL 3.1 07/16/2020 0926   CHOLHDL 2.5 01/04/2016 1004   VLDL 26 01/04/2016 1004   LDLCALC 56 07/16/2020 0926      Wt  Readings from Last 3 Encounters:  12/04/20 213 lb (96.6 kg)  03/20/20 218 lb (98.9 kg)  11/26/19 218 lb (98.9 kg)      ASSESSMENT AND PLAN:  1  CAD   CABG in 2016 (LIMA to LAD; SVG to RCA)  No symptoms of angina Still with dypsnea ? Microvascular issues   ? Diastolic dysfunction  Will get echo to evaluate diastolic function     ? Jardiance trial   2.  HTN  BP is controlled on current regimen  Continue      3 HL  Lipids are excellent   Follow      4  Obesity  discussed diet and TRE  She says she is working on   Granddaughter is living with her and is a healthy eater   F/U in 6 months   Current medicines are reviewed at length with the patient today.  The patient does not have concerns regarding medicines.  Signed, Hannah Carnes, MD  12/04/2020 8:08 AM    [facility] [place], [place], [place]  [address] Phone: [phone]; Fax: [phone]

## 2020-12-04 NOTE — Patient Instructions (Signed)
Medication Instructions:   Your physician recommends that you continue on your current medications as directed. Please refer to the Current Medication list given to you today.   *If you need a refill on your cardiac medications before your next appointment, please call your pharmacy*   Lab Work: [place]   If you have labs (blood work) drawn today and your tests are completely normal, you will receive your results only by: [place] (if you have MyChart) OR A paper copy in the mail If you have any lab test that is abnormal or we need to change your treatment, we will call you to review the results.   Testing/Procedures: FOR PATIENT TO DECIDE WHEN  TO COMPLETE  the  physician request of  echocardiogram. Echocardiography is a painless test that uses sound waves to create images of your heart. It provides your doctor with information about the size and shape of your heart and how well your heart's chambers and valves are working. This procedure takes approximately one hour. There are no restrictions for this procedure.    Follow-Up: At [facility], you and your health needs are our priority.  As part of our continuing mission to provide you with exceptional heart care, we have created designated Provider Care Teams.  These Care Teams include your primary Cardiologist (physician) and Advanced Practice Providers (APPs -  Physician Assistants and Nurse Practitioners) who all work together to provide you with the care you need, when you need it.  We recommend signing up for the patient portal called "MyChart".  Sign up information is provided on this After Visit Summary.  MyChart is used to connect with patients for Virtual Visits (Telemedicine).  Patients are able to view lab/test results, encounter notes, upcoming appointments, etc.  Non-urgent messages can be sent to your provider as well.   To learn more about what you can do with MyChart, go to [email or web address].     Your next appointment:   9 month(s)  The format for your next appointment:   In Person  Provider:   Dorris Carnes, MD   Other Instructions

## 2020-12-10 ENCOUNTER — Other Ambulatory Visit: Payer: Self-pay

## 2020-12-10 ENCOUNTER — Encounter: Payer: Self-pay | Admitting: Adult Health

## 2020-12-10 ENCOUNTER — Ambulatory Visit: Payer: Medicare PPO | Admitting: Adult Health

## 2020-12-10 VITALS — BP 122/84 | HR 74 | Ht 62.5 in | Wt 217.2 lb

## 2020-12-10 DIAGNOSIS — G4733 Obstructive sleep apnea (adult) (pediatric): Secondary | ICD-10-CM | POA: Diagnosis not present

## 2020-12-10 DIAGNOSIS — Z9989 Dependence on other enabling machines and devices: Secondary | ICD-10-CM | POA: Diagnosis not present

## 2020-12-10 NOTE — Progress Notes (Addendum)
PATIENT: Hannah Nielsen DOB: 1951/02/08  REASON FOR VISIT: follow up HISTORY FROM: patient PRIMARY NEUROLOGIST: Dr. Rexene Alberts  HISTORY OF PRESENT ILLNESS: Today 12/10/20:  Hannah Nielsen is a 70 year old female with a history of obstructive sleep apnea on CPAP.  She returns today for follow-up.  She reports that the CPAP is working well for her.  She is still using her old machine.  She has not yet received a new machine.  She has reached out to her DME company and they have advised it may be 6 to 8 weeks.  She returns today for follow-up.  Her download is below    HISTORY 11/26/2019: I reviewed her CPAP compliance data from 10/26/2019 through 11/24/2019, which is a total of 30 days, during which time she used her machine every night with percent use days greater than 4 hours at 100%, indicating superb compliance with an average usage of 7 hours and 16 minutes, residual AHI at goal at 3.7/h, leak on the low side with a 95th percentile at 6.4 L/min on a pressure of 9 cm with EPR of 2.  She reports doing well with her CPAP.  She is compliant with treatment.  She has some mouth dryness and drinks water through the night, about 2 glasses.  She is typically up-to-date with her supplies.  She is using a full facemask as she is a mouth breather.  She has been taking melatonin at night, 10 mg strength, she finds it effective.  She has been using melatonin for the past 6 months or so.  REVIEW OF SYSTEMS: Out of a complete 14 system review of symptoms, the patient complains only of the following symptoms, and all other reviewed systems are negative.   ALLERGIES: Allergies  Allergen Reactions   Sulfa Antibiotics     Childhood allergy - unknown    HOME MEDICATIONS: Outpatient Medications Prior to Visit  Medication Sig Dispense Refill   amLODipine (NORVASC) 5 MG tablet TAKE 1 TABLET BY MOUTH EVERY DAY 90 tablet 3   ASPIRIN 81 PO Take 81 mg by mouth daily.      atorvastatin (LIPITOR) 40 MG tablet TAKE 1  TABLET BY MOUTH EVERY DAY AT 6 PM 90 tablet 3   CALCIUM PO Take 2 tablets by mouth daily.     Cholecalciferol (VITAMIN D) 50 MCG (2000 UT) tablet Take 2,000 Units by mouth daily. OTC     hydrochlorothiazide (MICROZIDE) 12.5 MG capsule TAKE 1 CAPSULE(12.5 MG) BY MOUTH DAILY. 90 capsule 2   KRILL OIL PO Take 1 capsule by mouth daily.     lisinopril (ZESTRIL) 10 MG tablet TAKE 1 TABLET BY MOUTH EVERY DAY 90 tablet 2   MELATONIN PO Take 10 mg by mouth daily.     Misc Natural Products (OSTEO BI-FLEX TRIPLE STRENGTH PO) Take by mouth.     Multiple Vitamin (MULTIVITAMIN) tablet Take 1 tablet by mouth daily.     nicotine polacrilex (COMMIT) 2 MG lozenge Take 2 mg by mouth as needed for smoking cessation.     OVER THE COUNTER MEDICATION Take by mouth in the morning and at bedtime. OSTEOBIFLEX W/ TUMERIC     polyethylene glycol (MIRALAX / GLYCOLAX) 17 g packet Take 17 g by mouth daily.     No facility-administered medications prior to visit.    PAST MEDICAL HISTORY: Past Medical History:  Diagnosis Date   Aneurysm of anterior cerebral artery: Right per MRI 07/01/14 07/02/2014   4.4 mm   CAD (coronary artery  disease), native coronary artery: Severe 2 vessel dz per cardiac cath 07/04/14 07/04/2014   Cataract    left immature   Constipation    takes Miralax daily as needed   GERD (gastroesophageal reflux disease)    rarely takes Tums   History of colon polyps    benign   History of kidney stones    Hyperlipidemia 07/02/2014   takes Lipitor daily   Hypertension    takes Amlodipine and HCTZ daily   Insomnia    Left atrial mass 07/03/2014   benign papillary fibroelastoma   Nocturia    takes Ditropan nightly   Papillary fibroelastoma of heart    S/P resection of left atrial benign papillary fibroelastoma and CABG x 2 07/10/2014   LIMA to LAD, SVG to RCA, EVH via right thigh   Shortness of breath dyspnea    with exertion    Sleep apnea    uses cpap   Stroke ([place]) 0000000   acute embolic  stroke to right MCA territory   Tingling    left hand,left breast,and left hip   Urinary frequency    Urinary urgency     PAST SURGICAL HISTORY: Past Surgical History:  Procedure Laterality Date   ABDOMINAL HYSTERECTOMY     COLONOSCOPY     CORONARY ARTERY BYPASS GRAFT N/A 07/10/2014   Procedure: CORONARY ARTERY BYPASS GRAFTING (CABG), ON PUMP, TIMES TWO, USING LEFT INTERNAL MAMMARY ARTERY, RIGHT GREATER SAPHENOUS VEIN HARVESTED ENDOSCOPICALLY;  Surgeon: Rexene Alberts, MD;  Location: [facility];  Service: Open Heart Surgery;  Laterality: N/A;   EXCISION OF ATRIAL MYXOMA N/A 07/10/2014   Procedure: RESECTION OF LEFT ATRIAL MASS;  Surgeon: Rexene Alberts, MD;  Location: [place] [facility];  Service: Open Heart Surgery;  Laterality: N/A;   EXTRACORPOREAL SHOCK WAVE LITHOTRIPSY Left 04/13/2017   Procedure: LEFT EXTRACORPOREAL SHOCK WAVE LITHOTRIPSY (ESWL);  Surgeon: Bjorn Loser, MD;  Location: [facility];  Service: Urology;  Laterality: Left;   LEFT HEART CATHETERIZATION WITH CORONARY ANGIOGRAM N/A 07/04/2014   Procedure: LEFT HEART CATHETERIZATION WITH CORONARY ANGIOGRAM;  Surgeon: Jettie Booze, MD;  Location: [facility] CATH LAB;  Service: Cardiovascular;  Laterality: N/A;   RADIOLOGY WITH ANESTHESIA N/A 12/23/2014   Procedure: Stent supported coil embolization OR right ACA aneurysm;  Surgeon: Consuella Lose, MD;  Location: [facility];  Service: Radiology;  Laterality: N/A;   REPAIR OF PATENT FORAMEN OVALE N/A 07/10/2014   Procedure: REPAIR OF PATENT FORAMEN OVALE;  Surgeon: Rexene Alberts, MD;  Location: [facility];  Service: Open Heart Surgery;  Laterality: N/A;   TEE WITHOUT CARDIOVERSION N/A 07/03/2014   Procedure: TRANSESOPHAGEAL ECHOCARDIOGRAM (TEE);  Surgeon: Jerline Pain, MD;  Location: [place];  Service: Cardiovascular;  Laterality: N/A;   TEE WITHOUT CARDIOVERSION N/A 07/10/2014   Procedure: TRANSESOPHAGEAL ECHOCARDIOGRAM (TEE);  Surgeon: Rexene Alberts, MD;  Location: [facility];  Service: Open Heart  Surgery;  Laterality: N/A;    FAMILY HISTORY: Family History  Problem Relation Age of Onset   Hypertension Mother    Heart failure Mother        Deceased   Heart attack Mother    Cerebral aneurysm Father 7       Deceased   Aneurysm Father    Breast cancer Maternal Grandmother    Heart attack Maternal Grandfather    Hypertension Sister    Hypertension Brother    Heart attack Daughter    Stroke Neg Hx     SOCIAL HISTORY: Social History  Socioeconomic History   Marital status: Divorced    Spouse name: Not on file   Number of children: 3   Years of education: college   Highest education level: Not on file  Occupational History    Comment: retired Pharmacist, hospital  Tobacco Use   Smoking status: Former    Types: Cigarettes    Quit date: 07/01/2014    Years since quitting: 6.4   Smokeless tobacco: Never   Tobacco comments:    Uses nicotiene losenges  Vaping Use   Vaping Use: Never used  Substance and Sexual Activity   Alcohol use: Yes    Alcohol/week: 1.0 standard drink    Types: 1 Cans of beer per week    Comment: rarely   Drug use: No   Sexual activity: Not on file  Other Topics Concern   Not on file  Social History Narrative   Patient lives at home alone and she is divorced.   Retired Pharmacist, hospital.   Education college.   Right handed.   Caffeine coffee two cups.   Social Determinants of Health   Financial Resource Strain: Not on file  Food Insecurity: Not on file  Transportation Needs: Not on file  Physical Activity: Not on file  Stress: Not on file  Social Connections: Not on file  Intimate Partner Violence: Not on file      PHYSICAL EXAM  Vitals:   12/10/20 0956  BP: 122/84  Pulse: 74  SpO2: 95%  Weight: 217 lb 4 oz (98.5 kg)  Height: 5' 2.5" (1.588 m)   Body mass index is 39.1 kg/m.  Generalized: Well developed, in no acute distress  Chest: Lungs clear to auscultation bilaterally  Neurological examination  Mentation: Alert oriented to time,  place, history taking. Follows all commands speech and language fluent Cranial nerve II-XII: Extraocular movements were full, visual field were full on confrontational test Head turning and shoulder shrug  were normal and symmetric. Motor: The motor testing reveals 5 over 5 strength of all 4 extremities. Good symmetric motor tone is noted throughout.  Sensory: Sensory testing is intact to soft touch on all 4 extremities. No evidence of extinction is noted.  Gait and station: Gait is normal.    DIAGNOSTIC DATA (LABS, IMAGING, TESTING) - I reviewed patient records, labs, notes, testing and imaging myself where available.  Lab Results  Component Value Date   WBC 10.2 01/04/2016   HGB 15.1 01/04/2016   HCT 44.1 01/04/2016   MCV 87.8 01/04/2016   PLT 333 01/04/2016      Component Value Date/Time   NA 140 01/04/2016 1004   K 4.3 01/04/2016 1004   CL 101 01/04/2016 1004   CO2 29 01/04/2016 1004   GLUCOSE 95 01/04/2016 1004   BUN 16 01/04/2016 1004   CREATININE 0.79 01/04/2016 1004   CALCIUM 9.9 01/04/2016 1004   PROT 7.0 09/22/2014 1035   ALBUMIN 3.9 09/22/2014 1035   AST 24 09/22/2014 1035   ALT 37 (H) 09/22/2014 1035   ALKPHOS 118 (H) 09/22/2014 1035   BILITOT 0.4 09/22/2014 1035   GFRNONAA >60 07/28/2015 0620   GFRAA >60 07/28/2015 0620   Lab Results  Component Value Date   CHOL 120 07/16/2020   HDL 39 (L) 07/16/2020   LDLCALC 56 07/16/2020   TRIG 142 07/16/2020   CHOLHDL 3.1 07/16/2020   Lab Results  Component Value Date   HGBA1C 5.9 (H) 07/16/2020       ASSESSMENT AND PLAN 70 y.o. year old female  has a past medical history of Aneurysm of anterior cerebral artery: Right per MRI 07/01/14 (07/02/2014), CAD (coronary artery disease), native coronary artery: Severe 2 vessel dz per cardiac cath 07/04/14 (07/04/2014), Cataract, Constipation, GERD (gastroesophageal reflux disease), History of colon polyps, History of kidney stones, Hyperlipidemia (07/02/2014), Hypertension,  Insomnia, Left atrial mass (07/03/2014), Nocturia, Papillary fibroelastoma of heart, S/P resection of left atrial benign papillary fibroelastoma and CABG x 2 (07/10/2014), Shortness of breath dyspnea, Sleep apnea, Stroke ([place]) (07/01/2014), Tingling, Urinary frequency, and Urinary urgency. here with:  OSA on CPAP  - CPAP compliance excellent - Good treatment of AHI  - Encourage patient to use CPAP nightly and > 4 hours each night - F/U in 1 year or sooner if needed    Ward Givens, MSN, NP-C 12/10/2020, 10:16 AM [facility] [address] [place], [place] [address] [phone]  I reviewed the above note and documentation by the Nurse Practitioner and agree with the history, exam, assessment and plan as outlined above. I was available for consultation. Star Age, MD, PhD [facility])

## 2020-12-15 DIAGNOSIS — R7303 Prediabetes: Secondary | ICD-10-CM | POA: Diagnosis not present

## 2020-12-15 DIAGNOSIS — Z1389 Encounter for screening for other disorder: Secondary | ICD-10-CM | POA: Diagnosis not present

## 2020-12-15 DIAGNOSIS — Z780 Asymptomatic menopausal state: Secondary | ICD-10-CM | POA: Diagnosis not present

## 2020-12-15 DIAGNOSIS — M8580 Other specified disorders of bone density and structure, unspecified site: Secondary | ICD-10-CM | POA: Diagnosis not present

## 2020-12-15 DIAGNOSIS — Z23 Encounter for immunization: Secondary | ICD-10-CM | POA: Diagnosis not present

## 2020-12-15 DIAGNOSIS — Z0000 Encounter for general adult medical examination without abnormal findings: Secondary | ICD-10-CM | POA: Diagnosis not present

## 2020-12-15 DIAGNOSIS — Z6838 Body mass index (BMI) 38.0-38.9, adult: Secondary | ICD-10-CM | POA: Diagnosis not present

## 2020-12-15 DIAGNOSIS — E7800 Pure hypercholesterolemia, unspecified: Secondary | ICD-10-CM | POA: Diagnosis not present

## 2020-12-15 DIAGNOSIS — Z79899 Other long term (current) drug therapy: Secondary | ICD-10-CM | POA: Diagnosis not present

## 2020-12-15 DIAGNOSIS — I700 Atherosclerosis of aorta: Secondary | ICD-10-CM | POA: Diagnosis not present

## 2020-12-15 DIAGNOSIS — I10 Essential (primary) hypertension: Secondary | ICD-10-CM | POA: Diagnosis not present

## 2020-12-31 ENCOUNTER — Other Ambulatory Visit (HOSPITAL_COMMUNITY): Payer: Medicare PPO

## 2021-01-06 ENCOUNTER — Other Ambulatory Visit: Payer: Self-pay | Admitting: Internal Medicine

## 2021-01-11 DIAGNOSIS — Z780 Asymptomatic menopausal state: Secondary | ICD-10-CM | POA: Diagnosis not present

## 2021-01-11 DIAGNOSIS — M8589 Other specified disorders of bone density and structure, multiple sites: Secondary | ICD-10-CM | POA: Diagnosis not present

## 2021-01-14 ENCOUNTER — Other Ambulatory Visit: Payer: Self-pay

## 2021-01-14 ENCOUNTER — Ambulatory Visit (HOSPITAL_COMMUNITY): Payer: Medicare PPO | Attending: Cardiology

## 2021-01-14 DIAGNOSIS — R0602 Shortness of breath: Secondary | ICD-10-CM | POA: Diagnosis not present

## 2021-01-14 LAB — ECHOCARDIOGRAM COMPLETE
Area-P 1/2: 2.37 cm2
S' Lateral: 2.4 cm

## 2021-01-18 ENCOUNTER — Telehealth: Payer: Self-pay

## 2021-01-18 DIAGNOSIS — R0602 Shortness of breath: Secondary | ICD-10-CM

## 2021-01-18 DIAGNOSIS — I2510 Atherosclerotic heart disease of native coronary artery without angina pectoris: Secondary | ICD-10-CM

## 2021-01-18 DIAGNOSIS — H109 Unspecified conjunctivitis: Secondary | ICD-10-CM | POA: Diagnosis not present

## 2021-01-18 NOTE — Telephone Encounter (Signed)
Pt verbalized understanding of her Echo results and agreed to a CPX she says she can walk on the treadmill but gets out of breath but I explained that is what we will be evaluating.

## 2021-01-18 NOTE — Telephone Encounter (Signed)
-----   Message from Fay Records, MD sent at 01/18/2021 12:09 AM EDT ----- Echo is normal Normal systolic and diastolic function of the heart Valves functioning OK If able to walk on a treadmill would recomm a cardiopulmonary stress test to look at response (pulmonary, heart) to walking

## 2021-01-25 DIAGNOSIS — K635 Polyp of colon: Secondary | ICD-10-CM | POA: Diagnosis not present

## 2021-01-25 DIAGNOSIS — K649 Unspecified hemorrhoids: Secondary | ICD-10-CM | POA: Diagnosis not present

## 2021-01-25 DIAGNOSIS — Z86010 Personal history of colonic polyps: Secondary | ICD-10-CM | POA: Diagnosis not present

## 2021-01-25 DIAGNOSIS — K5730 Diverticulosis of large intestine without perforation or abscess without bleeding: Secondary | ICD-10-CM | POA: Diagnosis not present

## 2021-01-27 DIAGNOSIS — K635 Polyp of colon: Secondary | ICD-10-CM | POA: Diagnosis not present

## 2021-03-29 ENCOUNTER — Telehealth: Payer: Self-pay | Admitting: Neurology

## 2021-03-29 NOTE — Telephone Encounter (Addendum)
Pt was scheduled for her Initial Cpap visit on 06/14/21 Pt was informed to bring machine and power cord to the appt. Pt is to be scheduled between the dates 04/29/21-06/27/21

## 2021-04-06 DIAGNOSIS — Z1231 Encounter for screening mammogram for malignant neoplasm of breast: Secondary | ICD-10-CM | POA: Diagnosis not present

## 2021-06-07 DIAGNOSIS — H52203 Unspecified astigmatism, bilateral: Secondary | ICD-10-CM | POA: Diagnosis not present

## 2021-06-07 DIAGNOSIS — H2513 Age-related nuclear cataract, bilateral: Secondary | ICD-10-CM | POA: Diagnosis not present

## 2021-06-07 DIAGNOSIS — H5213 Myopia, bilateral: Secondary | ICD-10-CM | POA: Diagnosis not present

## 2021-06-10 ENCOUNTER — Other Ambulatory Visit: Payer: Self-pay | Admitting: Internal Medicine

## 2021-06-14 ENCOUNTER — Encounter: Payer: Self-pay | Admitting: Neurology

## 2021-06-14 ENCOUNTER — Other Ambulatory Visit: Payer: Self-pay

## 2021-06-14 ENCOUNTER — Ambulatory Visit: Payer: Medicare PPO | Admitting: Neurology

## 2021-06-14 VITALS — BP 118/72 | HR 72 | Ht 62.0 in | Wt 216.2 lb

## 2021-06-14 DIAGNOSIS — G4733 Obstructive sleep apnea (adult) (pediatric): Secondary | ICD-10-CM | POA: Diagnosis not present

## 2021-06-14 DIAGNOSIS — Z9989 Dependence on other enabling machines and devices: Secondary | ICD-10-CM | POA: Diagnosis not present

## 2021-06-14 NOTE — Progress Notes (Signed)
Subjective:    Patient ID: Hannah Nielsen is a 71 y.o. female.  HPI    Interim history:   Ms. Tsukamoto is a 71 year old right-handed woman with an underlying medical history of hypertension, ACA aneurysm (s/p coiling under Dr. [name] on 12/23/14), coronary artery disease, status post 2 vessel CABG on 07/10/2014, papillary fibroblastoma of the heart, stroke in March 2016, hyperlipidemia, recent smoking cessation, and obesity, who presents for follow-up consultation of her obstructive sleep apnea, after starting a new CPAP machine.  The patient is unaccompanied today. I last saw her on 11/26/2019, at which time she was compliant and doing well with her CPAP.    I prescribed a new PAP machine in the interim.  Her set up date was 03/29/2021.  She has a ResMed air sense 11 auto set machine.   She saw Ward Givens, NP, in the interim on 12/10/2020, at which time she was compliant with her old machine, had not received her new machine yet.  Today, 06/14/2021: I reviewed her CPAP compliance data from 05/11/2021 through 06/09/2021, which is a total of 30 days, during which time she used her machine every night with percent use days greater than 4 hours at 100%, indicating superb compliance with an average usage of 7 hours and 48 minutes, residual AHI at goal at 1.2/h, leak acceptable with a 95th percentile at 7.4 L/min on a pressure of 9 cm with EPR of 3.  She reports doing well with her new machine, she is fully compliant and likes that it is a little smaller.  She uses a small fullface mask with good success.  She has been troubled by allergies for quite some time and has used over-the-counter Zyrtec and now Allegra, she is going to make an appointment with an allergy specialist.  She also needs to transfer to a new primary care as her primary care physician is retiring in the fall.   The patient's allergies, current medications, family history, past medical history, past social history, past surgical history and  problem list were reviewed and updated as appropriate.    Previously (copied from previous notes for reference):    I saw her on 11/06/2017, at which time she was fully compliant with her CPAP and doing well.   She saw Vaughan Browner, nurse practitioner in the interim on 10/25/2018, at which time she was fully compliant with her CPAP but was having trouble with dry mouth.  She was advised to try to increase the humidity setting on her machine and also consider using heated tubing.  She was advised to get in touch with her DME company about this.   I reviewed her CPAP compliance data from 10/26/2019 through 11/24/2019, which is a total of 30 days, during which time she used her machine every night with percent use days greater than 4 hours at 100%, indicating superb compliance with an average usage of 7 hours and 16 minutes, residual AHI at goal at 3.7/h, leak on the low side with a 95th percentile at 6.4 L/min on a pressure of 9 cm with EPR of 2.     I saw her on 01/31/2017, at which time she was fully compliant with CPAP and felt well. She was advised to follow-up in one year.    She saw Dr. Leonie Man in the interim on 04/24/2017. From the stroke standpoint she was deemed stable and advised to follow-up as needed.   I reviewed her CPAP compliance data from 10/03/2017 through 11/01/2017 which is a  total of 30 days, during which time she used her CPAP every night with percent used days greater than 4 hours at 100%, indicating superb compliance with an average usage of 6 hours and 18 minutes, residual AHI 4.8 per hour, leak low with the 95th percentile at 5.7 L/m on a pressure of 9 cm with EPR of 2.        I saw her on 10/07/2015, at which time she was doing well. We reviewed recent blood work from her primary care physician's office. She was compliant with CPAP therapy. She was advised to follow-up in one year. She had some intermittent numbness and tingling of the left hand, able to shake it out.   She  was seen in the interim by Ward Givens, nurse practitioner in January 2018 for her stroke follow-up and was advised to follow-up in one year, as she was stable.    I reviewed her CPAP compliance data from 09/27/2016 through 10/26/2016 which is a total of 30 days, during which time she used her CPAP every night with percent used days greater than 4 hours at 100%, indicating superb compliance with an average usage of 6 hours and 31 minutes, residual AHI 1.7 per hour, leak low with the 95th percentile at 5.4 L/m on a pressure of 9 cm with EPR of 2.    I saw her on 04/16/2015, at which time we talked about her sleep test results from her baseline sleep study on 01/18/2015 as well as her CPAP titration study from 02/08/2015. She was doing well with CPAP therapy and was also very pleased with her aneurysm coiling results. She felt improved with CPAP. She was fully compliant with treatment. She had no residual stroke related symptoms from March 2016. She quit smoking at the time of her stroke.    I reviewed her CPAP compliance data from 09/06/2015 through 10/05/2015, which is a total of 30 days, during which time she used her machine every night with percent used days greater than 4 hours at 93%, indicating excellent compliance with an average usage of 6 hours and 2 minutes, residual AHI 0.7 per hour, leak low with the 95th percentile at 11.6 L/m on a pressure of 9 cm with EPR of 2.   I first met her on 12/09/2014 at the request of Ward Givens and Dr. Leonie Man, at which time the patient reported snoring, witnessed apneic breathing pauses and excessive daytime somnolence. I invited her back for sleep study. She had a baseline sleep study, followed by a CPAP titration study. I went over her test results with her in detail today. Her baseline sleep study from 01/18/2015 showed a sleep efficiency of 57.4% with a latency to sleep of 24 minutes and wake after sleep onset of 139 minutes with mild to moderate sleep  fragmentation noted. She had an elevated arousal index. She had moderate PLMS with an index of 33 per hour, resulting in 11.7 arousals per hour. She had an increased percentage of light stage sleep, absence of slow-wave sleep and a decreased percentage of REM sleep with a prolonged REM latency. Average oxygen saturation was 93%, nadir was 87%. She had mild to moderate snoring. Total AHI was 15.5 per hour. Based on her sleep-related complaints and her sleep study results, I invited her back for a full night CPAP titration study. She had this on 02/08/2015. Sleep efficiency was 76.4% with a latency to sleep of 40 minutes and wake after sleep onset of 68.5 minutes with mild  to moderate sleep fragmentation noted. She had an arousal index of 10 per hour. She had an increased percentage of stage II sleep, absence of slow-wave sleep, and a decreased percentage of REM sleep at 14.2% with a prolonged REM latency of 130 minutes. She had no significant PLMS, EKG or EEG changes. Average oxygen saturation was 92%, nadir was 89%. CPAP was titrated from 5 cm to 11 cm. AHI was 3.7 per hour on the pressure of 9 cm. She had evidence of increase in central apneas on pressure is being on 9 cm. Based on her test results are prescribed CPAP therapy for home use.   I reviewed her CPAP compliance data from 03/16/2015 through 04/14/2015 which is a total of 30 days during which time she used her machine every night with percent used days greater than 4 hours at 100%, indicating superb compliance with an average usage of 7 hours and 45 minutes, residual AHI 2.4 per hour, leak low with the 95th percentile at 4.1 L/m on a pressure of 9 cm with EPR of 2.    12/09/2014: She reports snoring, witnessed apneas per daughter (recently shared a hotel room) and excessive daytime somnolence. As far as her stroke is concerned. She had woken up with a numbness and weakness of her left hand. Her weakness has improved and she has residual numbness in her  left hand, lateral aspect. She is using nicotine lozenges and has quit smoking on 07/01/2014 at the time of her stroke diagnosis. She drinks alcohol very occasionally. She drinks coffee 2 cups a day and no sodas and typically no other caffeine-containing beverages. She tries to drink water. She has no known family history of obstructive sleep apnea but does have a family history of early or sudden death. Her father snored heavily. She reports a bedtime of around 9 or 10 PM and a rise time around 4:30 or 5. She typically does not wake up rested and her Epworth sleepiness score is elevated at 15 out of 24 today, her fatigue score is 42 out of 63. She lives alone. 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 She is divorced 2. She has 3 grown children from her first marriage. She has a family history of brain aneurysm in her father who died when she was 89 years old from a ruptured brain aneurysm and his mother, her paternal grandmother had a brain aneurysm as well. Dr. Dorris Carnes is her cardiologist, Dr. Roxy Manns is her CT surgeon. She denies frank restless leg symptoms and is not known to twitch her legs and her sleep. She does wake up frequently in the middle of the night. She has to use the bathroom on average 2-3 times per night.  Her Past Medical History Is Significant For: Past Medical History:  Diagnosis Date   Aneurysm of anterior cerebral artery: Right per MRI 07/01/14 07/02/2014   4.4 mm   CAD (coronary artery disease), native coronary artery: Severe 2 vessel dz per cardiac cath 07/04/14 07/04/2014   Cataract    left immature   Constipation    takes Miralax daily as needed   GERD (gastroesophageal reflux disease)    rarely takes Tums   History of colon polyps    benign   History of kidney stones    Hyperlipidemia 07/02/2014   takes Lipitor daily   Hypertension    takes Amlodipine and HCTZ daily   Insomnia    Left atrial mass 07/03/2014   benign papillary fibroelastoma  Nocturia     takes Ditropan nightly   Papillary fibroelastoma of heart    S/P resection of left atrial benign papillary fibroelastoma and CABG x 2 07/10/2014   LIMA to LAD, SVG to RCA, EVH via right thigh   Shortness of breath dyspnea    with exertion    Sleep apnea    uses cpap   Stroke ([place]) 8/36/6294   acute embolic stroke to right MCA territory   Tingling    left hand,left breast,and left hip   Urinary frequency    Urinary urgency     Her Past Surgical History Is Significant For: Past Surgical History:  Procedure Laterality Date   ABDOMINAL HYSTERECTOMY     COLONOSCOPY     CORONARY ARTERY BYPASS GRAFT N/A 07/10/2014   Procedure: CORONARY ARTERY BYPASS GRAFTING (CABG), ON PUMP, TIMES TWO, USING LEFT INTERNAL MAMMARY ARTERY, RIGHT GREATER SAPHENOUS VEIN HARVESTED ENDOSCOPICALLY;  Surgeon: Rexene Alberts, MD;  Location: [facility];  Service: Open Heart Surgery;  Laterality: N/A;   EXCISION OF ATRIAL MYXOMA N/A 07/10/2014   Procedure: RESECTION OF LEFT ATRIAL MASS;  Surgeon: Rexene Alberts, MD;  Location: [facility] [place];  Service: Open Heart Surgery;  Laterality: N/A;   EXTRACORPOREAL SHOCK WAVE LITHOTRIPSY Left 04/13/2017   Procedure: LEFT EXTRACORPOREAL SHOCK WAVE LITHOTRIPSY (ESWL);  Surgeon: Bjorn Loser, MD;  Location: [facility];  Service: Urology;  Laterality: Left;   LEFT HEART CATHETERIZATION WITH CORONARY ANGIOGRAM N/A 07/04/2014   Procedure: LEFT HEART CATHETERIZATION WITH CORONARY ANGIOGRAM;  Surgeon: Jettie Booze, MD;  Location: [facility] CATH [facility];  Service: Cardiovascular;  Laterality: N/A;   RADIOLOGY WITH ANESTHESIA N/A 12/23/2014   Procedure: Stent supported coil embolization OR right ACA aneurysm;  Surgeon: Consuella Lose, MD;  Location: [facility];  Service: Radiology;  Laterality: N/A;   REPAIR OF PATENT FORAMEN OVALE N/A 07/10/2014   Procedure: REPAIR OF PATENT FORAMEN OVALE;  Surgeon: Rexene Alberts, MD;  Location: [place];  Service: Open Heart Surgery;  Laterality: N/A;   TEE WITHOUT  CARDIOVERSION N/A 07/03/2014   Procedure: TRANSESOPHAGEAL ECHOCARDIOGRAM (TEE);  Surgeon: Jerline Pain, MD;  Location: [place];  Service: Cardiovascular;  Laterality: N/A;   TEE WITHOUT CARDIOVERSION N/A 07/10/2014   Procedure: TRANSESOPHAGEAL ECHOCARDIOGRAM (TEE);  Surgeon: Rexene Alberts, MD;  Location: [facility];  Service: Open Heart Surgery;  Laterality: N/A;    Her Family History Is Significant For: Family History  Problem Relation Age of Onset   Hypertension Mother    Heart failure Mother        Deceased   Heart attack Mother    Cerebral aneurysm Father 63       Deceased   Aneurysm Father    Breast cancer Maternal Grandmother    Heart attack Maternal Grandfather    Hypertension Sister    Hypertension Brother    Heart attack Daughter    Stroke Neg Hx     Her Social History Is Significant For: Social History   Socioeconomic History   Marital status: Divorced    Spouse name: Not on file   Number of children: 3   Years of education: college   Highest education level: Not on file  Occupational History    Comment: retired Pharmacist, hospital  Tobacco Use   Smoking status: Former    Types: Cigarettes    Quit date: 07/01/2014    Years since quitting: 6.9   Smokeless tobacco: Never   Tobacco comments:    Uses nicotiene losenges  Vaping Use   Vaping Use: Never used  Substance and Sexual Activity   Alcohol use: Yes    Alcohol/week: 1.0 standard drink    Types: 1 Cans of beer per week    Comment: rarely   Drug use: No   Sexual activity: Not on file  Other Topics Concern   Not on file  Social History Narrative   Patient lives at home alone and she is divorced.   Retired Pharmacist, hospital.   Education college.   Right handed.   Caffeine coffee two cups.   Social Determinants of Health   Financial Resource Strain: Not on file  Food Insecurity: Not on file  Transportation Needs: Not on file  Physical Activity: Not on file  Stress: Not on file  Social Connections: Not on file     Her Allergies Are:  Allergies  Allergen Reactions   Sulfa Antibiotics     Childhood allergy - unknown  :   Her Current Medications Are:  Outpatient Encounter Medications as of 06/14/2021  Medication Sig   amLODipine (NORVASC) 5 MG tablet TAKE 1 TABLET BY MOUTH EVERY DAY   ASPIRIN 81 PO Take 81 mg by mouth daily.    atorvastatin (LIPITOR) 40 MG tablet TAKE 1 TABLET BY MOUTH EVERY DAY AT 6 PM   CALCIUM PO Take 2 tablets by mouth daily.   Cholecalciferol (VITAMIN D) 50 MCG (2000 UT) tablet Take 2,000 Units by mouth daily. OTC   hydrochlorothiazide (MICROZIDE) 12.5 MG capsule TAKE 1 CAPSULE BY MOUTH DAILY   KRILL OIL PO Take 1 capsule by mouth daily.   lisinopril (ZESTRIL) 10 MG tablet TAKE 1 TABLET BY MOUTH EVERY DAY   MELATONIN PO Take 10 mg by mouth daily.   Misc Natural Products (OSTEO BI-FLEX TRIPLE STRENGTH PO) Take by mouth.   Multiple Vitamin (MULTIVITAMIN) tablet Take 1 tablet by mouth daily.   nicotine polacrilex (COMMIT) 2 MG lozenge Take 2 mg by mouth as needed for smoking cessation.   OVER THE COUNTER MEDICATION Take by mouth in the morning and at bedtime. OSTEOBIFLEX W/ TUMERIC   polyethylene glycol (MIRALAX / GLYCOLAX) 17 g packet Take 17 g by mouth daily.   No facility-administered encounter medications on file as of 06/14/2021.  :  Review of Systems:  Out of a complete 14 point review of systems, all are reviewed and negative with the exception of these symptoms as listed below:  Review of Systems  Neurological:        Pt is here for CPAP follow up . Pt states she has no questions or concerns for this visit    Objective:  Neurological Exam  Physical Exam Physical Examination:   Vitals:   06/14/21 0744  BP: 118/72  Pulse: 72    General Examination: The patient is a very pleasant 71 y.o. female in no acute distress. She appears well-developed and well-nourished and well groomed.   HEENT: Normocephalic, atraumatic, pupils are equal, round and reactive to  light, tracking is well-preserved, she does have mild conjunctival erythema.  Mildly dry eyes noted, airway examination reveals mild to moderate mouth dryness, tongue protrudes centrally and palate elevates symmetrically, face is symmetric with normal facial animation, speech is clear without dysarthria, hypophonia or voice tremor, no carotid bruits.  Neck with full range of motion.     Chest: Clear to auscultation without wheezing, rhonchi or crackles noted.   Heart: S1+S2+0, regular and normal without murmurs, rubs or gallops noted.    Abdomen: Soft, non-tender  and non-distended with normal bowel sounds appreciated on auscultation.   Extremities: There is no obvious edema in the feet.   Skin: Warm and dry without trophic changes noted.   Musculoskeletal: exam reveals no obvious joint deformities.   Neurologically:  Mental status: The patient is awake, alert and oriented in all 4 spheres. Her immediate and remote memory, attention, language skills and fund of knowledge are appropriate. There is no evidence of aphasia, agnosia, apraxia or anomia. Speech is clear with normal prosody and enunciation. Thought process is linear. Mood is normal and affect is normal.  Cranial nerves II - XII are as described above under HEENT exam. Motor exam: Normal bulk, strength and tone is noted. Fine motor skills and coordination: grossly intact.  Cerebellar testing: No dysmetria or intention tremor. There is no truncal or gait ataxia.  Sensory exam: intact to light touch. Gait, station and balance: She stands easily. No veering to one side is noted. No leaning to one side is noted. Posture is age-appropriate and stance is narrow based. Gait shows normal stride length and normal pace. No problems turning are noted.    Assessment and plan:    In summary, Amairani Shuey is a very pleasant 71 year old female with an underlying medical history of hypertension, ACA aneurysm (s/p elective stent-supported coil  embolization under Dr. [name] on 12/23/14), coronary artery disease (s/p 2 vessel CABG on 07/10/2014), papillary fibroblastoma of the heart, stroke in March 2016, hyperlipidemia, prior smoking, history of kidney stone, and obesity, who presents for follow-up consultation of her obstructive sleep apnea, established on CPAP at 9 cm after starting treatment with a new machine since December 2022.  She continues to have full compliance and good results.  She has a history of moderate obstructive sleep apnea and sleep testing was in October 2016.  She is advised to continue with her treatment, she is commended for her treatment adherence.  He is advised to follow-up in this clinic to see one of our nurse practitioners in 1 year, which can also offer a virtual visit through [facility] if she prefers.  I answered all her questions today and she was in agreement.  I spent 25 minutes in total face-to-face time and in reviewing records during pre-charting, more than 50% of which was spent in counseling and coordination of care, reviewing test results, reviewing medications and treatment regimen and/or in discussing or reviewing the diagnosis of OSA, the prognosis and treatment options. Pertinent laboratory and imaging test results that were available during this visit with the patient were reviewed by me and considered in my medical decision making (see chart for details).

## 2021-06-14 NOTE — Patient Instructions (Addendum)
It was nice to see you again today.  I am glad to hear that things are going well with your new machine.  You are fully compliant with treatment and your numbers look good.   ?Please continue using your CPAP regularly. While your insurance requires that you use CPAP at least 4 hours each night on 70% of the nights, I recommend, that you not skip any nights and use it throughout the night if you can. Getting used to CPAP and staying with the treatment long term does take time and patience and discipline. Untreated obstructive sleep apnea when it is moderate to severe can have an adverse impact on cardiovascular health and raise her risk for heart disease, arrhythmias, hypertension, congestive heart failure, stroke and diabetes. Untreated obstructive sleep apnea causes sleep disruption, nonrestorative sleep, and sleep deprivation. This can have an impact on your day to day functioning and cause daytime sleepiness and impairment of cognitive function, memory loss, mood disturbance, and problems focussing. Using CPAP regularly can improve these symptoms. ?Keep up the good work!  ?We can see you in 1 year, you can see Jinny Blossom again next time.   ?

## 2021-06-15 DIAGNOSIS — E7800 Pure hypercholesterolemia, unspecified: Secondary | ICD-10-CM | POA: Diagnosis not present

## 2021-06-15 DIAGNOSIS — Z6839 Body mass index (BMI) 39.0-39.9, adult: Secondary | ICD-10-CM | POA: Diagnosis not present

## 2021-06-15 DIAGNOSIS — J309 Allergic rhinitis, unspecified: Secondary | ICD-10-CM | POA: Diagnosis not present

## 2021-06-15 DIAGNOSIS — I10 Essential (primary) hypertension: Secondary | ICD-10-CM | POA: Diagnosis not present

## 2021-06-15 DIAGNOSIS — I700 Atherosclerosis of aorta: Secondary | ICD-10-CM | POA: Diagnosis not present

## 2021-07-21 ENCOUNTER — Other Ambulatory Visit: Payer: Self-pay | Admitting: Internal Medicine

## 2021-10-06 ENCOUNTER — Other Ambulatory Visit: Payer: Self-pay | Admitting: Internal Medicine

## 2021-10-19 ENCOUNTER — Other Ambulatory Visit: Payer: Self-pay | Admitting: Internal Medicine

## 2021-11-03 DIAGNOSIS — M546 Pain in thoracic spine: Secondary | ICD-10-CM | POA: Diagnosis not present

## 2021-12-14 DIAGNOSIS — I10 Essential (primary) hypertension: Secondary | ICD-10-CM | POA: Diagnosis not present

## 2021-12-21 ENCOUNTER — Telehealth: Payer: Self-pay

## 2021-12-21 ENCOUNTER — Telehealth: Payer: Medicare PPO | Admitting: Adult Health

## 2021-12-21 NOTE — Patient Outreach (Signed)
  Care Coordination   12/21/2021 Name: Hannah Nielsen MRN: [id number] DOB: 09-05-1950   Care Coordination Outreach Attempts:  An unsuccessful telephone outreach was attempted today to offer the patient information about available care coordination services as a benefit of their health plan.   Follow Up Plan:  Additional outreach attempts will be made to offer the patient care coordination information and services.   Encounter Outcome:  No Answer  Care Coordination Interventions Activated:  No   Care Coordination Interventions:  No, not indicated    [facility] [phone]

## 2021-12-28 DIAGNOSIS — I10 Essential (primary) hypertension: Secondary | ICD-10-CM | POA: Diagnosis not present

## 2021-12-28 DIAGNOSIS — R911 Solitary pulmonary nodule: Secondary | ICD-10-CM | POA: Diagnosis not present

## 2021-12-28 DIAGNOSIS — Z79899 Other long term (current) drug therapy: Secondary | ICD-10-CM | POA: Diagnosis not present

## 2021-12-28 DIAGNOSIS — Z0000 Encounter for general adult medical examination without abnormal findings: Secondary | ICD-10-CM | POA: Diagnosis not present

## 2021-12-28 DIAGNOSIS — I700 Atherosclerosis of aorta: Secondary | ICD-10-CM | POA: Diagnosis not present

## 2021-12-28 DIAGNOSIS — Z1331 Encounter for screening for depression: Secondary | ICD-10-CM | POA: Diagnosis not present

## 2021-12-28 DIAGNOSIS — K760 Fatty (change of) liver, not elsewhere classified: Secondary | ICD-10-CM | POA: Diagnosis not present

## 2021-12-28 DIAGNOSIS — E7800 Pure hypercholesterolemia, unspecified: Secondary | ICD-10-CM | POA: Diagnosis not present

## 2021-12-28 DIAGNOSIS — Z6839 Body mass index (BMI) 39.0-39.9, adult: Secondary | ICD-10-CM | POA: Diagnosis not present

## 2021-12-28 DIAGNOSIS — Z23 Encounter for immunization: Secondary | ICD-10-CM | POA: Diagnosis not present

## 2021-12-28 DIAGNOSIS — D72829 Elevated white blood cell count, unspecified: Secondary | ICD-10-CM | POA: Diagnosis not present

## 2022-01-04 ENCOUNTER — Other Ambulatory Visit: Payer: Self-pay | Admitting: Geriatric Medicine

## 2022-01-04 ENCOUNTER — Ambulatory Visit
Admission: RE | Admit: 2022-01-04 | Discharge: 2022-01-04 | Disposition: A | Discharge status: Home / Self Care | Payer: Medicare PPO | Source: Ambulatory Visit | Attending: Geriatric Medicine | Admitting: Geriatric Medicine

## 2022-01-04 DIAGNOSIS — R911 Solitary pulmonary nodule: Secondary | ICD-10-CM | POA: Diagnosis not present

## 2022-01-10 DIAGNOSIS — E559 Vitamin D deficiency, unspecified: Secondary | ICD-10-CM | POA: Diagnosis not present

## 2022-01-10 DIAGNOSIS — G4730 Sleep apnea, unspecified: Secondary | ICD-10-CM | POA: Diagnosis not present

## 2022-01-10 DIAGNOSIS — R7301 Impaired fasting glucose: Secondary | ICD-10-CM | POA: Diagnosis not present

## 2022-01-10 DIAGNOSIS — I10 Essential (primary) hypertension: Secondary | ICD-10-CM | POA: Diagnosis not present

## 2022-01-10 DIAGNOSIS — R0602 Shortness of breath: Secondary | ICD-10-CM | POA: Diagnosis not present

## 2022-01-10 DIAGNOSIS — Z8639 Personal history of other endocrine, nutritional and metabolic disease: Secondary | ICD-10-CM | POA: Diagnosis not present

## 2022-01-10 DIAGNOSIS — Z1331 Encounter for screening for depression: Secondary | ICD-10-CM | POA: Diagnosis not present

## 2022-01-10 DIAGNOSIS — E65 Localized adiposity: Secondary | ICD-10-CM | POA: Diagnosis not present

## 2022-01-10 DIAGNOSIS — Z6839 Body mass index (BMI) 39.0-39.9, adult: Secondary | ICD-10-CM | POA: Diagnosis not present

## 2022-01-10 DIAGNOSIS — J449 Chronic obstructive pulmonary disease, unspecified: Secondary | ICD-10-CM | POA: Diagnosis not present

## 2022-01-10 DIAGNOSIS — R5383 Other fatigue: Secondary | ICD-10-CM | POA: Diagnosis not present

## 2022-01-10 DIAGNOSIS — E041 Nontoxic single thyroid nodule: Secondary | ICD-10-CM | POA: Diagnosis not present

## 2022-01-12 DIAGNOSIS — E042 Nontoxic multinodular goiter: Secondary | ICD-10-CM | POA: Diagnosis not present

## 2022-01-12 DIAGNOSIS — E041 Nontoxic single thyroid nodule: Secondary | ICD-10-CM | POA: Diagnosis not present

## 2022-01-13 ENCOUNTER — Other Ambulatory Visit: Payer: Self-pay | Admitting: Internal Medicine

## 2022-01-26 ENCOUNTER — Telehealth: Payer: Self-pay

## 2022-01-26 NOTE — Patient Outreach (Signed)
  Care Coordination   01/26/2022 Name: Hannah Nielsen MRN: [id number] DOB: 1950-09-16   Care Coordination Outreach Attempts:  A second unsuccessful outreach was attempted today to offer the patient with information about available care coordination services as a benefit of their health plan.     Follow Up Plan:  Additional outreach attempts will be made to offer the patient care coordination information and services.   Encounter Outcome:  No Answer  Care Coordination Interventions Activated:  No   Care Coordination Interventions:  No, not indicated    [facility] [phone]

## 2022-01-26 NOTE — Patient Outreach (Signed)
  Care Coordination   Initial Visit Note   01/26/2022 Name: Hannah Nielsen MRN: [id number] DOB: 1950-08-07  Hannah Nielsen is a 71 y.o. year old female who sees Thana Ates, MD for primary care. I spoke with  Reed Breech by phone today.  What matters to the patients health and wellness today?  No Concerns Expressed      SDOH assessments and interventions completed:  Yes  SDOH Interventions Today    Flowsheet Row Most Recent Value  SDOH Interventions   Food Insecurity Interventions Intervention Not Indicated  Transportation Interventions Intervention Not Indicated        Care Coordination Interventions Activated:  Yes  Care Coordination Interventions:  Yes, provided   Follow up plan: No further intervention required.   Encounter Outcome:  Patient Visit Completed    Katha Cabal [facility] [phone]

## 2022-01-27 DIAGNOSIS — Z6841 Body Mass Index (BMI) 40.0 and over, adult: Secondary | ICD-10-CM | POA: Diagnosis not present

## 2022-01-27 DIAGNOSIS — E041 Nontoxic single thyroid nodule: Secondary | ICD-10-CM | POA: Diagnosis not present

## 2022-01-27 DIAGNOSIS — K760 Fatty (change of) liver, not elsewhere classified: Secondary | ICD-10-CM | POA: Diagnosis not present

## 2022-01-27 DIAGNOSIS — K5900 Constipation, unspecified: Secondary | ICD-10-CM | POA: Diagnosis not present

## 2022-01-27 DIAGNOSIS — G4730 Sleep apnea, unspecified: Secondary | ICD-10-CM | POA: Diagnosis not present

## 2022-01-27 DIAGNOSIS — E65 Localized adiposity: Secondary | ICD-10-CM | POA: Diagnosis not present

## 2022-01-27 DIAGNOSIS — R7303 Prediabetes: Secondary | ICD-10-CM | POA: Diagnosis not present

## 2022-01-27 DIAGNOSIS — I10 Essential (primary) hypertension: Secondary | ICD-10-CM | POA: Diagnosis not present

## 2022-02-16 DIAGNOSIS — I10 Essential (primary) hypertension: Secondary | ICD-10-CM | POA: Diagnosis not present

## 2022-02-16 DIAGNOSIS — E65 Localized adiposity: Secondary | ICD-10-CM | POA: Diagnosis not present

## 2022-02-16 DIAGNOSIS — Z6841 Body Mass Index (BMI) 40.0 and over, adult: Secondary | ICD-10-CM | POA: Diagnosis not present

## 2022-02-16 DIAGNOSIS — E041 Nontoxic single thyroid nodule: Secondary | ICD-10-CM | POA: Diagnosis not present

## 2022-02-16 DIAGNOSIS — Z6839 Body mass index (BMI) 39.0-39.9, adult: Secondary | ICD-10-CM | POA: Diagnosis not present

## 2022-02-16 DIAGNOSIS — R7303 Prediabetes: Secondary | ICD-10-CM | POA: Diagnosis not present

## 2022-02-16 DIAGNOSIS — K5900 Constipation, unspecified: Secondary | ICD-10-CM | POA: Diagnosis not present

## 2022-03-09 DIAGNOSIS — K5900 Constipation, unspecified: Secondary | ICD-10-CM | POA: Diagnosis not present

## 2022-03-09 DIAGNOSIS — Z6838 Body mass index (BMI) 38.0-38.9, adult: Secondary | ICD-10-CM | POA: Diagnosis not present

## 2022-03-09 DIAGNOSIS — I10 Essential (primary) hypertension: Secondary | ICD-10-CM | POA: Diagnosis not present

## 2022-03-09 DIAGNOSIS — E041 Nontoxic single thyroid nodule: Secondary | ICD-10-CM | POA: Diagnosis not present

## 2022-03-09 DIAGNOSIS — R7303 Prediabetes: Secondary | ICD-10-CM | POA: Diagnosis not present

## 2022-03-09 DIAGNOSIS — E65 Localized adiposity: Secondary | ICD-10-CM | POA: Diagnosis not present

## 2022-03-28 DIAGNOSIS — Z6838 Body mass index (BMI) 38.0-38.9, adult: Secondary | ICD-10-CM | POA: Diagnosis not present

## 2022-03-28 DIAGNOSIS — R7303 Prediabetes: Secondary | ICD-10-CM | POA: Diagnosis not present

## 2022-03-28 DIAGNOSIS — E65 Localized adiposity: Secondary | ICD-10-CM | POA: Diagnosis not present

## 2022-03-31 ENCOUNTER — Other Ambulatory Visit: Payer: Self-pay | Admitting: Internal Medicine

## 2022-04-15 ENCOUNTER — Other Ambulatory Visit: Payer: Self-pay | Admitting: Internal Medicine

## 2022-04-18 ENCOUNTER — Other Ambulatory Visit: Payer: Self-pay | Admitting: Internal Medicine

## 2022-05-01 NOTE — Progress Notes (Signed)
Cardiology Office Note   Date:  05/02/2022   ID:  Hannah Nielsen, DOB 12/03/1950, MRN [id number]  PCP:  Charlane Ferretti, MD  Cardiologist:   Dorris Carnes, MD   F/U of CAD and HTN    History of Present Illness: Hannah Nielsen is a 72 y.o. 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   Pt with CAD  Undwent removal of fibrolastoma and CABG (LIMA to LAD; SVG to RCA) as well as PFO closure  All done in 2016     I saw the pt in clinic in Aug 2022 The pt denies CP   But she says  she gets out of breath very easy   Frustrating     Going to [facility] to lose wt   On Metformin   Current Meds  Medication Sig   amLODipine (NORVASC) 5 MG tablet TAKE 1 TABLET BY MOUTH EVERY DAY   ASPIRIN 81 PO Take 81 mg by mouth daily.    atorvastatin (LIPITOR) 40 MG tablet Take 1 tablet (40 mg total) by mouth daily. Pt needs to keep upcoming appt in Jan, 2024 for further refills   CALCIUM PO Take 2 tablets by mouth daily.   Cholecalciferol (VITAMIN D) 50 MCG (2000 UT) tablet Take 2,000 Units by mouth daily. OTC   hydrochlorothiazide (MICROZIDE) 12.5 MG capsule TAKE 1 CAPSULE BY MOUTH EVERY DAY. - Pt needs to keep upcoming appt in Jan, 2024 for further refills   KRILL OIL PO Take 1 capsule by mouth daily.   lisinopril (ZESTRIL) 10 MG tablet Take 1 tablet (10 mg total) by mouth daily. Pt needs to keep upcoming appt in Jan, 2024 for further refills   loratadine-pseudoephedrine (CLARITIN-D 12 HOUR) 5-120 MG tablet Take 1 tablet by mouth as needed for allergies.   MELATONIN PO Take 10 mg by mouth daily.   metFORMIN (GLUCOPHAGE) 500 MG tablet Take 500 mg by mouth 2 (two) times daily with a meal.   Misc Natural Products (OSTEO BI-FLEX TRIPLE STRENGTH PO) Take by mouth.   Multiple Vitamin (MULTIVITAMIN) tablet Take 1 tablet by mouth daily.   nicotine polacrilex (COMMIT) 2 MG lozenge Take 2 mg by mouth as needed for smoking cessation.   OVER THE COUNTER MEDICATION Take by mouth in the  morning and at bedtime. OSTEOBIFLEX W/ TUMERIC   polyethylene glycol (MIRALAX / GLYCOLAX) 17 g packet Take 17 g by mouth daily.     Allergies:   Sulfa antibiotics   Past Medical History:  Diagnosis Date   Aneurysm of anterior cerebral artery: Right per MRI 07/01/14 07/02/2014   4.4 mm   CAD (coronary artery disease), native coronary artery: Severe 2 vessel dz per cardiac cath 07/04/14 07/04/2014   Cataract    left immature   Constipation    takes Miralax daily as needed   GERD (gastroesophageal reflux disease)    rarely takes Tums   History of colon polyps    benign   History of kidney stones    Hyperlipidemia 07/02/2014   takes Lipitor daily   Hypertension    takes Amlodipine and HCTZ daily   Insomnia    Left atrial mass 07/03/2014   benign papillary fibroelastoma   Nocturia    takes Ditropan nightly   Papillary fibroelastoma of heart    S/P resection of left atrial benign papillary fibroelastoma and CABG x 2 07/10/2014   LIMA to LAD, SVG to RCA, EVH via right thigh  Shortness of breath dyspnea    with exertion    Sleep apnea    uses cpap   Stroke ([place]) 08/26/15   acute embolic stroke to right MCA territory   Tingling    left hand,left breast,and left hip   Urinary frequency    Urinary urgency     Past Surgical History:  Procedure Laterality Date   ABDOMINAL HYSTERECTOMY     COLONOSCOPY     CORONARY ARTERY BYPASS GRAFT N/A 07/10/2014   Procedure: CORONARY ARTERY BYPASS GRAFTING (CABG), ON PUMP, TIMES TWO, USING LEFT INTERNAL MAMMARY ARTERY, RIGHT GREATER SAPHENOUS VEIN HARVESTED ENDOSCOPICALLY;  Surgeon: Rexene Alberts, MD;  Location: [place];  Service: Open Heart Surgery;  Laterality: N/A;   EXCISION OF ATRIAL MYXOMA N/A 07/10/2014   Procedure: RESECTION OF LEFT ATRIAL MASS;  Surgeon: Rexene Alberts, MD;  Location: [place];  Service: Open Heart Surgery;  Laterality: N/A;   EXTRACORPOREAL SHOCK WAVE LITHOTRIPSY Left 04/13/2017   Procedure: LEFT EXTRACORPOREAL SHOCK WAVE  LITHOTRIPSY (ESWL);  Surgeon: Bjorn Loser, MD;  Location: [facility];  Service: Urology;  Laterality: Left;   LEFT HEART CATHETERIZATION WITH CORONARY ANGIOGRAM N/A 07/04/2014   Procedure: LEFT HEART CATHETERIZATION WITH CORONARY ANGIOGRAM;  Surgeon: Jettie Booze, MD;  [facility];  Service: Cardiovascular;  Laterality: N/A;   RADIOLOGY WITH ANESTHESIA N/A 12/23/2014   Procedure: Stent supported coil embolization OR right ACA aneurysm;  Surgeon: Consuella Lose, MD;  Location: [facility];  Service: Radiology;  Laterality: N/A;   REPAIR OF PATENT FORAMEN OVALE N/A 07/10/2014   Procedure: REPAIR OF PATENT FORAMEN OVALE;  Surgeon: Rexene Alberts, MD;  Location: [place];  Service: Open Heart Surgery;  Laterality: N/A;   TEE WITHOUT CARDIOVERSION N/A 07/03/2014   Procedure: TRANSESOPHAGEAL ECHOCARDIOGRAM (TEE);  Surgeon: Jerline Pain, MD;  Location: [place];  Service: Cardiovascular;  Laterality: N/A;   TEE WITHOUT CARDIOVERSION N/A 07/10/2014   Procedure: TRANSESOPHAGEAL ECHOCARDIOGRAM (TEE);  Surgeon: Rexene Alberts, MD;  Location: [place];  Service: Open Heart Surgery;  Laterality: N/A;     Social History:  The patient  reports that she quit smoking about 7 years ago. Her smoking use included cigarettes. She has never used smokeless tobacco. She reports current alcohol use of about 1.0 standard drink of alcohol per week. She reports that she does not use drugs.   Family History:  The patient's family history includes Aneurysm in her father; Breast cancer in her maternal grandmother; Cerebral aneurysm (age of onset: 9) in her father; Heart attack in her daughter, maternal grandfather, and mother; Heart failure in her mother; Hypertension in her brother, mother, and sister.    ROS:  Please see the history of present illness. All other systems are reviewed and  Negative to the above problem except as noted.    PHYSICAL EXAM: VS:  BP 124/68   Pulse 85   Ht '5\' 2"'$  (1.575 m)    Wt 206 lb 12.8 oz (93.8 kg)   SpO2 95%   BMI 37.82 kg/m   GEN: Morbidly obese 72 yo  in no acute distress  HEENT: normal  Neck: no JVD,Cardiac: RRR; no murmur  ,no edema  Respiratory:  clear to auscultation bilaterally  GI: soft, nontender, nondistended, + BS  No hepatomegaly  MS: no deformity Moving all extremities   Skin: warm and dry, no rash Neuro:  Strength and sensation are intact Psych: euthymic mood, full affect   EKG:  EKG is ordered  today.    SR 85 bpm   RBBB  LAFB    Echo    1. Left ventricular ejection fraction, by estimation, is 60 to 65%. Left  ventricular ejection fraction by 3D volume is 63 %. The left ventricle has  normal function. The left ventricle has no regional wall motion  abnormalities. Left ventricular diastolic   parameters were normal.   2. Right ventricular systolic function is normal. The right ventricular  size is normal. There is normal pulmonary artery systolic pressure. The  estimated right ventricular systolic pressure is 69.6 mmHg.   3. The mitral valve is normal in structure. Trivial mitral valve  regurgitation. No evidence of mitral stenosis.   4. The aortic valve is tricuspid. Aortic valve regurgitation is not  visualized. Mild aortic valve sclerosis is present, with no evidence of  aortic valve stenosis.   5. The inferior vena cava is normal in size with greater than 50%  respiratory variability, suggesting right atrial pressure of 3 mmHg.   6. S/P LA myoxma resection.   7. S/P PFO repair with no evidence of shunt by coloflow doppler.   Myoview May 2021 There was no ST segment deviation noted during stress. The left ventricular ejection fraction is hyperdynamic (>65%). Nuclear stress EF: 70%. The study is normal. This is a low risk study.  Lipid Panel    Component Value Date/Time   CHOL 120 07/16/2020 0926   TRIG 142 07/16/2020 0926   HDL 39 (L) 07/16/2020 0926   CHOLHDL 3.1 07/16/2020 0926   CHOLHDL 2.5 01/04/2016 1004    VLDL 26 01/04/2016 1004   LDLCALC 56 07/16/2020 0926      Wt Readings from Last 3 Encounters:  05/02/22 206 lb 12.8 oz (93.8 kg)  06/14/21 216 lb 3.2 oz (98.1 kg)  12/10/20 217 lb 4 oz (98.5 kg)      ASSESSMENT AND PLAN:  1  CAD   CABG in 2016 (LIMA to LAD; SVG to RCA)  She complains of SOB with exertion.  May be more prominent    PFTs prior to surgery were not too remarkabe  She had a myoview in the past    Will order a GXT myoview to evaluate BP, exercise tolerance and perfusion PFTs prior to surgery were not too remarkabe    2.  HTN  BP is controlled on current regimen  Continue      3 HL  Lipids are excellent  LDL 57 HDL 39    4  Obesity  reviewed diet   Overall pretty good   F/U in 6 months   Current medicines are reviewed at length with the patient today.  The patient does not have concerns regarding medicines.  Signed, Dorris Carnes, MD  05/02/2022 2:06 PM    [facility] [address], [place], [place]  [address] Phone: [phone]; Fax: [phone]

## 2022-05-02 ENCOUNTER — Ambulatory Visit: Payer: Medicare PPO | Attending: Internal Medicine | Admitting: Internal Medicine

## 2022-05-02 ENCOUNTER — Encounter: Payer: Self-pay | Admitting: Internal Medicine

## 2022-05-02 VITALS — BP 124/68 | HR 85 | Ht 62.0 in | Wt 206.8 lb

## 2022-05-02 DIAGNOSIS — R0602 Shortness of breath: Secondary | ICD-10-CM

## 2022-05-02 DIAGNOSIS — I10 Essential (primary) hypertension: Secondary | ICD-10-CM | POA: Diagnosis not present

## 2022-05-02 DIAGNOSIS — I2510 Atherosclerotic heart disease of native coronary artery without angina pectoris: Secondary | ICD-10-CM

## 2022-05-02 MED ORDER — AMLODIPINE BESYLATE 5 MG PO TABS: 5.0000 mg | ORAL_TABLET | Freq: Every day | ORAL | 3 refills | Status: DC

## 2022-05-02 MED ORDER — HYDROCHLOROTHIAZIDE 12.5 MG PO CAPS: ORAL_CAPSULE | ORAL | 3 refills | Status: DC

## 2022-05-02 MED ORDER — ATORVASTATIN CALCIUM 40 MG PO TABS: 40.0000 mg | ORAL_TABLET | Freq: Every day | ORAL | 3 refills | Status: DC

## 2022-05-02 MED ORDER — LISINOPRIL 10 MG PO TABS: 10.0000 mg | ORAL_TABLET | Freq: Every day | ORAL | 3 refills | Status: DC

## 2022-05-02 NOTE — Patient Instructions (Signed)
Medication Instructions:   *If you need a refill on your cardiac medications before your next appointment, please call your pharmacy*   Lab Work:  If you have labs (blood work) drawn today and your tests are completely normal, you will receive your results only by: [place] (if you have MyChart) OR A paper copy in the mail If you have any lab test that is abnormal or we need to change your treatment, we will call you to review the results.   Testing/Procedures: Your physician has requested that you have en exercise stress myoview. For further information please visit [email or web address]. Please follow instruction sheet, as given.    Follow-Up: At [facility], you and your health needs are our priority.  As part of our continuing mission to provide you with exceptional heart care, we have created designated Provider Care Teams.  These Care Teams include your primary Cardiologist (physician) and Advanced Practice Providers (APPs -  Physician Assistants and Nurse Practitioners) who all work together to provide you with the care you need, when you need it.  We recommend signing up for the patient portal called "MyChart".  Sign up information is provided on this After Visit Summary.  MyChart is used to connect with patients for Virtual Visits (Telemedicine).  Patients are able to view lab/test results, encounter notes, upcoming appointments, etc.  Non-urgent messages can be sent to your provider as well.   To learn more about what you can do with MyChart, go to [email or web address].    Your next appointment:   1 year(s)  Provider:   Dorris Carnes, MD     Other Instructions

## 2022-05-03 ENCOUNTER — Telehealth: Payer: Self-pay

## 2022-05-03 NOTE — Telephone Encounter (Signed)
Spoke to the patient, detailed instructions given. She stated that she would be there for her test. Asked to call back with any questions. S.Gergory Biello EMTP

## 2022-05-09 ENCOUNTER — Ambulatory Visit (HOSPITAL_COMMUNITY): Payer: Medicare PPO | Attending: Internal Medicine

## 2022-05-09 DIAGNOSIS — I10 Essential (primary) hypertension: Secondary | ICD-10-CM

## 2022-05-09 DIAGNOSIS — R0602 Shortness of breath: Secondary | ICD-10-CM | POA: Diagnosis not present

## 2022-05-09 DIAGNOSIS — I2510 Atherosclerotic heart disease of native coronary artery without angina pectoris: Secondary | ICD-10-CM

## 2022-05-09 LAB — MYOCARDIAL PERFUSION IMAGING
LV dias vol: 58 mL (ref 46–106)
LV sys vol: 20 mL
Nuc Stress EF: 66 %
Peak HR: 90 {beats}/min
Rest HR: 72 {beats}/min
Rest Nuclear Isotope Dose: 10.2 mCi
SDS: 2
SRS: 0
SSS: 2
ST Depression (mm): 0 mm
Stress Nuclear Isotope Dose: 30.1 mCi
TID: 1.14

## 2022-05-09 MED ORDER — REGADENOSON 0.4 MG/5ML IV SOLN
0.4000 mg | Freq: Once | INTRAVENOUS | Status: AC
  Administered 2022-05-09: 0.4 mg via INTRAVENOUS

## 2022-05-09 MED ORDER — TECHNETIUM TC 99M TETROFOSMIN IV KIT
10.2000 | PACK | Freq: Once | INTRAVENOUS | Status: AC | PRN
  Administered 2022-05-09: 10.2 via INTRAVENOUS

## 2022-05-09 MED ORDER — TECHNETIUM TC 99M TETROFOSMIN IV KIT
30.1000 | PACK | Freq: Once | INTRAVENOUS | Status: AC | PRN
  Administered 2022-05-09: 30.1 via INTRAVENOUS

## 2022-06-07 ENCOUNTER — Other Ambulatory Visit: Payer: Self-pay | Admitting: Internal Medicine

## 2022-06-13 DIAGNOSIS — H52203 Unspecified astigmatism, bilateral: Secondary | ICD-10-CM | POA: Diagnosis not present

## 2022-06-13 DIAGNOSIS — H5213 Myopia, bilateral: Secondary | ICD-10-CM | POA: Diagnosis not present

## 2022-06-13 DIAGNOSIS — H2513 Age-related nuclear cataract, bilateral: Secondary | ICD-10-CM | POA: Diagnosis not present

## 2022-06-16 ENCOUNTER — Telehealth: Payer: Self-pay | Admitting: *Deleted

## 2022-06-16 ENCOUNTER — Telehealth: Payer: Self-pay | Admitting: Neurology

## 2022-06-16 ENCOUNTER — Telehealth: Payer: Medicare PPO | Admitting: Neurology

## 2022-06-16 DIAGNOSIS — G4733 Obstructive sleep apnea (adult) (pediatric): Secondary | ICD-10-CM

## 2022-06-16 NOTE — Progress Notes (Signed)
Interim history:   Hannah Nielsen is a 72 year old right-handed woman with an underlying medical history of hypertension, ACA aneurysm (s/p coiling under Dr. [name] on 12/23/14), coronary artery disease, status post 2 vessel CABG on 07/10/2014, papillary fibroblastoma of the heart, stroke in March 2016, hyperlipidemia, recent smoking cessation, and obesity, who presents for follow-up a MyChart video visit for a virtual follow up consultation of her obstructive sleep apnea, on treatment with a CPAP machine.  The patient is unaccompanied today and joins via her cell phone. She is located in her home, I am located in my office at [facility], using my work [employer]. I last saw her 06/14/2021, at which time she reported doing well with her new machine, she was fully compliant.   Today, 06/16/22: I reviewed her CPAP compliance data from 05/16/2022 through 06/14/2022, which is a total of 30 days, during which time she used her machine every night with percent use days greater than 4 hours at 100%, indicating superb compliance with an average usage of 7 hours and 8 minutes, residual AHI at goal at 0.9/h, leak on the low side with the 95th percentile at 3.7 L/min on a pressure of 9 cm with EPR of 3.  She reports overall doing well, some mouth dryness, occasional difficulty maintaining sleep, has to sip water at night, uses a small fullface mask.  She does have nocturia on average 3 times per night.  She has been going to the [place] [facility] for weight loss and thus far has lost over 20 pounds.  Her current weight is 197.  She is going to start seeing a new primary care, Dr. Louis Matte.  She tries to hydrate well during the day.  She had a stress test not too long ago which was benign per her report. She had a Myoview scan on 05/09/2022, I could not see the final result or conclusion in her report.   The patient's allergies, current medications, family history, past medical history, past social history, past surgical history  and problem list were reviewed and updated as appropriate.    Previously (copied from previous notes for reference):      I saw her on 11/26/2019, at which time she was compliant and doing well with her CPAP.     I prescribed a new PAP machine in the interim.  Her set up date was 03/29/2021.  She has a ResMed air sense 11 auto set machine.    She saw Ward Givens, NP, in the interim on 12/10/2020, at which time she was compliant with her old machine, had not received her new machine yet.   I reviewed her CPAP compliance data from 05/11/2021 through 06/09/2021, which is a total of 30 days, during which time she used her machine every night with percent use days greater than 4 hours at 100%, indicating superb compliance with an average usage of 7 hours and 48 minutes, residual AHI at goal at 1.2/h, leak acceptable with a 95th percentile at 7.4 L/min on a pressure of 9 cm with EPR of 3.     I saw her on 11/06/2017, at which time she was fully compliant with her CPAP and doing well.   She saw Vaughan Browner, nurse practitioner in the interim on 10/25/2018, at which time she was fully compliant with her CPAP but was having trouble with dry mouth.  She was advised to try to increase the humidity setting on her machine and also consider using heated tubing.  She  was advised to get in touch with her DME company about this.   I reviewed her CPAP compliance data from 10/26/2019 through 11/24/2019, which is a total of 30 days, during which time she used her machine every night with percent use days greater than 4 hours at 100%, indicating superb compliance with an average usage of 7 hours and 16 minutes, residual AHI at goal at 3.7/h, leak on the low side with a 95th percentile at 6.4 L/min on a pressure of 9 cm with EPR of 2.     I saw her on 01/31/2017, at which time she was fully compliant with CPAP and felt well. She was advised to follow-up in one year.    She saw Dr. Leonie Man in the interim on 04/24/2017. From  the stroke standpoint she was deemed stable and advised to follow-up as needed.   I reviewed her CPAP compliance data from 10/03/2017 through 11/01/2017 which is a total of 30 days, during which time she used her CPAP every night with percent used days greater than 4 hours at 100%, indicating superb compliance with an average usage of 6 hours and 18 minutes, residual AHI 4.8 per hour, leak low with the 95th percentile at 5.7 L/m on a pressure of 9 cm with EPR of 2.        I saw her on 10/07/2015, at which time she was doing well. We reviewed recent blood work from her primary care physician's office. She was compliant with CPAP therapy. She was advised to follow-up in one year. She had some intermittent numbness and tingling of the left hand, able to shake it out.   She was seen in the interim by Ward Givens, nurse practitioner in January 2018 for her stroke follow-up and was advised to follow-up in one year, as she was stable.    I reviewed her CPAP compliance data from 09/27/2016 through 10/26/2016 which is a total of 30 days, during which time she used her CPAP every night with percent used days greater than 4 hours at 100%, indicating superb compliance with an average usage of 6 hours and 31 minutes, residual AHI 1.7 per hour, leak low with the 95th percentile at 5.4 L/m on a pressure of 9 cm with EPR of 2.    I saw her on 04/16/2015, at which time we talked about her sleep test results from her baseline sleep study on 01/18/2015 as well as her CPAP titration study from 02/08/2015. She was doing well with CPAP therapy and was also very pleased with her aneurysm coiling results. She felt improved with CPAP. She was fully compliant with treatment. She had no residual stroke related symptoms from March 2016. She quit smoking at the time of her stroke.    I reviewed her CPAP compliance data from 09/06/2015 through 10/05/2015, which is a total of 30 days, during which time she used her machine every  night with percent used days greater than 4 hours at 93%, indicating excellent compliance with an average usage of 6 hours and 2 minutes, residual AHI 0.7 per hour, leak low with the 95th percentile at 11.6 L/m on a pressure of 9 cm with EPR of 2.   I first met her on 12/09/2014 at the request of Ward Givens and Dr. Leonie Man, at which time the patient reported snoring, witnessed apneic breathing pauses and excessive daytime somnolence. I invited her back for sleep study. She had a baseline sleep study, followed by a CPAP titration study. I  went over her test results with her in detail today. Her baseline sleep study from 01/18/2015 showed a sleep efficiency of 57.4% with a latency to sleep of 24 minutes and wake after sleep onset of 139 minutes with mild to moderate sleep fragmentation noted. She had an elevated arousal index. She had moderate PLMS with an index of 33 per hour, resulting in 11.7 arousals per hour. She had an increased percentage of light stage sleep, absence of slow-wave sleep and a decreased percentage of REM sleep with a prolonged REM latency. Average oxygen saturation was 93%, nadir was 87%. She had mild to moderate snoring. Total AHI was 15.5 per hour. Based on her sleep-related complaints and her sleep study results, I invited her back for a full night CPAP titration study. She had this on 02/08/2015. Sleep efficiency was 76.4% with a latency to sleep of 40 minutes and wake after sleep onset of 68.5 minutes with mild to moderate sleep fragmentation noted. She had an arousal index of 10 per hour. She had an increased percentage of stage II sleep, absence of slow-wave sleep, and a decreased percentage of REM sleep at 14.2% with a prolonged REM latency of 130 minutes. She had no significant PLMS, EKG or EEG changes. Average oxygen saturation was 92%, nadir was 89%. CPAP was titrated from 5 cm to 11 cm. AHI was 3.7 per hour on the pressure of 9 cm. She had evidence of increase in central apneas  on pressure is being on 9 cm. Based on her test results are prescribed CPAP therapy for home use.   I reviewed her CPAP compliance data from 03/16/2015 through 04/14/2015 which is a total of 30 days during which time she used her machine every night with percent used days greater than 4 hours at 100%, indicating superb compliance with an average usage of 7 hours and 45 minutes, residual AHI 2.4 per hour, leak low with the 95th percentile at 4.1 L/m on a pressure of 9 cm with EPR of 2.    12/09/2014: She reports snoring, witnessed apneas per daughter (recently shared a hotel room) and excessive daytime somnolence. As far as her stroke is concerned. She had woken up with a numbness and weakness of her left hand. Her weakness has improved and she has residual numbness in her left hand, lateral aspect. She is using nicotine lozenges and has quit smoking on 07/01/2014 at the time of her stroke diagnosis. She drinks alcohol very occasionally. She drinks coffee 2 cups a day and no sodas and typically no other caffeine-containing beverages. She tries to drink water. She has no known family history of obstructive sleep apnea but does have a family history of early or sudden death. Her father snored heavily. She reports a bedtime of around 9 or 10 PM and a rise time around 4:30 or 5. She typically does not wake up rested and her Epworth sleepiness score is elevated at 15 out of 24 today, her fatigue score is 42 out of 63. She lives alone. 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 She is divorced 2. She has 3 grown children from her first marriage. She has a family history of brain aneurysm in her father who died when she was 70 years old from a ruptured brain aneurysm and his mother, her paternal grandmother had a brain aneurysm as well. Dr. Dorris Carnes is her cardiologist, Dr. Roxy Manns is her CT surgeon. She denies frank restless leg symptoms and is not known to twitch  her legs and her sleep. She does wake up  frequently in the middle of the night. She has to use the bathroom on average 2-3 times per night.   Her Past Medical History Is Significant For: Past Medical History:  Diagnosis Date   Aneurysm of anterior cerebral artery: Right per MRI 07/01/14 07/02/2014   4.4 mm   CAD (coronary artery disease), native coronary artery: Severe 2 vessel dz per cardiac cath 07/04/14 07/04/2014   Cataract    left immature   Constipation    takes Miralax daily as needed   GERD (gastroesophageal reflux disease)    rarely takes Tums   History of colon polyps    benign   History of kidney stones    Hyperlipidemia 07/02/2014   takes Lipitor daily   Hypertension    takes Amlodipine and HCTZ daily   Insomnia    Left atrial mass 07/03/2014   benign papillary fibroelastoma   Nocturia    takes Ditropan nightly   Papillary fibroelastoma of heart    S/P resection of left atrial benign papillary fibroelastoma and CABG x 2 07/10/2014   LIMA to LAD, SVG to RCA, EVH via right thigh   Shortness of breath dyspnea    with exertion    Sleep apnea    uses cpap   Stroke ([place]) 0000000   acute embolic stroke to right MCA territory   Tingling    left hand,left breast,and left hip   Urinary frequency    Urinary urgency     Her Past Surgical History Is Significant For: Past Surgical History:  Procedure Laterality Date   ABDOMINAL HYSTERECTOMY     COLONOSCOPY     CORONARY ARTERY BYPASS GRAFT N/A 07/10/2014   Procedure: CORONARY ARTERY BYPASS GRAFTING (CABG), ON PUMP, TIMES TWO, USING LEFT INTERNAL MAMMARY ARTERY, RIGHT GREATER SAPHENOUS VEIN HARVESTED ENDOSCOPICALLY;  Surgeon: Rexene Alberts, MD;  Location: [facility];  Service: Open Heart Surgery;  Laterality: N/A;   EXCISION OF ATRIAL MYXOMA N/A 07/10/2014   Procedure: RESECTION OF LEFT ATRIAL MASS;  Surgeon: Rexene Alberts, MD;  Location: [place];  Service: Open Heart Surgery;  Laterality: N/A;   EXTRACORPOREAL SHOCK WAVE LITHOTRIPSY Left 04/13/2017   Procedure: LEFT  EXTRACORPOREAL SHOCK WAVE LITHOTRIPSY (ESWL);  Surgeon: Bjorn Loser, MD;  Location: [facility];  Service: Urology;  Laterality: Left;   LEFT HEART CATHETERIZATION WITH CORONARY ANGIOGRAM N/A 07/04/2014   Procedure: LEFT HEART CATHETERIZATION WITH CORONARY ANGIOGRAM;  Surgeon: Jettie Booze, MD;  Location: [facility] CATH [facility];  Service: Cardiovascular;  Laterality: N/A;   RADIOLOGY WITH ANESTHESIA N/A 12/23/2014   Procedure: Stent supported coil embolization OR right ACA aneurysm;  Surgeon: Consuella Lose, MD;  Location: [facility];  Service: Radiology;  Laterality: N/A;   REPAIR OF PATENT FORAMEN OVALE N/A 07/10/2014   Procedure: REPAIR OF PATENT FORAMEN OVALE;  Surgeon: Rexene Alberts, MD;  Location: [facility];  Service: Open Heart Surgery;  Laterality: N/A;   TEE WITHOUT CARDIOVERSION N/A 07/03/2014   Procedure: TRANSESOPHAGEAL ECHOCARDIOGRAM (TEE);  Surgeon: Jerline Pain, MD;  Location: [place];  Service: Cardiovascular;  Laterality: N/A;   TEE WITHOUT CARDIOVERSION N/A 07/10/2014   Procedure: TRANSESOPHAGEAL ECHOCARDIOGRAM (TEE);  Surgeon: Rexene Alberts, MD;  Location: [facility];  Service: Open Heart Surgery;  Laterality: N/A;    Her Family History Is Significant For: Family History  Problem Relation Age of Onset   Hypertension Mother    Heart failure Mother  Deceased   Heart attack Mother    Cerebral aneurysm Father 62       Deceased   Aneurysm Father    Breast cancer Maternal Grandmother    Heart attack Maternal Grandfather    Hypertension Sister    Hypertension Brother    Heart attack Daughter    Stroke Neg Hx     Her Social History Is Significant For: Social History   Socioeconomic History   Marital status: Divorced    Spouse name: Not on file   Number of children: 3   Years of education: college   Highest education level: Not on file  Occupational History    Comment: retired Pharmacist, hospital  Tobacco Use   Smoking status: Former    Types: Cigarettes    Quit date:  07/01/2014    Years since quitting: 7.9   Smokeless tobacco: Never   Tobacco comments:    Uses nicotiene losenges  Vaping Use   Vaping Use: Never used  Substance and Sexual Activity   Alcohol use: Yes    Alcohol/week: 1.0 standard drink of alcohol    Types: 1 Cans of beer per week    Comment: rarely   Drug use: No   Sexual activity: Not on file  Other Topics Concern   Not on file  Social History Narrative   Patient lives at home alone and she is divorced.   Retired Pharmacist, hospital.   Education college.   Right handed.   Caffeine coffee two cups.   Social Determinants of Health   Financial Resource Strain: Not on file  Food Insecurity: No Food Insecurity (01/26/2022)   Hunger Vital Sign    Worried About Running Out of Food in the Last Year: Never true    Ran Out of Food in the Last Year: Never true  Transportation Needs: No Transportation Needs (01/26/2022)   PRAPARE - Hydrologist (Medical): No    Lack of Transportation (Non-Medical): No  Physical Activity: Not on file  Stress: Not on file  Social Connections: Not on file    Her Allergies Are:  Allergies  Allergen Reactions   Sulfa Antibiotics     Childhood allergy - unknown  :   Her Current Medications Are:  Outpatient Encounter Medications as of 06/16/2022  Medication Sig   amLODipine (NORVASC) 5 MG tablet TAKE 1 TABLET BY MOUTH EVERY DAY   ASPIRIN 81 PO Take 81 mg by mouth daily.    atorvastatin (LIPITOR) 40 MG tablet Take 1 tablet (40 mg total) by mouth daily.   CALCIUM PO Take 2 tablets by mouth daily.   Cholecalciferol (VITAMIN D) 50 MCG (2000 UT) tablet Take 2,000 Units by mouth daily. OTC   hydrochlorothiazide (MICROZIDE) 12.5 MG capsule TAKE 1 CAPSULE BY MOUTH EVERY DAY. - Pt needs to keep upcoming appt in Jan, 2024 for further refills   KRILL OIL PO Take 1 capsule by mouth daily.   lisinopril (ZESTRIL) 10 MG tablet Take 1 tablet (10 mg total) by mouth daily.    loratadine-pseudoephedrine (CLARITIN-D 12 HOUR) 5-120 MG tablet Take 1 tablet by mouth as needed for allergies.   MELATONIN PO Take 10 mg by mouth daily.   metFORMIN (GLUCOPHAGE) 500 MG tablet Take 500 mg by mouth 2 (two) times daily with a meal.   Misc Natural Products (OSTEO BI-FLEX TRIPLE STRENGTH PO) Take by mouth.   Multiple Vitamin (MULTIVITAMIN) tablet Take 1 tablet by mouth daily.   nicotine polacrilex (COMMIT) 2 MG  lozenge Take 2 mg by mouth as needed for smoking cessation.   OVER THE COUNTER MEDICATION Take by mouth in the morning and at bedtime. OSTEOBIFLEX W/ TUMERIC   polyethylene glycol (MIRALAX / GLYCOLAX) 17 g packet Take 17 g by mouth daily.   No facility-administered encounter medications on file as of 06/16/2022.  :  Review of Systems:  Out of a complete 14 point review of systems, all are reviewed and negative with the exception of these symptoms as listed below:  Virtual Visit via Video Note on 06/16/2022:   I connected with Milus Glazier on 06/16/22 at  7:45 AM EST by a video enabled telemedicine application and verified that I am speaking with the correct person using two identifiers.   I discussed the limitations of evaluation and management by telemedicine and the availability of in person appointments. The patient expressed understanding and agreed to proceed.  History of Present Illness: See above.   Observations/Objective: Pleasant, conversant, no acute distress, able to talk in full sentences, no labored or noisy breathing.  Normal facial symmetry and normal facial animation, corrective eyeglasses in place, good tracking, airway examination shows mild mouth dryness, tongue protrudes centrally.  Upper body movements unremarkable.  Assessment and Plan: In summary, Tricia Usery is a very pleasant 72 year old female with an underlying medical history of hypertension, ACA aneurysm (s/p elective stent-supported coil embolization under Dr. [name] on 12/23/14),  coronary artery disease (s/p 2 vessel CABG on 07/10/2014), papillary fibroblastoma of the heart, stroke in March 2016, hyperlipidemia, prior smoking, history of kidney stone, and obesity, who presents for MyChart video visit for follow-up consultation of her obstructive sleep apnea, well established on CPAP at 9 cm after starting treatment with a new machine since December 2022.  She continues to have full compliance and good results.  She has a history of moderate obstructive sleep apnea.  She is working on weight loss and has lost quite a bit of weight through her weight management clinic, of note, she started Ozempic about 2 weeks ago.  She is advised to follow-up in this clinic to see one of our nurse practitioners routinely in 1 year.  We could alternate in office and virtual visits.  She did not need any supplies currently.  I answered all her questions today and she was in agreement.    Follow Up Instructions:  I year with NP for routine check up of OSA.   I discussed the assessment and treatment plan with the patient. The patient was provided an opportunity to ask questions and all were answered. The patient agreed with the plan and demonstrated an understanding of the instructions.   The patient was advised to call back or seek an in-person evaluation if the symptoms worsen or if the condition fails to improve as anticipated.  I provided 20 minutes of non-face-to-face time during this encounter.   Star Age, MD

## 2022-06-16 NOTE — Patient Instructions (Addendum)
Please continue using your CPAP regularly. While your insurance requires that you use CPAP at least 4 hours each night on 70% of the nights, I recommend, that you not skip any nights and use it throughout the night if you can. Getting used to CPAP and staying with the treatment long term does take time and patience and discipline. Untreated obstructive sleep apnea when it is moderate to severe can have an adverse impact on cardiovascular health and raise her risk for heart disease, arrhythmias, hypertension, congestive heart failure, stroke and diabetes. Untreated obstructive sleep apnea causes sleep disruption, nonrestorative sleep, and sleep deprivation. This can have an impact on your day to day functioning and cause daytime sleepiness and impairment of cognitive function, memory loss, mood disturbance, and problems focussing. Using CPAP regularly can improve these symptoms. We can see you in 1 year, you can Hannah Nielsen in this office again.

## 2022-06-16 NOTE — Telephone Encounter (Signed)
Sent mychart msg informing pt of follow up made with [facility]

## 2022-06-16 NOTE — Telephone Encounter (Signed)
Sent thru mychart epworth sleepiness scale for her appointment.

## 2022-06-28 DIAGNOSIS — E7800 Pure hypercholesterolemia, unspecified: Secondary | ICD-10-CM | POA: Diagnosis not present

## 2022-06-28 DIAGNOSIS — J449 Chronic obstructive pulmonary disease, unspecified: Secondary | ICD-10-CM | POA: Diagnosis not present

## 2022-06-28 DIAGNOSIS — I700 Atherosclerosis of aorta: Secondary | ICD-10-CM | POA: Diagnosis not present

## 2022-06-28 DIAGNOSIS — Z720 Tobacco use: Secondary | ICD-10-CM | POA: Diagnosis not present

## 2022-06-28 DIAGNOSIS — I2510 Atherosclerotic heart disease of native coronary artery without angina pectoris: Secondary | ICD-10-CM | POA: Diagnosis not present

## 2022-06-28 DIAGNOSIS — I10 Essential (primary) hypertension: Secondary | ICD-10-CM | POA: Diagnosis not present

## 2022-06-28 DIAGNOSIS — E041 Nontoxic single thyroid nodule: Secondary | ICD-10-CM | POA: Diagnosis not present

## 2022-07-12 DIAGNOSIS — Z6834 Body mass index (BMI) 34.0-34.9, adult: Secondary | ICD-10-CM | POA: Diagnosis not present

## 2022-07-12 DIAGNOSIS — I10 Essential (primary) hypertension: Secondary | ICD-10-CM | POA: Diagnosis not present

## 2022-07-12 DIAGNOSIS — E669 Obesity, unspecified: Secondary | ICD-10-CM | POA: Diagnosis not present

## 2022-07-12 DIAGNOSIS — E7800 Pure hypercholesterolemia, unspecified: Secondary | ICD-10-CM | POA: Diagnosis not present

## 2022-07-12 DIAGNOSIS — R7303 Prediabetes: Secondary | ICD-10-CM | POA: Diagnosis not present

## 2022-07-15 DIAGNOSIS — G4733 Obstructive sleep apnea (adult) (pediatric): Secondary | ICD-10-CM | POA: Diagnosis not present

## 2022-08-09 DIAGNOSIS — R7303 Prediabetes: Secondary | ICD-10-CM | POA: Diagnosis not present

## 2022-08-09 DIAGNOSIS — E669 Obesity, unspecified: Secondary | ICD-10-CM | POA: Diagnosis not present

## 2022-08-09 DIAGNOSIS — I10 Essential (primary) hypertension: Secondary | ICD-10-CM | POA: Diagnosis not present

## 2022-08-09 DIAGNOSIS — E7800 Pure hypercholesterolemia, unspecified: Secondary | ICD-10-CM | POA: Diagnosis not present

## 2022-08-09 DIAGNOSIS — Z6834 Body mass index (BMI) 34.0-34.9, adult: Secondary | ICD-10-CM | POA: Diagnosis not present

## 2022-08-31 DIAGNOSIS — G4733 Obstructive sleep apnea (adult) (pediatric): Secondary | ICD-10-CM | POA: Diagnosis not present

## 2022-09-08 DIAGNOSIS — I10 Essential (primary) hypertension: Secondary | ICD-10-CM | POA: Diagnosis not present

## 2022-09-08 DIAGNOSIS — R7303 Prediabetes: Secondary | ICD-10-CM | POA: Diagnosis not present

## 2022-09-08 DIAGNOSIS — E7800 Pure hypercholesterolemia, unspecified: Secondary | ICD-10-CM | POA: Diagnosis not present

## 2022-09-08 DIAGNOSIS — E669 Obesity, unspecified: Secondary | ICD-10-CM | POA: Diagnosis not present

## 2022-09-08 DIAGNOSIS — Z6833 Body mass index (BMI) 33.0-33.9, adult: Secondary | ICD-10-CM | POA: Diagnosis not present

## 2022-09-14 ENCOUNTER — Other Ambulatory Visit (HOSPITAL_BASED_OUTPATIENT_CLINIC_OR_DEPARTMENT_OTHER): Payer: Self-pay

## 2022-09-14 MED ORDER — OZEMPIC (0.25 OR 0.5 MG/DOSE) 2 MG/3ML ~~LOC~~ SOPN
0.5000 mg | PEN_INJECTOR | SUBCUTANEOUS | 0 refills | Status: DC
  Filled 2022-09-14: qty 6, 56d supply, fill #0

## 2022-09-27 LAB — GLUCOSE, POCT (MANUAL RESULT ENTRY): Glucose Fasting, POC: 105 mg/dL — AB (ref 70–99)

## 2022-10-10 ENCOUNTER — Other Ambulatory Visit (HOSPITAL_BASED_OUTPATIENT_CLINIC_OR_DEPARTMENT_OTHER): Payer: Self-pay

## 2022-10-10 DIAGNOSIS — Z6832 Body mass index (BMI) 32.0-32.9, adult: Secondary | ICD-10-CM | POA: Diagnosis not present

## 2022-10-10 DIAGNOSIS — R7303 Prediabetes: Secondary | ICD-10-CM | POA: Diagnosis not present

## 2022-10-10 DIAGNOSIS — E669 Obesity, unspecified: Secondary | ICD-10-CM | POA: Diagnosis not present

## 2022-10-10 DIAGNOSIS — I10 Essential (primary) hypertension: Secondary | ICD-10-CM | POA: Diagnosis not present

## 2022-10-10 DIAGNOSIS — E7800 Pure hypercholesterolemia, unspecified: Secondary | ICD-10-CM | POA: Diagnosis not present

## 2022-10-10 MED ORDER — OZEMPIC (0.25 OR 0.5 MG/DOSE) 2 MG/3ML ~~LOC~~ SOPN
0.5000 mg | PEN_INJECTOR | SUBCUTANEOUS | 0 refills | Status: DC
  Filled 2022-10-10 – 2022-11-02 (×2): qty 9, 84d supply, fill #0

## 2022-11-02 ENCOUNTER — Other Ambulatory Visit (HOSPITAL_COMMUNITY): Payer: Self-pay

## 2022-11-02 ENCOUNTER — Other Ambulatory Visit (HOSPITAL_BASED_OUTPATIENT_CLINIC_OR_DEPARTMENT_OTHER): Payer: Self-pay

## 2022-11-17 DIAGNOSIS — E669 Obesity, unspecified: Secondary | ICD-10-CM | POA: Diagnosis not present

## 2022-11-17 DIAGNOSIS — E7800 Pure hypercholesterolemia, unspecified: Secondary | ICD-10-CM | POA: Diagnosis not present

## 2022-11-17 DIAGNOSIS — Z6832 Body mass index (BMI) 32.0-32.9, adult: Secondary | ICD-10-CM | POA: Diagnosis not present

## 2022-11-17 DIAGNOSIS — R7303 Prediabetes: Secondary | ICD-10-CM | POA: Diagnosis not present

## 2022-11-17 DIAGNOSIS — I10 Essential (primary) hypertension: Secondary | ICD-10-CM | POA: Diagnosis not present

## 2022-12-15 ENCOUNTER — Other Ambulatory Visit (HOSPITAL_BASED_OUTPATIENT_CLINIC_OR_DEPARTMENT_OTHER): Payer: Self-pay

## 2022-12-15 DIAGNOSIS — Z6832 Body mass index (BMI) 32.0-32.9, adult: Secondary | ICD-10-CM | POA: Diagnosis not present

## 2022-12-15 DIAGNOSIS — I10 Essential (primary) hypertension: Secondary | ICD-10-CM | POA: Diagnosis not present

## 2022-12-15 DIAGNOSIS — E669 Obesity, unspecified: Secondary | ICD-10-CM | POA: Diagnosis not present

## 2022-12-15 DIAGNOSIS — R7303 Prediabetes: Secondary | ICD-10-CM | POA: Diagnosis not present

## 2022-12-15 MED ORDER — OZEMPIC (1 MG/DOSE) 4 MG/3ML ~~LOC~~ SOPN
1.0000 mg | PEN_INJECTOR | SUBCUTANEOUS | 0 refills | Status: DC
  Filled 2022-12-15: qty 9, 84d supply, fill #0

## 2023-01-02 ENCOUNTER — Other Ambulatory Visit: Payer: Self-pay | Admitting: Internal Medicine

## 2023-01-02 DIAGNOSIS — I700 Atherosclerosis of aorta: Secondary | ICD-10-CM | POA: Diagnosis not present

## 2023-01-02 DIAGNOSIS — Z79899 Other long term (current) drug therapy: Secondary | ICD-10-CM | POA: Diagnosis not present

## 2023-01-02 DIAGNOSIS — Z8673 Personal history of transient ischemic attack (TIA), and cerebral infarction without residual deficits: Secondary | ICD-10-CM | POA: Diagnosis not present

## 2023-01-02 DIAGNOSIS — G4730 Sleep apnea, unspecified: Secondary | ICD-10-CM | POA: Diagnosis not present

## 2023-01-02 DIAGNOSIS — R7301 Impaired fasting glucose: Secondary | ICD-10-CM | POA: Diagnosis not present

## 2023-01-02 DIAGNOSIS — E7800 Pure hypercholesterolemia, unspecified: Secondary | ICD-10-CM | POA: Diagnosis not present

## 2023-01-02 DIAGNOSIS — E041 Nontoxic single thyroid nodule: Secondary | ICD-10-CM | POA: Diagnosis not present

## 2023-01-02 DIAGNOSIS — R911 Solitary pulmonary nodule: Secondary | ICD-10-CM

## 2023-01-02 DIAGNOSIS — Z23 Encounter for immunization: Secondary | ICD-10-CM | POA: Diagnosis not present

## 2023-01-02 DIAGNOSIS — Z0000 Encounter for general adult medical examination without abnormal findings: Secondary | ICD-10-CM | POA: Diagnosis not present

## 2023-01-02 DIAGNOSIS — I10 Essential (primary) hypertension: Secondary | ICD-10-CM | POA: Diagnosis not present

## 2023-01-02 DIAGNOSIS — J439 Emphysema, unspecified: Secondary | ICD-10-CM | POA: Diagnosis not present

## 2023-01-02 DIAGNOSIS — Z1331 Encounter for screening for depression: Secondary | ICD-10-CM | POA: Diagnosis not present

## 2023-01-10 DIAGNOSIS — Z6832 Body mass index (BMI) 32.0-32.9, adult: Secondary | ICD-10-CM | POA: Diagnosis not present

## 2023-01-10 DIAGNOSIS — E669 Obesity, unspecified: Secondary | ICD-10-CM | POA: Diagnosis not present

## 2023-01-10 DIAGNOSIS — I10 Essential (primary) hypertension: Secondary | ICD-10-CM | POA: Diagnosis not present

## 2023-01-10 DIAGNOSIS — R7303 Prediabetes: Secondary | ICD-10-CM | POA: Diagnosis not present

## 2023-01-11 DIAGNOSIS — E041 Nontoxic single thyroid nodule: Secondary | ICD-10-CM | POA: Diagnosis not present

## 2023-01-13 ENCOUNTER — Ambulatory Visit
Admission: RE | Admit: 2023-01-13 | Discharge: 2023-01-13 | Disposition: A | Discharge status: Home / Self Care | Payer: Medicare PPO | Source: Ambulatory Visit | Attending: Internal Medicine | Admitting: Internal Medicine

## 2023-01-13 DIAGNOSIS — R911 Solitary pulmonary nodule: Secondary | ICD-10-CM

## 2023-01-13 DIAGNOSIS — J439 Emphysema, unspecified: Secondary | ICD-10-CM | POA: Diagnosis not present

## 2023-01-18 DIAGNOSIS — R748 Abnormal levels of other serum enzymes: Secondary | ICD-10-CM | POA: Diagnosis not present

## 2023-02-07 DIAGNOSIS — I10 Essential (primary) hypertension: Secondary | ICD-10-CM | POA: Diagnosis not present

## 2023-02-07 DIAGNOSIS — R7303 Prediabetes: Secondary | ICD-10-CM | POA: Diagnosis not present

## 2023-02-07 DIAGNOSIS — E669 Obesity, unspecified: Secondary | ICD-10-CM | POA: Diagnosis not present

## 2023-02-07 DIAGNOSIS — Z6831 Body mass index (BMI) 31.0-31.9, adult: Secondary | ICD-10-CM | POA: Diagnosis not present

## 2023-02-24 ENCOUNTER — Other Ambulatory Visit: Payer: Self-pay | Admitting: Internal Medicine

## 2023-03-02 ENCOUNTER — Other Ambulatory Visit (HOSPITAL_BASED_OUTPATIENT_CLINIC_OR_DEPARTMENT_OTHER): Payer: Self-pay

## 2023-03-02 DIAGNOSIS — I10 Essential (primary) hypertension: Secondary | ICD-10-CM | POA: Diagnosis not present

## 2023-03-02 DIAGNOSIS — R7303 Prediabetes: Secondary | ICD-10-CM | POA: Diagnosis not present

## 2023-03-02 DIAGNOSIS — Z6831 Body mass index (BMI) 31.0-31.9, adult: Secondary | ICD-10-CM | POA: Diagnosis not present

## 2023-03-02 DIAGNOSIS — E669 Obesity, unspecified: Secondary | ICD-10-CM | POA: Diagnosis not present

## 2023-03-02 MED ORDER — OZEMPIC (2 MG/DOSE) 8 MG/3ML ~~LOC~~ SOPN
2.0000 | PEN_INJECTOR | SUBCUTANEOUS | 1 refills | Status: DC
  Filled 2023-03-02: qty 3, 28d supply, fill #0
  Filled 2023-04-06: qty 3, 28d supply, fill #1

## 2023-03-03 ENCOUNTER — Other Ambulatory Visit (HOSPITAL_COMMUNITY): Payer: Self-pay

## 2023-03-03 ENCOUNTER — Other Ambulatory Visit: Payer: Self-pay | Admitting: Internal Medicine

## 2023-03-06 MED ORDER — HYDROCHLOROTHIAZIDE 12.5 MG PO CAPS: ORAL_CAPSULE | ORAL | 0 refills | Status: DC

## 2023-03-06 NOTE — Addendum Note (Signed)
Addended by: Daphane Shepherd on: 03/06/2023 09:34 AM   Modules accepted: Orders

## 2023-03-14 ENCOUNTER — Other Ambulatory Visit: Payer: Self-pay | Admitting: Internal Medicine

## 2023-04-06 ENCOUNTER — Other Ambulatory Visit (HOSPITAL_BASED_OUTPATIENT_CLINIC_OR_DEPARTMENT_OTHER): Payer: Self-pay

## 2023-04-09 ENCOUNTER — Other Ambulatory Visit: Payer: Self-pay | Admitting: Internal Medicine

## 2023-04-18 ENCOUNTER — Other Ambulatory Visit: Payer: Self-pay | Admitting: Internal Medicine

## 2023-04-24 DIAGNOSIS — G4733 Obstructive sleep apnea (adult) (pediatric): Secondary | ICD-10-CM | POA: Diagnosis not present

## 2023-04-24 DIAGNOSIS — Z1231 Encounter for screening mammogram for malignant neoplasm of breast: Secondary | ICD-10-CM | POA: Diagnosis not present

## 2023-04-25 DIAGNOSIS — E669 Obesity, unspecified: Secondary | ICD-10-CM | POA: Diagnosis not present

## 2023-04-25 DIAGNOSIS — R7303 Prediabetes: Secondary | ICD-10-CM | POA: Diagnosis not present

## 2023-04-25 DIAGNOSIS — Z6831 Body mass index (BMI) 31.0-31.9, adult: Secondary | ICD-10-CM | POA: Diagnosis not present

## 2023-04-25 DIAGNOSIS — I10 Essential (primary) hypertension: Secondary | ICD-10-CM | POA: Diagnosis not present

## 2023-05-01 NOTE — Progress Notes (Unsigned)
Cardiology Office Note   Date:  05/02/2023   ID:  Hannah Nielsen, DOB 23-May-1950, MRN [id number]  PCP:  Hannah Ates, MD  Cardiologist:   Hannah Pates, MD   F/U of CAD and HTN    History of Present Illness: Hannah Nielsen is a 73 y.o. 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   Pt with CAD  Undwent removal of fibrolastoma and CABG (LIMA to LAD; SVG to RCA) as well as PFO closure  All done in 2016    Echo in 2022 LVEF normal  Normal valve function  I saw the pt in Jan 2024 Jan 2024 myoview showed normal perfusion     Since seen the pt has lost weight   Working on diet   Says she is feeling much better  Breathing is better   Going to [employer]     She denies CP   No SOB now   no palpitations  The pt says her BP was running a little low and PCP stopped HCTZ  Current Meds  Medication Sig   amLODipine (NORVASC) 5 MG tablet TAKE 1 TABLET BY MOUTH EVERY DAY   ASPIRIN 81 PO Take 81 mg by mouth daily.    CALCIUM PO Take 2 tablets by mouth daily.   Cholecalciferol (VITAMIN D) 50 MCG (2000 UT) tablet Take 2,000 Units by mouth daily. OTC   hydrochlorothiazide (MICROZIDE) 12.5 MG capsule TAKE 1 CAPSULE BY MOUTH EVERY DAY   KRILL OIL PO Take 1 capsule by mouth daily.   lisinopril (ZESTRIL) 10 MG tablet TAKE 1 TABLET BY MOUTH EVERY DAY   loratadine-pseudoephedrine (CLARITIN-D 12 HOUR) 5-120 MG tablet Take 1 tablet by mouth as needed for allergies.   MELATONIN PO Take 10 mg by mouth daily.   metFORMIN (GLUCOPHAGE) 500 MG tablet Take 500 mg by mouth 2 (two) times daily with a meal.   Multiple Vitamin (MULTIVITAMIN) tablet Take 1 tablet by mouth daily.   OVER THE COUNTER MEDICATION Take by mouth in the morning and at bedtime. OSTEOBIFLEX W/ TUMERIC   polyethylene glycol (MIRALAX / GLYCOLAX) 17 g packet Take 17 g by mouth daily.   Semaglutide, 2 MG/DOSE, (OZEMPIC, 2 MG/DOSE,) 8 MG/3ML SOPN Inject 2 m into the skin every 7 (seven) days.     Allergies:    Sulfa antibiotics   Past Medical History:  Diagnosis Date   Aneurysm of anterior cerebral artery: Right per MRI 07/01/14 07/02/2014   4.4 mm   CAD (coronary artery disease), native coronary artery: Severe 2 vessel dz per cardiac cath 07/04/14 07/04/2014   Cataract    left immature   Constipation    takes Miralax daily as needed   GERD (gastroesophageal reflux disease)    rarely takes Tums   History of colon polyps    benign   History of kidney stones    Hyperlipidemia 07/02/2014   takes Lipitor daily   Hypertension    takes Amlodipine and HCTZ daily   Insomnia    Left atrial mass 07/03/2014   benign papillary fibroelastoma   Nocturia    takes Ditropan nightly   Papillary fibroelastoma of heart    S/P resection of left atrial benign papillary fibroelastoma and CABG x 2 07/10/2014   LIMA to LAD, SVG to RCA, EVH via right thigh   Shortness of breath dyspnea    with exertion    Sleep apnea    uses cpap  Stroke (HCC) 07/01/2014   acute embolic stroke to right MCA territory   Tingling    left hand,left breast,and left hip   Urinary frequency    Urinary urgency     Past Surgical History:  Procedure Laterality Date   ABDOMINAL HYSTERECTOMY     COLONOSCOPY     CORONARY ARTERY BYPASS GRAFT N/A 07/10/2014   Procedure: CORONARY ARTERY BYPASS GRAFTING (CABG), ON PUMP, TIMES TWO, USING LEFT INTERNAL MAMMARY ARTERY, RIGHT GREATER SAPHENOUS VEIN HARVESTED ENDOSCOPICALLY;  Surgeon: Purcell Nails, MD;  Location: [facility];  Service: Open Heart Surgery;  Laterality: N/A;   EXCISION OF ATRIAL MYXOMA N/A 07/10/2014   Procedure: RESECTION OF LEFT ATRIAL MASS;  Surgeon: Purcell Nails, MD;  Location: [facility] OR;  Service: Open Heart Surgery;  Laterality: N/A;   EXTRACORPOREAL SHOCK WAVE LITHOTRIPSY Left 04/13/2017   Procedure: LEFT EXTRACORPOREAL SHOCK WAVE LITHOTRIPSY (ESWL);  Surgeon: Alfredo Martinez, MD;  Location: [facility] ORS;  Service: Urology;  Laterality: Left;   LEFT HEART CATHETERIZATION WITH  CORONARY ANGIOGRAM N/A 07/04/2014   Procedure: LEFT HEART CATHETERIZATION WITH CORONARY ANGIOGRAM;  Surgeon: Corky Crafts, MD;  Location[facility] C[facility] LAB;  Service: Cardiovascular;  Laterality: N/A;   RADIOLOGY WITH ANESTHESIA N/A 12/23/2014   Procedure: Stent supported coil embolization OR right ACA aneurysm;  Surgeon: Lisbeth Renshaw, MD;  [facility] NEURO [facility]S;  Service: Radiology;  Laterality: N/A;   REPAIR OF PATENT FORAMEN OVALE N/A 07/10/2014   Procedure: REPAIR OF PATENT FORAMEN OVALE;  Surgeon: Purcell Nails, MD;  [facility];  Service: Open Heart Surgery;  Laterality: N/A;   TEE WITHOUT CARDIOVERSION N/A 07/03/2014   Procedure: TRANSESOPHAGEAL ECHOCARDIOGRAM (TEE);  Surgeon: Jake Bathe, MD;  Location: [facility] ENDOSCOPY;  Service: Cardiovascular;  Laterality: N/A;   TEE WITHOUT CARDIOVERSION N/A 07/10/2014   Procedure: TRANSESOPHAGEAL ECHOCARDIOGRAM (TEE);  Surgeon: Purcell Nails, MD;  Location: [facility] OR;  Service: Open Heart Surgery;  Laterality: N/A;     Social History:  The patient  reports that she quit smoking about 8 years ago. Her smoking use included cigarettes. She has never used smokeless tobacco. She reports current alcohol use of about 1.0 standard drink of alcohol per week. She reports that she does not use drugs.   Family History:  The patient's family history includes Aneurysm in her father; Breast cancer in her maternal grandmother; Cerebral aneurysm (age of onset: 47) in her father; Heart attack in her daughter, maternal grandfather, and mother; Heart failure in her mother; Hypertension in her brother, mother, and sister.    ROS:  Please see the history of present illness. All other systems are reviewed and  Negative to the above problem except as noted.    PHYSICAL EXAM: VS:  BP 120/62   Pulse 70   Ht 5\' 2"  (1.575 m)   Wt 175 lb (79.4 kg)   SpO2 93%   BMI 32.01 kg/m   GEN: Morbidly obese 73 yo  in no acute distress  HEENT: normal  Neck: no JVD,Cardiac:  RRR; no murmurs ,no edema  Respiratory:  clear to auscultation GI: soft, nontender, nondistended  No hepatomegaly    EKG:  EKG is ordered today.    SR 70 bpm   RBBB  LAFB    Echo    1. Left ventricular ejection fraction, by estimation, is 60 to 65%. Left  ventricular ejection fraction by 3D volume is 63 %. The left ventricle has  normal function. The left ventricle has no regional  wall motion  abnormalities. Left ventricular diastolic   parameters were normal.   2. Right ventricular systolic function is normal. The right ventricular  size is normal. There is normal pulmonary artery systolic pressure. The  estimated right ventricular systolic pressure is 27.8 mmHg.   3. The mitral valve is normal in structure. Trivial mitral valve  regurgitation. No evidence of mitral stenosis.   4. The aortic valve is tricuspid. Aortic valve regurgitation is not  visualized. Mild aortic valve sclerosis is present, with no evidence of  aortic valve stenosis.   5. The inferior vena cava is normal in size with greater than 50%  respiratory variability, suggesting right atrial pressure of 3 mmHg.   6. S/P LA myoxma resection.   7. S/P PFO repair with no evidence of shunt by coloflow doppler.   Myoview May 2021 There was no ST segment deviation noted during stress. The left ventricular ejection fraction is hyperdynamic (>65%). Nuclear stress EF: 70%. The study is normal. This is a low risk study.  Lipid Panel    Component Value Date/Time   CHOL 120 07/16/2020 0926   TRIG 142 07/16/2020 0926   HDL 39 (L) 07/16/2020 0926   CHOLHDL 3.1 07/16/2020 0926   CHOLHDL 2.5 01/04/2016 1004   VLDL 26 01/04/2016 1004   LDLCALC 56 07/16/2020 0926      Wt Readings from Last 3 Encounters:  05/02/23 175 lb (79.4 kg)  05/09/22 206 lb (93.4 kg)  05/02/22 206 lb 12.8 oz (93.8 kg)      ASSESSMENT AND PLAN:  1  CAD   CABG in 2016 (LIMA to LAD; SVG to RCA)  No symptoms to sugg ischemia     Doing better  with wt loss   Follow       2.  HTN  BP remains controlled on current regimen  Continue      3 HL  Lipids are excellent  LDL was 73  HDL 43 in Sept 2024   Follow with weight loss   No changes for now     4  Obesity  Congratulated on losses   Keep it up    F/U in December    Current medicines are reviewed at length with the patient today.  The patient does not have concerns regarding medicines.  Signed, Hannah Pates, MD  05/02/2023 10:25 AM    [facility] [address] [place], [place], [place]  [address] Phone: [phone]; Fax: [phone]

## 2023-05-02 ENCOUNTER — Encounter: Payer: Self-pay | Admitting: Internal Medicine

## 2023-05-02 ENCOUNTER — Ambulatory Visit: Payer: Medicare PPO | Attending: Internal Medicine | Admitting: Internal Medicine

## 2023-05-02 VITALS — BP 120/62 | HR 70 | Ht 62.0 in | Wt 175.0 lb

## 2023-05-02 DIAGNOSIS — R0602 Shortness of breath: Secondary | ICD-10-CM

## 2023-05-02 NOTE — Patient Instructions (Signed)
Medication Instructions:  none *If you need a refill on your cardiac medications before your next appointment, please call your pharmacy*   Lab Work: none If you have labs (blood work) drawn today and your tests are completely normal, you will receive your results only by: MyChart Message (if you have MyChart) OR A paper copy in the mail If you have any lab test that is abnormal or we need to change your treatment, we will call you to review the results.   Testing/Procedures: none   Follow-Up: At [facility], you and your health needs are our priority.  As part of our continuing mission to provide you with exceptional heart care, we have created designated Provider Care Teams.  These Care Teams include your primary Cardiologist (physician) and Advanced Practice Providers (APPs -  Physician Assistants and Nurse Practitioners) who all work together to provide you with the care you need, when you need it.  We recommend signing up for the patient portal called "MyChart".  Sign up information is provided on this After Visit Summary.  MyChart is used to connect with patients for Virtual Visits (Telemedicine).  Patients are able to view lab/test results, encounter notes, upcoming appointments, etc.  Non-urgent messages can be sent to your provider as well.   To learn more about what you can do with MyChart, go to [email or web address].    Your next appointment:   Follow up in December    Provider:   Dietrich Pates, MD

## 2023-05-12 ENCOUNTER — Other Ambulatory Visit (HOSPITAL_BASED_OUTPATIENT_CLINIC_OR_DEPARTMENT_OTHER): Payer: Self-pay

## 2023-05-13 ENCOUNTER — Other Ambulatory Visit (HOSPITAL_BASED_OUTPATIENT_CLINIC_OR_DEPARTMENT_OTHER): Payer: Self-pay

## 2023-05-15 ENCOUNTER — Other Ambulatory Visit (HOSPITAL_BASED_OUTPATIENT_CLINIC_OR_DEPARTMENT_OTHER): Payer: Self-pay

## 2023-05-15 MED ORDER — OZEMPIC (2 MG/DOSE) 8 MG/3ML ~~LOC~~ SOPN
2.0000 mg | PEN_INJECTOR | SUBCUTANEOUS | 1 refills | Status: AC
  Filled 2023-05-15: qty 3, 28d supply, fill #0

## 2023-05-18 DIAGNOSIS — R209 Unspecified disturbances of skin sensation: Secondary | ICD-10-CM | POA: Diagnosis not present

## 2023-05-29 ENCOUNTER — Other Ambulatory Visit (HOSPITAL_BASED_OUTPATIENT_CLINIC_OR_DEPARTMENT_OTHER): Payer: Self-pay

## 2023-06-06 ENCOUNTER — Other Ambulatory Visit: Payer: Self-pay | Admitting: Internal Medicine

## 2023-06-06 DIAGNOSIS — Z6830 Body mass index (BMI) 30.0-30.9, adult: Secondary | ICD-10-CM | POA: Diagnosis not present

## 2023-06-06 DIAGNOSIS — R7303 Prediabetes: Secondary | ICD-10-CM | POA: Diagnosis not present

## 2023-06-06 DIAGNOSIS — I10 Essential (primary) hypertension: Secondary | ICD-10-CM | POA: Diagnosis not present

## 2023-06-06 DIAGNOSIS — E669 Obesity, unspecified: Secondary | ICD-10-CM | POA: Diagnosis not present

## 2023-06-07 ENCOUNTER — Other Ambulatory Visit: Payer: Self-pay | Admitting: Internal Medicine

## 2023-06-12 NOTE — Progress Notes (Signed)
 Setup date 03/29/21

## 2023-06-13 ENCOUNTER — Encounter: Payer: Self-pay | Admitting: Adult Health

## 2023-06-13 ENCOUNTER — Ambulatory Visit: Admitting: Adult Health

## 2023-06-13 VITALS — BP 116/74 | HR 80 | Ht 62.5 in | Wt 172.8 lb

## 2023-06-13 DIAGNOSIS — G4733 Obstructive sleep apnea (adult) (pediatric): Secondary | ICD-10-CM

## 2023-06-13 NOTE — Patient Instructions (Signed)
Continue using CPAP nightly and greater than 4 hours each night Adjust humidification If your symptoms worsen or you develop new symptoms please let us know.

## 2023-06-13 NOTE — Progress Notes (Signed)
 PATIENT: Hannah Nielsen DOB: 1951/03/24  REASON FOR VISIT: follow up HISTORY FROM: patient PRIMARY NEUROLOGIST: Dr. Frances Furbish  Chief Complaint  Patient presents with   Follow-up    Rm 19, alone.  Tired of using machine.       HISTORY OF PRESENT ILLNESS: Today 06/13/23:  Hannah Nielsen is a 73 y.o. female with a history of OSA on CPAP . Returns today for follow-up.  Reports that CPAP works well except she experiences dry mouth.  She has not tried turning up the humidity on her new machine.  Reports that the old machine would make gurgling sounds when she increased her humidity.  In the past she has tried Biotene mouthwash.  She returns today for an evaluation.       REVIEW OF SYSTEMS: Out of a complete 14 system review of symptoms, the patient complains only of the following symptoms, and all other reviewed systems are negative.  FSS ESS  ALLERGIES: Allergies  Allergen Reactions   Sulfa Antibiotics     Childhood allergy - unknown    HOME MEDICATIONS: Outpatient Medications Prior to Visit  Medication Sig Dispense Refill   amLODipine (NORVASC) 5 MG tablet TAKE 1 TABLET BY MOUTH EVERY DAY 90 tablet 3   ASPIRIN 81 PO Take 81 mg by mouth daily.      atorvastatin (LIPITOR) 40 MG tablet TAKE 1 TABLET BY MOUTH ONCE DAILY 90 tablet 3   CALCIUM PO Take 2 tablets by mouth daily.     Cholecalciferol (VITAMIN D) 50 MCG (2000 UT) tablet Take 2,000 Units by mouth daily. OTC     KRILL OIL PO Take 1 capsule by mouth daily.     lisinopril (ZESTRIL) 10 MG tablet TAKE 1 TABLET BY MOUTH ONCE DAILY 90 tablet 3   loratadine-pseudoephedrine (CLARITIN-D 12 HOUR) 5-120 MG tablet Take 1 tablet by mouth as needed for allergies.     MELATONIN PO Take 10 mg by mouth daily.     metFORMIN (GLUCOPHAGE) 500 MG tablet Take 500 mg by mouth 2 (two) times daily with a meal.     Misc Natural Products (OSTEO BI-FLEX TRIPLE STRENGTH PO) Take by mouth.     Multiple Vitamin (MULTIVITAMIN) tablet Take 1 tablet  by mouth daily.     nicotine polacrilex (COMMIT) 2 MG lozenge Take 2 mg by mouth as needed for smoking cessation. (Patient not taking: Reported on 05/02/2023)     OVER THE COUNTER MEDICATION Take by mouth in the morning and at bedtime. OSTEOBIFLEX W/ TUMERIC     polyethylene glycol (MIRALAX / GLYCOLAX) 17 g packet Take 17 g by mouth daily.     Semaglutide, 2 MG/DOSE, (OZEMPIC, 2 MG/DOSE,) 8 MG/3ML SOPN Inject 2 mg into the skin once a week. 3 mL 1   Semaglutide,0.25 or 0.5MG /DOS, (OZEMPIC, 0.25 OR 0.5 MG/DOSE,) 2 MG/3ML SOPN Inject 0.5 mg into the skin once a week. (Patient not taking: Reported on 05/02/2023) 9 mL 0   No facility-administered medications prior to visit.    PAST MEDICAL HISTORY: Past Medical History:  Diagnosis Date   Aneurysm of anterior cerebral artery: Right per MRI 07/01/14 07/02/2014   4.4 mm   CAD (coronary artery disease), native coronary artery: Severe 2 vessel dz per cardiac cath 07/04/14 07/04/2014   Cataract    left immature   Constipation    takes Miralax daily as needed   GERD (gastroesophageal reflux disease)    rarely takes Tums   History of colon polyps  benign   History of kidney stones    Hyperlipidemia 07/02/2014   takes Lipitor daily   Hypertension    takes Amlodipine and HCTZ daily   Insomnia    Left atrial mass 07/03/2014   benign papillary fibroelastoma   Nocturia    takes Ditropan nightly   Papillary fibroelastoma of heart    S/P resection of left atrial benign papillary fibroelastoma and CABG x 2 07/10/2014   LIMA to LAD, SVG to RCA, EVH via right thigh   Shortness of breath dyspnea    with exertion    Sleep apnea    uses cpap   Stroke (HCC) 07/01/2014   acute embolic stroke to right MCA territory   Tingling    left hand,left breast,and left hip   Urinary frequency    Urinary urgency     PAST SURGICAL HISTORY: Past Surgical History:  Procedure Laterality Date   ABDOMINAL HYSTERECTOMY     COLONOSCOPY     CORONARY ARTERY BYPASS  GRAFT N/A 07/10/2014   Procedure: CORONARY ARTERY BYPASS GRAFTING (CABG), ON PUMP, TIMES TWO, USING LEFT INTERNAL MAMMARY ARTERY, RIGHT GREATER SAPHENOUS VEIN HARVESTED ENDOSCOPICALLY;  Surgeon: Purcell Nails, MD;  Location: [facility] OR;  Service: Open Heart Surgery;  Laterality: N/A;   EXCISION OF ATRIAL MYXOMA N/A 07/10/2014   Procedure: RESECTION OF LEFT ATRIAL MASS;  Surgeon: Purcell Nails, MD;  Location: [facility] OR;  Service: Open Heart Surgery;  Laterality: N/A;   EXTRACORPOREAL SHOCK WAVE LITHOTRIPSY Left 04/13/2017   Procedure: LEFT EXTRACORPOREAL SHOCK WAVE LITHOTRIPSY (ESWL);  Surgeon: Alfredo Martinez, MD;  Location: [facility] ORS;  Service: Urology;  Laterality: Left;   LEFT HEART CATHETERIZATION WITH CORONARY ANGIOGRAM N/A 07/04/2014   Procedure: LEFT HEART CATHETERIZATION WITH CORONARY ANGIOGRAM;  Surgeon: Corky Crafts, MD;  Location: [facility] CATH LAB;  Service: Cardiovascular;  Laterality: N/A;   RADIOLOGY WITH ANESTHESIA N/A 12/23/2014   Procedure: Stent supported coil embolization OR right ACA aneurysm;  Surgeon: Lisbeth Renshaw, MD;  Location: [facility] NEURO ORS;  Service: Radiology;  Laterality: N/A;   REPAIR OF PATENT FORAMEN OVALE N/A 07/10/2014   Procedure: REPAIR OF PATENT FORAMEN OVALE;  Surgeon: Purcell Nails, MD;  Location: [facility] OR;  Service: Open Heart Surgery;  Laterality: N/A;   TEE WITHOUT CARDIOVERSION N/A 07/03/2014   Procedure: TRANSESOPHAGEAL ECHOCARDIOGRAM (TEE);  Surgeon: Jake Bathe, MD;  Location: [facility] ENDOSCOPY;  Service: Cardiovascular;  Laterality: N/A;   TEE WITHOUT CARDIOVERSION N/A 07/10/2014   Procedure: TRANSESOPHAGEAL ECHOCARDIOGRAM (TEE);  Surgeon: Purcell Nails, MD;  Location: [facility] OR;  Service: Open Heart Surgery;  Laterality: N/A;    FAMILY HISTORY: Family History  Problem Relation Age of Onset   Hypertension Mother    Heart failure Mother        Deceased   Heart attack Mother    Cerebral aneurysm Father 46       Deceased   Aneurysm Father    Breast cancer  Maternal Grandmother    Heart attack Maternal Grandfather    Hypertension Sister    Hypertension Brother    Heart attack Daughter    Stroke Neg Hx     SOCIAL HISTORY: Social History   Socioeconomic History   Marital status: Divorced    Spouse name: Not on file   Number of children: 3   Years of education: college   Highest education level: Not on file  Occupational History    Comment: retired Runner, broadcasting/film/video  Tobacco Use   Smoking status: Former  Current packs/day: 0.00    Types: Cigarettes    Quit date: 07/01/2014    Years since quitting: 8.9   Smokeless tobacco: Never   Tobacco comments:    Uses nicotiene losenges  Vaping Use   Vaping status: Never Used  Substance and Sexual Activity   Alcohol use: Yes    Alcohol/week: 1.0 standard drink of alcohol    Types: 1 Cans of beer per week    Comment: rarely   Drug use: No   Sexual activity: Not on file  Other Topics Concern   Not on file  Social History Narrative   Patient lives at home alone and she is divorced.   Retired Runner, broadcasting/film/video.   Education college.   Right handed.   Caffeine coffee two cups.   Social Drivers of Corporate investment banker Strain: Not on file  Food Insecurity: No Food Insecurity (01/26/2022)   Hunger Vital Sign    Worried About Running Out of Food in the Last Year: Never true    Ran Out of Food in the Last Year: Never true  Transportation Needs: No Transportation Needs (01/26/2022)   PRAPARE - Administrator, Civil Service (Medical): No    Lack of Transportation (Non-Medical): No  Physical Activity: Not on file  Stress: Not on file  Social Connections: Not on file  Intimate Partner Violence: Not on file      PHYSICAL EXAM  There were no vitals filed for this visit. There is no height or weight on file to calculate BMI.  Generalized: Well developed, in no acute distress  Chest: Lungs clear to auscultation bilaterally  Neurological examination  Mentation: Alert oriented to  time, place, history taking. Follows all commands speech and language fluent Cranial nerve II-XII: Facial symmetry noted   DIAGNOSTIC DATA (LABS, IMAGING, TESTING) - I reviewed patient records, labs, notes, testing and imaging myself where available.  Lab Results  Component Value Date   WBC 10.2 01/04/2016   HGB 15.1 01/04/2016   HCT 44.1 01/04/2016   MCV 87.8 01/04/2016   PLT 333 01/04/2016      Component Value Date/Time   NA 140 01/04/2016 1004   K 4.3 01/04/2016 1004   CL 101 01/04/2016 1004   CO2 29 01/04/2016 1004   GLUCOSE 95 01/04/2016 1004   BUN 16 01/04/2016 1004   CREATININE 0.79 01/04/2016 1004   CALCIUM 9.9 01/04/2016 1004   PROT 7.0 09/22/2014 1035   ALBUMIN 3.9 09/22/2014 1035   AST 24 09/22/2014 1035   ALT 37 (H) 09/22/2014 1035   ALKPHOS 118 (H) 09/22/2014 1035   BILITOT 0.4 09/22/2014 1035   GFRNONAA >60 07/28/2015 0620   GFRAA >60 07/28/2015 0620   Lab Results  Component Value Date   CHOL 120 07/16/2020   HDL 39 (L) 07/16/2020   LDLCALC 56 07/16/2020   TRIG 142 07/16/2020   CHOLHDL 3.1 07/16/2020   Lab Results  Component Value Date   HGBA1C 5.9 (H) 07/16/2020       ASSESSMENT AND PLAN 73 y.o. year old female  has a past medical history of Aneurysm of anterior cerebral artery: Right per MRI 07/01/14 (07/02/2014), CAD (coronary artery disease), native coronary artery: Severe 2 vessel dz per cardiac cath 07/04/14 (07/04/2014), Cataract, Constipation, GERD (gastroesophageal reflux disease), History of colon polyps, History of kidney stones, Hyperlipidemia (07/02/2014), Hypertension, Insomnia, Left atrial mass (07/03/2014), Nocturia, Papillary fibroelastoma of heart, S/P resection of left atrial benign papillary fibroelastoma and CABG x 2 (07/10/2014),  Shortness of breath dyspnea, Sleep apnea, Stroke (HCC) (07/01/2014), Tingling, Urinary frequency, and Urinary urgency. here with:  OSA on CPAP  - CPAP compliance excellent - Good treatment of AHI  -  Encourage patient to use CPAP nightly and > 4 hours each night -Encouraged her to adjust her humidity - F/U in 1 year or sooner if needed    Butch Penny, MSN, NP-C 06/13/2023, 1:37 PM [facility] [address], [address] [place], [place] [address] [phone]

## 2023-06-14 ENCOUNTER — Ambulatory Visit: Payer: Medicare PPO | Admitting: Adult Health

## 2023-06-16 DIAGNOSIS — H2513 Age-related nuclear cataract, bilateral: Secondary | ICD-10-CM | POA: Diagnosis not present

## 2023-06-16 DIAGNOSIS — H5213 Myopia, bilateral: Secondary | ICD-10-CM | POA: Diagnosis not present

## 2023-07-18 DIAGNOSIS — E669 Obesity, unspecified: Secondary | ICD-10-CM | POA: Diagnosis not present

## 2023-07-18 DIAGNOSIS — I10 Essential (primary) hypertension: Secondary | ICD-10-CM | POA: Diagnosis not present

## 2023-07-18 DIAGNOSIS — R7303 Prediabetes: Secondary | ICD-10-CM | POA: Diagnosis not present

## 2023-07-18 DIAGNOSIS — Z6830 Body mass index (BMI) 30.0-30.9, adult: Secondary | ICD-10-CM | POA: Diagnosis not present

## 2023-09-05 DIAGNOSIS — E669 Obesity, unspecified: Secondary | ICD-10-CM | POA: Diagnosis not present

## 2023-09-05 DIAGNOSIS — R7303 Prediabetes: Secondary | ICD-10-CM | POA: Diagnosis not present

## 2023-09-05 DIAGNOSIS — I10 Essential (primary) hypertension: Secondary | ICD-10-CM | POA: Diagnosis not present

## 2023-09-05 DIAGNOSIS — Z6830 Body mass index (BMI) 30.0-30.9, adult: Secondary | ICD-10-CM | POA: Diagnosis not present

## 2023-10-17 DIAGNOSIS — I10 Essential (primary) hypertension: Secondary | ICD-10-CM | POA: Diagnosis not present

## 2023-10-17 DIAGNOSIS — Z6830 Body mass index (BMI) 30.0-30.9, adult: Secondary | ICD-10-CM | POA: Diagnosis not present

## 2023-10-17 DIAGNOSIS — R7303 Prediabetes: Secondary | ICD-10-CM | POA: Diagnosis not present

## 2023-10-17 DIAGNOSIS — E669 Obesity, unspecified: Secondary | ICD-10-CM | POA: Diagnosis not present

## 2024-01-09 ENCOUNTER — Other Ambulatory Visit (HOSPITAL_COMMUNITY): Payer: Self-pay | Admitting: Internal Medicine

## 2024-01-09 DIAGNOSIS — Z87891 Personal history of nicotine dependence: Secondary | ICD-10-CM

## 2024-01-17 ENCOUNTER — Ambulatory Visit (HOSPITAL_BASED_OUTPATIENT_CLINIC_OR_DEPARTMENT_OTHER)
Admission: RE | Admit: 2024-01-17 | Discharge: 2024-01-17 | Disposition: A | Discharge status: Home / Self Care | Source: Ambulatory Visit | Attending: Internal Medicine | Admitting: Internal Medicine

## 2024-01-17 ENCOUNTER — Other Ambulatory Visit (HOSPITAL_BASED_OUTPATIENT_CLINIC_OR_DEPARTMENT_OTHER): Payer: Self-pay

## 2024-01-17 DIAGNOSIS — J439 Emphysema, unspecified: Secondary | ICD-10-CM | POA: Insufficient documentation

## 2024-01-17 DIAGNOSIS — R911 Solitary pulmonary nodule: Secondary | ICD-10-CM | POA: Diagnosis not present

## 2024-01-17 DIAGNOSIS — J432 Centrilobular emphysema: Secondary | ICD-10-CM | POA: Diagnosis not present

## 2024-01-17 DIAGNOSIS — I2510 Atherosclerotic heart disease of native coronary artery without angina pectoris: Secondary | ICD-10-CM | POA: Diagnosis not present

## 2024-01-17 DIAGNOSIS — I700 Atherosclerosis of aorta: Secondary | ICD-10-CM | POA: Insufficient documentation

## 2024-01-17 DIAGNOSIS — Z87891 Personal history of nicotine dependence: Secondary | ICD-10-CM | POA: Diagnosis not present

## 2024-01-17 MED ORDER — COMIRNATY 30 MCG/0.3ML IM SUSY
0.3000 mL | PREFILLED_SYRINGE | Freq: Once | INTRAMUSCULAR | 0 refills | Status: AC
  Filled 2024-01-17: qty 0.3, 1d supply, fill #0

## 2024-02-05 DIAGNOSIS — E041 Nontoxic single thyroid nodule: Secondary | ICD-10-CM | POA: Diagnosis not present

## 2024-03-05 ENCOUNTER — Encounter: Payer: Self-pay | Admitting: Internal Medicine

## 2024-03-13 ENCOUNTER — Other Ambulatory Visit: Payer: Self-pay | Admitting: Internal Medicine

## 2024-03-18 DIAGNOSIS — R7303 Prediabetes: Secondary | ICD-10-CM | POA: Diagnosis not present

## 2024-03-18 DIAGNOSIS — I10 Essential (primary) hypertension: Secondary | ICD-10-CM | POA: Diagnosis not present

## 2024-03-18 DIAGNOSIS — E669 Obesity, unspecified: Secondary | ICD-10-CM | POA: Diagnosis not present

## 2024-03-18 DIAGNOSIS — Z6830 Body mass index (BMI) 30.0-30.9, adult: Secondary | ICD-10-CM | POA: Diagnosis not present

## 2024-05-06 NOTE — Progress Notes (Signed)
 "   Cardiology Office Note   Date:  05/10/2024   ID:  Hannah Nielsen, DOB 26-Jul-1950, MRN [id number]  PCP:  Hannah Trula SQUIBB, MD  Cardiologist:   Vina Gull, MD   F/U of CAD and HTN    History of Present Illness: Hannah Nielsen is a 74 y.o. female with a historyof CAD, L atrial fibroelastoma, HTN, CVA  and R ACA aneurysm.    2016    Undwent removal of fibrolastoma and CABG (LIMA to LAD; SVG to RCA) as well as PFO closure     2022 Echo shows  LVEF normal  Normal valve function   Jan 2024 myoview  showed normal perfusion      I saw the pt in Jan 2025  SInce seen she has done well  NO CP  NO SOB  No dizziness  No palpitations  Working on diet  ON Ozempic    Current Meds  Medication Sig   amLODipine  (NORVASC ) 5 MG tablet TAKE 1 TABLET BY MOUTH EVERY DAY   ASPIRIN  81 PO Take 81 mg by mouth daily.    atorvastatin  (LIPITOR ) 40 MG tablet TAKE 1 TABLET BY MOUTH ONCE DAILY   KRILL OIL PO Take 1 capsule by mouth daily.   lisinopril  (ZESTRIL ) 10 MG tablet TAKE 1 TABLET BY MOUTH ONCE DAILY   loratadine -pseudoephedrine (CLARITIN -D 12 HOUR) 5-120 MG tablet Take 1 tablet by mouth as needed for allergies.   MELATONIN PO Take 10 mg by mouth daily.   metFORMIN (GLUCOPHAGE) 500 MG tablet Take 500 mg by mouth daily with breakfast.   Misc Natural Products (OSTEO BI-FLEX TRIPLE STRENGTH PO) Take by mouth.   Multiple Vitamin (MULTIVITAMIN) tablet Take 1 tablet by mouth daily.   OVER THE COUNTER MEDICATION Take by mouth in the morning and at bedtime. OSTEOBIFLEX W/ TUMERIC   polyethylene glycol (MIRALAX  / GLYCOLAX ) 17 g packet Take 17 g by mouth daily.   Semaglutide , 2 MG/DOSE, (OZEMPIC , 2 MG/DOSE,) 8 MG/3ML SOPN Inject 2 mg into the skin once a week.     Allergies:   Sulfa antibiotics   Past Medical History:  Diagnosis Date   Aneurysm of anterior cerebral artery: Right per MRI 07/01/14 07/02/2014   4.4 mm   CAD (coronary artery disease), native coronary artery: Severe 2 vessel dz per cardiac cath  07/04/14 07/04/2014   Cataract    left immature   Constipation    takes Miralax  daily as needed   GERD (gastroesophageal reflux disease)    rarely takes Tums   History of colon polyps    benign   History of kidney stones    Hyperlipidemia 07/02/2014   takes Lipitor  daily   Hypertension    takes Amlodipine  and HCTZ daily   Insomnia    Left atrial mass 07/03/2014   benign papillary fibroelastoma   Nocturia    takes Ditropan  nightly   Papillary fibroelastoma of heart    S/P resection of left atrial benign papillary fibroelastoma and CABG x 2 07/10/2014   LIMA to LAD, SVG to RCA, EVH via right thigh   Shortness of breath dyspnea    with exertion    Sleep apnea    uses cpap   Stroke (HCC) 07/01/2014   acute embolic stroke to right MCA territory   Tingling    left hand,left breast,and left hip   Urinary frequency    Urinary urgency     Past Surgical History:  Procedure Laterality Date   ABDOMINAL HYSTERECTOMY  COLONOSCOPY     CORONARY ARTERY BYPASS GRAFT N/A 07/10/2014   Procedure: CORONARY ARTERY BYPASS GRAFTING (CABG), ON PUMP, TIMES TWO, USING LEFT INTERNAL MAMMARY ARTERY, RIGHT GREATER SAPHENOUS VEIN HARVESTED ENDOSCOPICALLY;  Surgeon: Sudie VEAR Laine, MD;  Location: [facility] OR;  Service: Open Heart Surgery;  Laterality: N/A;   EXCISION OF ATRIAL MYXOMA N/A 07/10/2014   Procedure: RESECTION OF LEFT ATRIAL MASS;  Surgeon: Sudie VEAR Laine, MD;  Location: [facility] OR;  Service: Open Heart Surgery;  Laterality: N/A;   EXTRACORPOREAL SHOCK WAVE LITHOTRIPSY Left 04/13/2017   Procedure: LEFT EXTRACORPOREAL SHOCK WAVE LITHOTRIPSY (ESWL);  Surgeon: Gaston Hamilton, MD;  Location: [facility] ORS;  Service: Urology;  Laterality: Left;   LEFT HEART CATHETERIZATION WITH CORONARY ANGIOGRAM N/A 07/04/2014   Procedure: LEFT HEART CATHETERIZATION WITH CORONARY ANGIOGRAM;  Surgeon: Candyce GORMAN Reek, MD;  Location: [facility] CATH LAB;  Service: Cardiovascular;  Laterality: N/A;   RADIOLOGY WITH ANESTHESIA N/A 12/23/2014    Procedure: Stent supported coil embolization OR right ACA aneurysm;  Surgeon: Gerldine Maizes, MD;  Location: [facility]URO OR[facility];  Service: Radiology;  Laterality: N/A;   REPAIR OF PATENT FORAMEN OVALE N/A 07/10/2014   Procedure: REPAIR OF PATENT FORAMEN OVALE;  Surgeon: Sudie VEAR Laine, MD;  Location: [facility] OR;  Service: Open Heart Surgery;  Laterality: N/A;   TEE WITHOUT CARDIOVERSION N/A 07/03/2014   Procedure: TRANSESOPHAGEAL ECHOCARDIOGRAM (TEE);  Surgeon: Oneil JAYSON Parchment, MD;  Location: [facility] ENDOSCOPY;  Service: Cardiovascular;  Laterality: N/A;   TEE WITHOUT CARDIOVERSION N/A 07/10/2014   Procedure: TRANSESOPHAGEAL ECHOCARDIOGRAM (TEE);  Surgeon: Sudie VEAR Laine, MD;  Location: [facility] OR;  Service: Open Heart Surgery;  Laterality: N/A;     Social History:  The patient  reports that she quit smoking about 9 years ago. Her smoking use included cigarettes. She has never used smokeless tobacco. She reports current alcohol use of about 1.0 standard drink of alcohol per week. She reports that she does not use drugs.   Family History:  The patient's family history includes Aneurysm in her father; Breast cancer in her maternal grandmother; Cerebral aneurysm (age of onset: 90) in her father; Heart attack in her daughter, maternal grandfather, and mother; Heart failure in her mother; Hypertension in her brother, mother, and sister.    ROS:  Please see the history of present illness. All other systems are reviewed and  Negative to the above problem except as noted.    PHYSICAL EXAM: VS:  BP 137/81 (BP Location: Left Arm, Patient Position: Sitting, Cuff Size: Normal)   Pulse 71   Resp 17   Ht 5' 2 (1.575 m)   Wt 166 lb (75.3 kg)   SpO2 95%   BMI 30.36 kg/m   GEN: Obese 74 yo  in no acute distress  HEENT: normal  Neck: no JVD Cardiac: RRR; no murmurs Respiratory:  clear to auscultation GI: soft, nontender  No hepatomegaly  Ext  NO LE edema  2+ DP pulses    EKG:  EKG is ordered today.    SR 73 bpm    RBBB  LAFB    Echo    1. Left ventricular ejection fraction, by estimation, is 60 to 65%. Left  ventricular ejection fraction by 3D volume is 63 %. The left ventricle has  normal function. The left ventricle has no regional wall motion  abnormalities. Left ventricular diastolic   parameters were normal.   2. Right ventricular systolic function is normal. The right ventricular  size is normal. There is normal  pulmonary artery systolic pressure. The  estimated right ventricular systolic pressure is 27.8 mmHg.   3. The mitral valve is normal in structure. Trivial mitral valve  regurgitation. No evidence of mitral stenosis.   4. The aortic valve is tricuspid. Aortic valve regurgitation is not  visualized. Mild aortic valve sclerosis is present, with no evidence of  aortic valve stenosis.   5. The inferior vena cava is normal in size with greater than 50%  respiratory variability, suggesting right atrial pressure of 3 mmHg.   6. S/P LA myoxma resection.   7. S/P PFO repair with no evidence of shunt by coloflow doppler.   Myoview  May 2021 There was no ST segment deviation noted during stress. The left ventricular ejection fraction is hyperdynamic (>65%). Nuclear stress EF: 70%. The study is normal. This is a low risk study.  Lipid Panel    Component Value Date/Time   CHOL 120 07/16/2020 0926   TRIG 142 07/16/2020 0926   HDL 39 (L) 07/16/2020 0926   CHOLHDL 3.1 07/16/2020 0926   CHOLHDL 2.5 01/04/2016 1004   VLDL 26 01/04/2016 1004   LDLCALC 56 07/16/2020 0926      Wt Readings from Last 3 Encounters:  05/10/24 166 lb (75.3 kg)  06/13/23 172 lb 12.8 oz (78.4 kg)  05/02/23 175 lb (79.4 kg)      ASSESSMENT AND PLAN:  1  CAD   CABG in 2016 (LIMA to LAD; SVG to RCA)  Pt without symptoms   Follow  Rx risk factors   2.  HTN  BP has been good  Sl high today   Her sister just died this AM  Follow  Goal 110s to 130   FOllow    3 HL  Lipids are excellent  In September 2025 LDL  53  HDL 49  Trig 154    Keep on statin   LImit carbs  4  Obesity  Congratulated on losses   Keep it up    Follow up in 1 year  Sooner for problems    Current medicines are reviewed at length with the patient today.  The patient does not have concerns regarding medicines.  Signed, Vina Gull, MD  "

## 2024-05-10 ENCOUNTER — Encounter: Payer: Self-pay | Admitting: Internal Medicine

## 2024-05-10 ENCOUNTER — Ambulatory Visit: Attending: Internal Medicine | Admitting: Internal Medicine

## 2024-05-10 VITALS — BP 137/81 | HR 71 | Resp 17 | Ht 62.0 in | Wt 166.0 lb

## 2024-05-10 DIAGNOSIS — I2510 Atherosclerotic heart disease of native coronary artery without angina pectoris: Secondary | ICD-10-CM

## 2024-05-10 DIAGNOSIS — R0602 Shortness of breath: Secondary | ICD-10-CM | POA: Diagnosis not present

## 2024-06-18 ENCOUNTER — Telehealth: Admitting: Adult Health
